# Patient Record
Sex: Male | Born: 1955 | Race: White | Hispanic: No | Marital: Married | Smoking: Current every day smoker
Health system: Southern US, Community
[De-identification: ages and names within clinical notes are randomized; demographics above are authoritative.]

## PROBLEM LIST (undated history)

## (undated) DIAGNOSIS — N341 Nonspecific urethritis: Secondary | ICD-10-CM

## (undated) DIAGNOSIS — Z860100 Personal history of colon polyps, unspecified: Secondary | ICD-10-CM

## (undated) DIAGNOSIS — E039 Hypothyroidism, unspecified: Secondary | ICD-10-CM

## (undated) DIAGNOSIS — Z720 Tobacco use: Secondary | ICD-10-CM

## (undated) DIAGNOSIS — K219 Gastro-esophageal reflux disease without esophagitis: Secondary | ICD-10-CM

## (undated) DIAGNOSIS — E119 Type 2 diabetes mellitus without complications: Secondary | ICD-10-CM

## (undated) DIAGNOSIS — E785 Hyperlipidemia, unspecified: Secondary | ICD-10-CM

## (undated) DIAGNOSIS — J449 Chronic obstructive pulmonary disease, unspecified: Secondary | ICD-10-CM

## (undated) HISTORY — DX: Tobacco use: Z72.0

## (undated) HISTORY — PX: TOOTH EXTRACTION: SUR596

## (undated) HISTORY — PX: HERNIA REPAIR: SHX51

## (undated) HISTORY — DX: Hyperlipidemia, unspecified: E78.5

## (undated) HISTORY — DX: Hypothyroidism, unspecified: E03.9

## (undated) HISTORY — DX: Gastro-esophageal reflux disease without esophagitis: K21.9

## (undated) HISTORY — DX: Personal history of colonic polyps: Z86.010

## (undated) HISTORY — DX: Personal history of colon polyps, unspecified: Z86.0100

## (undated) HISTORY — DX: Nonspecific urethritis: N34.1

---

## 2005-05-05 HISTORY — PX: OTHER SURGICAL HISTORY: SHX169

## 2006-05-05 HISTORY — PX: COLONOSCOPY: SHX174

## 2009-07-20 ENCOUNTER — Ambulatory Visit: Payer: Self-pay | Admitting: Internal Medicine

## 2009-07-20 DIAGNOSIS — E785 Hyperlipidemia, unspecified: Secondary | ICD-10-CM

## 2009-07-20 DIAGNOSIS — Z860100 Personal history of colon polyps, unspecified: Secondary | ICD-10-CM | POA: Insufficient documentation

## 2009-07-20 DIAGNOSIS — E039 Hypothyroidism, unspecified: Secondary | ICD-10-CM

## 2009-10-12 ENCOUNTER — Ambulatory Visit: Payer: Self-pay | Admitting: Internal Medicine

## 2009-10-12 DIAGNOSIS — F458 Other somatoform disorders: Secondary | ICD-10-CM

## 2009-10-12 DIAGNOSIS — N341 Nonspecific urethritis: Secondary | ICD-10-CM

## 2009-10-12 HISTORY — DX: Nonspecific urethritis: N34.1

## 2009-10-12 LAB — CONVERTED CEMR LAB
Glucose, Urine, Semiquant: NEGATIVE
Ketones, urine, test strip: NEGATIVE
Nitrite: NEGATIVE
Specific Gravity, Urine: >=1.03
pH: 5

## 2010-01-14 ENCOUNTER — Ambulatory Visit: Payer: Self-pay | Admitting: Internal Medicine

## 2010-01-14 LAB — CONVERTED CEMR LAB
Albumin: 4.2 g/dL (ref 3.5–5.2)
Alkaline Phosphatase: 42 units/L (ref 39–117)
Basophils Relative: 0.8 % (ref 0.0–3.0)
CO2: 28 meq/L (ref 19–32)
Chloride: 101 meq/L (ref 96–112)
Direct LDL: 106.2 mg/dL
Eosinophils Absolute: 0.3 10*3/uL (ref 0.0–0.7)
Glucose, Bld: 98 mg/dL (ref 70–99)
HCT: 46.3 % (ref 39.0–52.0)
Hemoglobin: 15.9 g/dL (ref 13.0–17.0)
Lymphocytes Relative: 19.7 % (ref 12.0–46.0)
Lymphs Abs: 1.4 10*3/uL (ref 0.7–4.0)
MCHC: 34.4 g/dL (ref 30.0–36.0)
MCV: 92.5 fL (ref 78.0–100.0)
Monocytes Absolute: 0.5 10*3/uL (ref 0.1–1.0)
Neutro Abs: 4.8 10*3/uL (ref 1.4–7.7)
Nitrite: NEGATIVE
Potassium: 4.4 meq/L (ref 3.5–5.1)
RBC: 5 M/uL (ref 4.22–5.81)
Sodium: 139 meq/L (ref 135–145)
Specific Gravity, Urine: 1.025
TSH: 3.1 microintl units/mL (ref 0.35–5.50)
Total CHOL/HDL Ratio: 5
Total Protein: 6.7 g/dL (ref 6.0–8.3)
Urobilinogen, UA: 0.2

## 2010-01-21 ENCOUNTER — Ambulatory Visit: Payer: Self-pay | Admitting: Internal Medicine

## 2010-01-21 DIAGNOSIS — K219 Gastro-esophageal reflux disease without esophagitis: Secondary | ICD-10-CM

## 2010-06-04 NOTE — Assessment & Plan Note (Signed)
Summary: CPX // RS   Vital Signs:  Patient profile:   55 year old male Height:      72 inches Weight:      222 pounds BMI:     30.22 Temp:     98.1 degrees F oral BP sitting:   108 / 70  (left arm) Cuff size:   regular  Vitals Entered By: Duard Brady LPN (January 21, 2010 7:57 AM) CC: cpx - doing well Is Patient Diabetic? No   CC:  cpx - doing well.  History of Present Illness: 55 year old patient who is seen today for a wellness exam.  Medical problems include dyslipidemia, hypothyroidism, and a history of colonic polyps.  He is a well today except for some intermittent reflux symptoms.  He has been treated with protonic since the past.  Allergies (verified): No Known Drug Allergies  Past History:  Past Medical History: Hyperlipidemia Hypothyroidism tobacco abuse Colonic polyps, hx of GERD  Past Surgical History: Reviewed history from 07/20/2009 and no changes required. colonoscopy 2008 ETT 2007  Family History: Reviewed history from 07/20/2009 and no changes required. father age 6, coronary artery disease, status post stent mother, age 64, history of cardiac surgery, possibly for a congenital heart defect 17 year old brother in good health one half-sister in good health  Social History: Reviewed history from 07/20/2009 and no changes required. Married Current Smoker three  children-   Review of Systems  The patient denies anorexia, fever, weight loss, weight gain, vision loss, decreased hearing, hoarseness, chest pain, syncope, dyspnea on exertion, peripheral edema, prolonged cough, headaches, hemoptysis, abdominal pain, melena, hematochezia, severe indigestion/heartburn, hematuria, incontinence, genital sores, muscle weakness, suspicious skin lesions, transient blindness, difficulty walking, depression, unusual weight change, abnormal bleeding, enlarged lymph nodes, angioedema, breast masses, and testicular masses.    Physical Exam  General:   Well-developed,well-nourished,in no acute distress; alert,appropriate and cooperative throughout examination Head:  Normocephalic and atraumatic without obvious abnormalities. No apparent alopecia or balding. Eyes:  No corneal or conjunctival inflammation noted. EOMI. Perrla. Funduscopic exam benign, without hemorrhages, exudates or papilledema. Vision grossly normal. Ears:  External ear exam shows no significant lesions or deformities.  Otoscopic examination reveals clear canals, tympanic membranes are intact bilaterally without bulging, retraction, inflammation or discharge. Hearing is grossly normal bilaterally. Nose:  External nasal examination shows no deformity or inflammation. Nasal mucosa are pink and moist without lesions or exudates. Mouth:  Oral mucosa and oropharynx without lesions or exudates.  Teeth in good repair. Neck:  No deformities, masses, or tenderness noted. Chest Wall:  No deformities, masses, tenderness or gynecomastia noted. Breasts:  No masses or gynecomastia noted Lungs:  Normal respiratory effort, chest expands symmetrically. Lungs are clear to auscultation, no crackles or wheezes. Heart:  Normal rate and regular rhythm. S1 and S2 normal without gallop, murmur, click, rub or other extra sounds. Abdomen:  Bowel sounds positive,abdomen soft and non-tender without masses, organomegaly or hernias noted. Rectal:  No external abnormalities noted. Normal sphincter tone. No rectal masses or tenderness. Genitalia:  Testes bilaterally descended without nodularity, tenderness or masses. No scrotal masses or lesions. No penis lesions or urethral discharge. Prostate:  Prostate gland firm and smooth, no enlargement, nodularity, tenderness, mass, asymmetry or induration. Msk:  No deformity or scoliosis noted of thoracic or lumbar spine.   Pulses:  R and L carotid,radial,femoral,dorsalis pedis and posterior tibial pulses are full and equal bilaterally Extremities:  No clubbing, cyanosis,  edema, or deformity noted with normal full range of motion of all  joints.   Neurologic:  No cranial nerve deficits noted. Station and gait are normal. Plantar reflexes are down-going bilaterally. DTRs are symmetrical throughout. Sensory, motor and coordinative functions appear intact. Skin:  Intact without suspicious lesions or rashes Cervical Nodes:  No lymphadenopathy noted Axillary Nodes:  No palpable lymphadenopathy Inguinal Nodes:  No significant adenopathy Psych:  Cognition and judgment appear intact. Alert and cooperative with normal attention span and concentration. No apparent delusions, illusions, hallucinations   Impression & Recommendations:  Problem # 1:  PREVENTIVE HEALTH CARE (ICD-V70.0)  Complete Medication List: 1)  Simvastatin 40 Mg Tabs (Simvastatin) .... Qd 2)  Levothyroxine Sodium 137 Mcg Tabs (Levothyroxine sodium) .... Qd 3)  Pantoprazole Sodium 40 Mg Tbec (Pantoprazole sodium) .... One daily, as needed for reflux symptoms  Patient Instructions: 1)  Please schedule a follow-up appointment in 1 year. 2)  Limit your Sodium (Salt) to less than 2 grams a day(slightly less than 1/2 a teaspoon) to prevent fluid retention, swelling, or worsening of symptoms. 3)  It is important that you exercise regularly at least 20 minutes 5 times a week. If you develop chest pain, have severe difficulty breathing, or feel very tired , stop exercising immediately and seek medical attention. Prescriptions: PANTOPRAZOLE SODIUM 40 MG TBEC (PANTOPRAZOLE SODIUM) one daily, as needed for reflux symptoms  #90 x 6   Entered and Authorized by:   Gordy Savers  MD   Signed by:   Gordy Savers  MD on 01/21/2010   Method used:   Print then Give to Patient   RxID:   [id number] LEVOTHYROXINE SODIUM 137 MCG TABS (LEVOTHYROXINE SODIUM) qd  #90 x 6   Entered and Authorized by:   Gordy Savers  MD   Signed by:   Gordy Savers  MD on 01/21/2010   Method used:   Print then  Give to Patient   RxID:   [id number] SIMVASTATIN 40 MG TABS (SIMVASTATIN) qd  #90 x 6   Entered and Authorized by:   Gordy Savers  MD   Signed by:   Gordy Savers  MD on 01/21/2010   Method used:   Print then Give to Patient   RxID:   [id number] PANTOPRAZOLE SODIUM 40 MG TBEC (PANTOPRAZOLE SODIUM) one daily, as needed for reflux symptoms  #90 x 6   Entered and Authorized by:   Gordy Savers  MD   Signed by:   Gordy Savers  MD on 01/21/2010   Method used:   [facility]ron[facility] to        [facility] [phone][facility])       [address]       [place], [place]  [address]       Ph: [phone] or [phone]       Fax: [phone]   RxID:   [id number] LEVOTHYROXINE SODIUM 137 MCG TABS (LEVOTHYROXINE SODIUM) qd  #90 x 6   Entered and Authorized by:   Gordy Savers  MD   Signed by:   Gordy Savers  MD on 01/21/2010   Method used:   [facility] to        [facility] [phone]* (retail)       [address]       [place], [place]  [address]       Ph: [phone] or [phone]  Fax: [phone]   RxID:   [id number] SIMVASTATIN 40 MG TABS (SIMVASTATIN) qd  #90 x 6   Entered and Authorized by:   Gordy Savers  MD   Signed by:   Gordy Savers  MD on 01/21/2010   Method used:   [facility]        [facility] [phone]* (retail)       [address]       [place], [place]  [address]       Ph: [phone] or [phone]       Fax: [phone]   RxID:   [id number]

## 2010-06-04 NOTE — Assessment & Plan Note (Signed)
Summary: TO BE EST/NJR   Vital Signs:  Patient profile:   55 year old male Height:      72 inches Weight:      215 pounds BMI:     29.26 Temp:     97.8 degrees F oral BP sitting:   120 / 74  (right arm) Cuff size:   regular  Vitals Entered By: Duard Brady LPN (July 20, 2009 1:02 PM) CC: new pt to establish - moved from [place] Is Patient Diabetic? No   CC:  new pt to establish - moved from [place].  History of Present Illness: 55 year old patient who is seen today for an initial examinationthe medical pons include hypothyroidism and dyslipidemia.  He has ongoing tobacco use.  No concerns or complaints today  Preventive Screening-Counseling & Management  Alcohol-Tobacco     Smoking Status: current     Smoking Cessation Counseling: yes  Allergies (verified): No Known Drug Allergies  Past History:  Past Medical History: Hyperlipidemia Hypothyroidism tobacco abuse Colonic polyps, hx of  Past Surgical History: colonoscopy 2008 ETT 2007  Family History: Reviewed history and no changes required. father age 64, coronary artery disease, status post stent mother, age 73, history of cardiac surgery, possibly for a congenital and mild to 57 year old brother in good health one half-sister in good health  Social History: Reviewed history and no changes required. Married Current Smoker two childrenSmoking Status:  current  Review of Systems  The patient denies anorexia, fever, weight loss, weight gain, vision loss, decreased hearing, hoarseness, chest pain, syncope, dyspnea on exertion, peripheral edema, prolonged cough, headaches, hemoptysis, abdominal pain, melena, hematochezia, severe indigestion/heartburn, hematuria, incontinence, genital sores, muscle weakness, suspicious skin lesions, transient blindness, difficulty walking, depression, unusual weight change, abnormal bleeding, enlarged lymph nodes, angioedema, breast masses, and testicular masses.     Physical Exam  General:  overweight-appearing.  normal blood pressureoverweight-appearing.   Head:  Normocephalic and atraumatic without obvious abnormalities. No apparent alopecia or balding. Eyes:  No corneal or conjunctival inflammation noted. EOMI. Perrla. Funduscopic exam benign, without hemorrhages, exudates or papilledema. Vision grossly normal. Ears:  External ear exam shows no significant lesions or deformities.  Otoscopic examination reveals clear canals, tympanic membranes are intact bilaterally without bulging, retraction, inflammation or discharge. Hearing is grossly normal bilaterally. Nose:  External nasal examination shows no deformity or inflammation. Nasal mucosa are pink and moist without lesions or exudates. Mouth:  Oral mucosa and oropharynx without lesions or exudates.  Teeth in good repair. Neck:  No deformities, masses, or tenderness noted. Chest Wall:  No deformities, masses, tenderness or gynecomastia noted. Breasts:  No masses or gynecomastia noted Lungs:  Normal respiratory effort, chest expands symmetrically. Lungs are clear to auscultation, no crackles or wheezes. Heart:  Normal rate and regular rhythm. S1 and S2 normal without gallop, murmur, click, rub or other extra sounds. Abdomen:  Bowel sounds positive,abdomen soft and non-tender without masses, organomegaly or hernias noted. Genitalia:  Testes bilaterally descended without nodularity, tenderness or masses. No scrotal masses or lesions. No penis lesions or urethral discharge. Msk:  tattoos both upper arms Pulses:  R and L carotid,radial,femoral,dorsalis pedis and posterior tibial pulses are full and equal bilaterally Extremities:  No clubbing, cyanosis, edema, or deformity noted with normal full range of motion of all joints.   Skin:   tattoos both upper arms Cervical Nodes:  No lymphadenopathy noted Axillary Nodes:  No palpable lymphadenopathy Inguinal Nodes:  No significant adenopathy Psych:  Cognition  and judgment appear  intact. Alert and cooperative with normal attention span and concentration. No apparent delusions, illusions, hallucinations   Impression & Recommendations:  Problem # 1:  Preventive Health Care (ICD-V70.0)  Complete Medication List: 1)  Simvastatin 40 Mg Tabs (Simvastatin) .... Qd 2)  Levothyroxine Sodium 137 Mcg Tabs (Levothyroxine sodium) .... Qd  Patient Instructions: 1)  Please schedule a follow-up appointment in 6 months. 2)  Advised not to eat any food or drink any liquids after 10 PM the night before your procedure. 3)  Tobacco is very bad for your health and your loved ones! You Should stop smoking!. 4)  It is important that you exercise regularly at least 20 minutes 5 times a week. If you develop chest pain, have severe difficulty breathing, or feel very tired , stop exercising immediately and seek medical attention. 5)  You need to lose weight. Consider a lower calorie diet and regular exercise.  Prescriptions: LEVOTHYROXINE SODIUM 137 MCG TABS (LEVOTHYROXINE SODIUM) qd  #90 x 6   Entered and Authorized by:   Gordy Savers  MD   Signed by:   Gordy Savers  MD on 07/20/2009   Method used:   Print then Give to Patient   RxID:   [id number] SIMVASTATIN 40 MG TABS (SIMVASTATIN) qd  #90 x 6   Entered and Authorized by:   Gordy Savers  MD   Signed by:   Gordy Savers  MD on 07/20/2009   Method used:   Print then Give to Patient   RxID:   [id number]

## 2010-06-04 NOTE — Assessment & Plan Note (Signed)
Summary: uti/dm   Vital Signs:  Turner profile:   55 year old male Weight:      223 pounds Temp:     98.4 degrees F oral BP sitting:   120 / 84  (right arm) Cuff size:   regular  Vitals Entered By: Duard Brady LPN (October 12, 2009 4:09 PM) CC: c/o ??uti , irriatation to tip of penis Is Turner Diabetic? No   CC:  c/o ??uti  and irriatation to tip of penis.  History of Present Illness: Tracy Turner, who presents with a several-day history of irritation of the distal urethra associated with a scanty urethral discharge.  He has been in a monogamous relationship for over 20 years.  No prior history of STD, although he states he was treated for a UTI many years ago.  Denies any drug allergies.  He has hypothyroidism and dyslipidemia.  Preventive Screening-Counseling & Management  Alcohol-Tobacco     Smoking Status: current  Allergies (verified): No Known Drug Allergies  Past History:  Past Medical History: Reviewed history from 07/20/2009 and no changes required. Hyperlipidemia Hypothyroidism tobacco abuse Colonic polyps, hx of  Physical Exam  General:  Well-developed,well-nourished,in no acute distress; alert,appropriate and cooperative throughout examination; 120/84 Genitalia:  distal urethra, slightly erythematous with a scanty discharge   Impression & Recommendations:  Problem # 1:  NONGONOCOCCAL URETHRITIS (ICD-099.40)  Orders: Prescription Created Electronically [phone])  Problem # 2:  HYPERLIPIDEMIA (ICD-272.4)  His updated medication list for this problem includes:    Simvastatin 40 Mg Tabs (Simvastatin) ..... Qd  Complete Medication List: 1)  Simvastatin 40 Mg Tabs (Simvastatin) .... Qd 2)  Levothyroxine Sodium 137 Mcg Tabs (Levothyroxine sodium) .... Qd 3)  Doxycycline Hyclate 100 Mg Tabs (Doxycycline hyclate) .... One twice daily  Other Orders: UA Dipstick w/o Micro (manual) ([id number])  Turner Instructions: 1)  Please schedule a  follow-up appointment in 6 months. 2)  Take your antibiotic as prescribed until ALL of it is gone, but stop if you develop a rash or swelling and contact our office as soon as possible. Prescriptions: DOXYCYCLINE HYCLATE 100 MG TABS (DOXYCYCLINE HYCLATE) one twice daily  #20 x 0   Entered and Authorized by:   Gordy Savers  MD   Signed by:   Gordy Savers  MD on 10/12/2009   Method used:   Print then Give to Turner   RxID:   [id number] DOXYCYCLINE HYCLATE 100 MG TABS (DOXYCYCLINE HYCLATE) one twice daily  #20 x 0   Entered and Authorized by:   Gordy Savers  MD   Signed by:   Gordy Savers  MD on 10/12/2009   Method used:   [facility]        [phone][facility] (retail[facility]       [address]       [place], [place]  [address]       Ph: [phone] or [phone]       Fax: [phone]   RxID:   [id number]   Laboratory Results   Urine Tests    Routine Urinalysis   Color: yellow Appearance: Hazy Glucose: negative   (Normal Range: Negative) Bilirubin: negative   (Normal Range: Negative) Ketone: negative   (Normal Range: Negative) Spec. Gravity: >=1.030   (Normal Range: 1.003-1.035) Blood: trace-lysed   (Normal Range: Negative) pH: 5.0   (Normal Range: 5.0-8.0) Protein: trace   (Normal Range: Negative) Urobilinogen: 0.2   (Normal Range: 0-1) Nitrite: negative   (Normal Range:  Negative) Leukocyte Esterace: trace   (Normal Range: Negative)

## 2011-02-03 ENCOUNTER — Other Ambulatory Visit: Payer: Self-pay | Admitting: Internal Medicine

## 2011-02-10 ENCOUNTER — Other Ambulatory Visit: Payer: Self-pay

## 2011-02-10 MED ORDER — PANTOPRAZOLE SODIUM 40 MG PO TBEC: 40.0000 mg | DELAYED_RELEASE_TABLET | Freq: Every day | ORAL | Status: DC

## 2011-02-10 MED ORDER — SIMVASTATIN 40 MG PO TABS: 40.0000 mg | ORAL_TABLET | Freq: Every evening | ORAL | Status: DC

## 2011-03-19 ENCOUNTER — Encounter: Payer: Self-pay | Admitting: Internal Medicine

## 2011-03-20 ENCOUNTER — Ambulatory Visit (INDEPENDENT_AMBULATORY_CARE_PROVIDER_SITE_OTHER): Payer: 59 | Admitting: Internal Medicine

## 2011-03-20 ENCOUNTER — Encounter: Payer: Self-pay | Admitting: Internal Medicine

## 2011-03-20 DIAGNOSIS — E785 Hyperlipidemia, unspecified: Secondary | ICD-10-CM

## 2011-03-20 DIAGNOSIS — K219 Gastro-esophageal reflux disease without esophagitis: Secondary | ICD-10-CM

## 2011-03-20 DIAGNOSIS — E039 Hypothyroidism, unspecified: Secondary | ICD-10-CM

## 2011-03-20 MED ORDER — PANTOPRAZOLE SODIUM 40 MG PO TBEC: 40.0000 mg | DELAYED_RELEASE_TABLET | Freq: Every day | ORAL | Status: DC

## 2011-03-20 MED ORDER — LEVOTHYROXINE SODIUM 137 MCG PO TABS: 137.0000 ug | ORAL_TABLET | Freq: Every day | ORAL | Status: DC

## 2011-03-20 MED ORDER — SIMVASTATIN 40 MG PO TABS: 40.0000 mg | ORAL_TABLET | Freq: Every evening | ORAL | Status: DC

## 2011-03-20 NOTE — Patient Instructions (Signed)
Limit your sodium (Salt) intake  Avoids foods high in acid such as tomatoes citrus juices, and spicy foods.  Avoid eating within two hours of lying down or before exercising.  Do not overheat.  Try smaller more frequent meals.  If symptoms persist, elevate the head of her bed 12 inches while sleeping.  Annual exam in January as scheduled

## 2011-03-20 NOTE — Progress Notes (Signed)
  Subjective:    Patient ID: Tracy Turner, male    DOB: [birth year], 55 y.o.   MRN: [id number]  HPI  55 year old patient who is seen today for followup. He has a history of dyslipidemia hypothyroidism and gastroesophageal reflux disease. He has been scheduled for a physical early next year. He also has a history of colonic polyps and will require followup colonoscopy at that time he is doing quite well today and is seen today basically for a medication refill. He has had a flu vaccine at work.    Review of Systems  Constitutional: Negative for fever, chills, appetite change and fatigue.  HENT: Negative for hearing loss, ear pain, congestion, sore throat, trouble swallowing, neck stiffness, dental problem, voice change and tinnitus.   Eyes: Negative for pain, discharge and visual disturbance.  Respiratory: Negative for cough, chest tightness, wheezing and stridor.   Cardiovascular: Negative for chest pain, palpitations and leg swelling.  Gastrointestinal: Negative for nausea, vomiting, abdominal pain, diarrhea, constipation, blood in stool and abdominal distention.  Genitourinary: Negative for urgency, hematuria, flank pain, discharge, difficulty urinating and genital sores.  Musculoskeletal: Negative for myalgias, back pain, joint swelling, arthralgias and gait problem.  Skin: Negative for rash.  Neurological: Negative for dizziness, syncope, speech difficulty, weakness, numbness and headaches.  Hematological: Negative for adenopathy. Does not bruise/bleed easily.  Psychiatric/Behavioral: Negative for behavioral problems and dysphoric mood. The patient is not nervous/anxious.        Objective:   Physical Exam  Constitutional: He is oriented to person, place, and time. He appears well-developed.  HENT:  Head: Normocephalic.  Right Ear: External ear normal.  Left Ear: External ear normal.  Eyes: Conjunctivae and EOM are normal.  Neck: Normal range of motion.  Cardiovascular: Normal rate  and normal heart sounds.   Pulmonary/Chest: Breath sounds normal.  Abdominal: Bowel sounds are normal.  Musculoskeletal: Normal range of motion. He exhibits no edema and no tenderness.  Neurological: He is alert and oriented to person, place, and time.  Psychiatric: He has a normal mood and affect. His behavior is normal.          Assessment & Plan:   Hypothyroidism. We'll check a TSH at that time his physical Dyslipidemia stable followup lipid panel at the time of his physical Gastroesophageal reflux disease stable. Medications refilled

## 2011-04-24 ENCOUNTER — Other Ambulatory Visit: Payer: Self-pay | Admitting: Internal Medicine

## 2011-05-08 ENCOUNTER — Encounter: Payer: 59 | Admitting: Internal Medicine

## 2011-05-12 ENCOUNTER — Other Ambulatory Visit (INDEPENDENT_AMBULATORY_CARE_PROVIDER_SITE_OTHER): Payer: 59

## 2011-05-12 DIAGNOSIS — Z0000 Encounter for general adult medical examination without abnormal findings: Secondary | ICD-10-CM

## 2011-05-12 LAB — LDL CHOLESTEROL, DIRECT: Direct LDL: 95.7 mg/dL

## 2011-05-12 LAB — POCT URINALYSIS DIPSTICK
Glucose, UA: NEGATIVE
Nitrite, UA: NEGATIVE
Spec Grav, UA: 1.025
Urobilinogen, UA: 0.2

## 2011-05-12 LAB — CBC WITH DIFFERENTIAL/PLATELET
Basophils Relative: 0.4 % (ref 0.0–3.0)
Eosinophils Relative: 3.1 % (ref 0.0–5.0)
HCT: 47.7 % (ref 39.0–52.0)
Hemoglobin: 16.2 g/dL (ref 13.0–17.0)
Lymphs Abs: 1.4 10*3/uL (ref 0.7–4.0)
MCV: 92.8 fl (ref 78.0–100.0)
Monocytes Absolute: 0.4 10*3/uL (ref 0.1–1.0)
Neutro Abs: 5.7 10*3/uL (ref 1.4–7.7)
RBC: 5.14 Mil/uL (ref 4.22–5.81)
WBC: 7.8 10*3/uL (ref 4.5–10.5)

## 2011-05-12 LAB — BASIC METABOLIC PANEL
CO2: 29 mEq/L (ref 19–32)
Calcium: 10.1 mg/dL (ref 8.4–10.5)
Creatinine, Ser: 1.2 mg/dL (ref 0.4–1.5)

## 2011-05-12 LAB — LIPID PANEL
Cholesterol: 171 mg/dL (ref 0–200)
Total CHOL/HDL Ratio: 4

## 2011-05-12 LAB — TSH: TSH: 3.71 u[IU]/mL (ref 0.35–5.50)

## 2011-05-13 LAB — HEPATIC FUNCTION PANEL
ALT: 30 U/L (ref 0–53)
Albumin: 4.5 g/dL (ref 3.5–5.2)
Bilirubin, Direct: 0.1 mg/dL (ref 0.0–0.3)
Total Protein: 7.1 g/dL (ref 6.0–8.3)

## 2011-05-19 ENCOUNTER — Encounter: Payer: Self-pay | Admitting: Internal Medicine

## 2011-05-19 ENCOUNTER — Ambulatory Visit (INDEPENDENT_AMBULATORY_CARE_PROVIDER_SITE_OTHER): Payer: 59 | Admitting: Internal Medicine

## 2011-05-19 DIAGNOSIS — Z860100 Personal history of colon polyps, unspecified: Secondary | ICD-10-CM

## 2011-05-19 DIAGNOSIS — R7309 Other abnormal glucose: Secondary | ICD-10-CM

## 2011-05-19 DIAGNOSIS — E785 Hyperlipidemia, unspecified: Secondary | ICD-10-CM

## 2011-05-19 DIAGNOSIS — R7302 Impaired glucose tolerance (oral): Secondary | ICD-10-CM

## 2011-05-19 DIAGNOSIS — E039 Hypothyroidism, unspecified: Secondary | ICD-10-CM

## 2011-05-19 MED ORDER — SIMVASTATIN 40 MG PO TABS: 40.0000 mg | ORAL_TABLET | Freq: Every day | ORAL | Status: DC

## 2011-05-19 MED ORDER — VARENICLINE TARTRATE 0.5 MG PO TABS: 0.5000 mg | ORAL_TABLET | Freq: Two times a day (BID) | ORAL | Status: AC

## 2011-05-19 MED ORDER — LEVOTHYROXINE SODIUM 137 MCG PO TABS: 137.0000 ug | ORAL_TABLET | Freq: Every day | ORAL | Status: DC

## 2011-05-19 MED ORDER — PANTOPRAZOLE SODIUM 40 MG PO TBEC: 40.0000 mg | DELAYED_RELEASE_TABLET | Freq: Every day | ORAL | Status: DC

## 2011-05-19 NOTE — Progress Notes (Signed)
  Subjective:    Patient ID: Tracy Turner, male    DOB: 24-Jul-1955, 56 y.o.   MRN: [id number]  HPI  56 year old patient who is seen today for a health maintenance examination. He has a history of dyslipidemia ongoing tobacco use and gastroesophageal reflux disease. He also had multiple colonic polyps in 2007. He has treated hypothyroidism. He has a history of ongoing tobacco use and has been successful with discontinuation with Chantix therapy in the past    Review of Systems  Constitutional: Negative for fever, chills, activity change, appetite change and fatigue.  HENT: Negative for hearing loss, ear pain, congestion, rhinorrhea, sneezing, mouth sores, trouble swallowing, neck pain, neck stiffness, dental problem, voice change, sinus pressure and tinnitus.   Eyes: Negative for photophobia, pain, redness and visual disturbance.  Respiratory: Negative for apnea, cough, choking, chest tightness, shortness of breath and wheezing.   Cardiovascular: Negative for chest pain, palpitations and leg swelling.  Gastrointestinal: Negative for nausea, vomiting, abdominal pain, diarrhea, constipation, blood in stool, abdominal distention, anal bleeding and rectal pain.  Genitourinary: Negative for dysuria, urgency, frequency, hematuria, flank pain, decreased urine volume, discharge, penile swelling, scrotal swelling, difficulty urinating, genital sores and testicular pain.  Musculoskeletal: Negative for myalgias, back pain, joint swelling, arthralgias and gait problem.  Skin: Negative for color change, rash and wound.  Neurological: Negative for dizziness, tremors, seizures, syncope, facial asymmetry, speech difficulty, weakness, light-headedness, numbness and headaches.  Hematological: Negative for adenopathy. Does not bruise/bleed easily.  Psychiatric/Behavioral: Negative for suicidal ideas, hallucinations, behavioral problems, confusion, sleep disturbance, self-injury, dysphoric mood, decreased  concentration and agitation. The patient is not nervous/anxious.        Objective:   Physical Exam  Constitutional: He appears well-developed and well-nourished.  HENT:  Head: Normocephalic and atraumatic.  Right Ear: External ear normal.  Left Ear: External ear normal.  Nose: Nose normal.  Mouth/Throat: Oropharynx is clear and moist.  Eyes: Conjunctivae and EOM are normal. Pupils are equal, round, and reactive to light. No scleral icterus.  Neck: Normal range of motion. Neck supple. No JVD present. No thyromegaly present.  Cardiovascular: Regular rhythm, normal heart sounds and intact distal pulses.  Exam reveals no gallop and no friction rub.   No murmur heard. Pulmonary/Chest: Effort normal and breath sounds normal. He exhibits no tenderness.  Abdominal: Soft. Bowel sounds are normal. He exhibits no distension and no mass. There is no tenderness.  Genitourinary: Prostate normal and penis normal.  Musculoskeletal: Normal range of motion. He exhibits no edema and no tenderness.  Lymphadenopathy:    He has no cervical adenopathy.  Neurological: He is alert. He has normal reflexes. No cranial nerve deficit. Coordination normal.  Skin: Skin is warm and dry. No rash noted.  Psychiatric: He has a normal mood and affect. His behavior is normal.          Assessment & Plan:   Preventive health examination Ongoing tobacco use. Will retreat with Chantix Dyslipidemia will continue on Zocor Impaired glucose tolerance weight loss exercise encouraged Clinical polyps. We'll schedule followup colonoscopy

## 2011-05-19 NOTE — Patient Instructions (Signed)
Smoking tobacco is very bad for your health. You should stop smoking immediately.  Limit your sodium (Salt) intake    It is important that you exercise regularly, at least 20 minutes 3 to 4 times per week.  If you develop chest pain or shortness of breath seek  medical attention.  Schedule your colonoscopy to help detect colon cancer.

## 2012-02-24 ENCOUNTER — Other Ambulatory Visit: Payer: Self-pay | Admitting: Internal Medicine

## 2012-03-06 ENCOUNTER — Other Ambulatory Visit: Payer: Self-pay | Admitting: Internal Medicine

## 2012-04-09 ENCOUNTER — Other Ambulatory Visit: Payer: Self-pay | Admitting: Internal Medicine

## 2012-05-30 ENCOUNTER — Other Ambulatory Visit: Payer: Self-pay | Admitting: Internal Medicine

## 2012-07-20 ENCOUNTER — Other Ambulatory Visit (INDEPENDENT_AMBULATORY_CARE_PROVIDER_SITE_OTHER): Payer: 59

## 2012-07-20 DIAGNOSIS — Z0000 Encounter for general adult medical examination without abnormal findings: Secondary | ICD-10-CM

## 2012-07-20 LAB — LDL CHOLESTEROL, DIRECT: Direct LDL: 80.8 mg/dL

## 2012-07-20 LAB — CBC WITH DIFFERENTIAL/PLATELET
Eosinophils Relative: 2.5 % (ref 0.0–5.0)
HCT: 45.6 % (ref 39.0–52.0)
Hemoglobin: 15.4 g/dL (ref 13.0–17.0)
Lymphocytes Relative: 18.8 % (ref 12.0–46.0)
Lymphs Abs: 1.4 10*3/uL (ref 0.7–4.0)
Monocytes Relative: 6.1 % (ref 3.0–12.0)
Neutro Abs: 5.3 10*3/uL (ref 1.4–7.7)
WBC: 7.4 10*3/uL (ref 4.5–10.5)

## 2012-07-20 LAB — POCT URINALYSIS DIPSTICK
Ketones, UA: NEGATIVE
Leukocytes, UA: NEGATIVE
Nitrite, UA: NEGATIVE
Protein, UA: NEGATIVE
pH, UA: 5.5

## 2012-07-20 LAB — LIPID PANEL
HDL: 31.9 mg/dL — ABNORMAL LOW (ref >39.00)
Total CHOL/HDL Ratio: 5
Triglycerides: 288 mg/dL — ABNORMAL HIGH (ref 0.0–149.0)

## 2012-07-20 LAB — BASIC METABOLIC PANEL
CO2: 25 mEq/L (ref 19–32)
Calcium: 9.5 mg/dL (ref 8.4–10.5)
Chloride: 104 mEq/L (ref 96–112)
Creatinine, Ser: 1.1 mg/dL (ref 0.4–1.5)
Glucose, Bld: 132 mg/dL — ABNORMAL HIGH (ref 70–99)
Sodium: 136 mEq/L (ref 135–145)

## 2012-07-20 LAB — PSA: PSA: 0.44 ng/mL (ref 0.10–4.00)

## 2012-07-20 LAB — HEPATIC FUNCTION PANEL
AST: 23 U/L (ref 0–37)
Total Bilirubin: 0.7 mg/dL (ref 0.3–1.2)

## 2012-07-27 ENCOUNTER — Encounter: Payer: Self-pay | Admitting: Internal Medicine

## 2012-07-27 ENCOUNTER — Ambulatory Visit (INDEPENDENT_AMBULATORY_CARE_PROVIDER_SITE_OTHER): Payer: 59 | Admitting: Internal Medicine

## 2012-07-27 VITALS — BP 126/90 | HR 91 | Temp 98.4°F | Resp 18 | Ht 71.5 in | Wt 225.0 lb

## 2012-07-27 DIAGNOSIS — R7309 Other abnormal glucose: Secondary | ICD-10-CM

## 2012-07-27 DIAGNOSIS — E785 Hyperlipidemia, unspecified: Secondary | ICD-10-CM

## 2012-07-27 DIAGNOSIS — E039 Hypothyroidism, unspecified: Secondary | ICD-10-CM

## 2012-07-27 DIAGNOSIS — K219 Gastro-esophageal reflux disease without esophagitis: Secondary | ICD-10-CM

## 2012-07-27 DIAGNOSIS — Z0000 Encounter for general adult medical examination without abnormal findings: Secondary | ICD-10-CM

## 2012-07-27 DIAGNOSIS — Z86010 Personal history of colonic polyps: Secondary | ICD-10-CM

## 2012-07-27 DIAGNOSIS — R7302 Impaired glucose tolerance (oral): Secondary | ICD-10-CM

## 2012-07-27 MED ORDER — LEVOTHYROXINE SODIUM 137 MCG PO TABS: ORAL_TABLET | ORAL | Status: DC

## 2012-07-27 MED ORDER — PANTOPRAZOLE SODIUM 40 MG PO TBEC: DELAYED_RELEASE_TABLET | ORAL | Status: DC

## 2012-07-27 MED ORDER — VARENICLINE TARTRATE 1 MG PO TABS: 1.0000 mg | ORAL_TABLET | Freq: Two times a day (BID) | ORAL | Status: DC

## 2012-07-27 MED ORDER — SIMVASTATIN 40 MG PO TABS: ORAL_TABLET | ORAL | Status: DC

## 2012-07-27 MED ORDER — VARENICLINE TARTRATE 0.5 MG X 11 & 1 MG X 42 PO MISC: ORAL | Status: DC

## 2012-07-27 NOTE — Progress Notes (Signed)
Patient ID: Tracy Turner, male   DOB: [birth year], 56 y.o.   MRN: [id number]  Subjective:    Patient ID: Tracy Turner, male    DOB: [birth year], 57 y.o.   MRN: [id number]  Hypertension Pertinent negatives include no chest pain, headaches, neck pain, palpitations or shortness of breath.  Hyperlipidemia Pertinent negatives include no chest pain, myalgias or shortness of breath.    57 -year-old patient who is seen today for a health maintenance examination. He has a history of dyslipidemia,  ongoing tobacco use and gastroesophageal reflux disease. He also had multiple colonic polyps in 2007. He has treated hypothyroidism. He has a history of ongoing tobacco use and has been successful with discontinuation with Chantix therapy in the past  Past Medical History  Diagnosis Date  . Hyperlipidemia   . Hypothyroidism   . Tobacco abuse   . History of colonic polyps   . GERD (gastroesophageal reflux disease)     History   Social History  . Marital Status: Married    Spouse Name: N/A    Number of Children: N/A  . Years of Education: N/A   Occupational History  . Not on file.   Social History Main Topics  . Smoking status: Current Every Day Smoker -- 0.50 packs/day    Types: Cigarettes  . Smokeless tobacco: Never Used  . Alcohol Use: Yes  . Drug Use: No  . Sexually Active: Not on file   Other Topics Concern  . Not on file   Social History Narrative  . No narrative on file    Past Surgical History  Procedure Laterality Date  . Colonoscopy  2008  . Ett  2007    Family History  Problem Relation Age of Onset  . Heart disease Father   . Healthy Sister   . Healthy Brother     No Known Allergies  Current Outpatient Prescriptions on File Prior to Visit  Medication Sig Dispense Refill  . levothyroxine (SYNTHROID, LEVOTHROID) 137 MCG tablet TAKE ONE TABLET BY MOUTH EVERY DAY (NEEDS  TO  BE  SEEN)  90 tablet  3  . pantoprazole (PROTONIX) 40 MG tablet TAKE ONE TABLET BY MOUTH  EVERY DAY (NEEDS  TO  BE  SEEN)  60 tablet  3  . simvastatin (ZOCOR) 40 MG tablet TAKE ONE TABLET BY MOUTH IN THE EVENING  90 tablet  3   No current facility-administered medications on file prior to visit.    BP 126/90  Pulse 91  Temp(Src) 98.4 F (36.9 C) (Oral)  Resp 18  Ht 5' 11.5" (1.816 m)  Wt 225 lb (102.059 kg)  BMI 30.95 kg/m2  SpO2 96%   Past Surgical History:  Reviewed history from 07/20/2009 and no changes required.  colonoscopy 2008  ETT 2007   Family History:  Reviewed history from 07/20/2009 and no changes required.  father age 56, coronary artery disease, status post stent  mother, age 56, history of cardiac surgery, possibly for a congenital heart defect  4 year old brother in good health  one half-sister in good health   Social History:  Reviewed history from 07/20/2009 and no changes required.  Married  Current Smoker  three children-      Review of Systems  Constitutional: Negative for fever, chills, activity change, appetite change and fatigue.  HENT: Negative for hearing loss, ear pain, congestion, rhinorrhea, sneezing, mouth sores, trouble swallowing, neck pain, neck stiffness, dental problem, voice change, sinus pressure and tinnitus.   Eyes: Negative for photophobia, pain,  redness and visual disturbance.  Respiratory: Negative for apnea, cough, choking, chest tightness, shortness of breath and wheezing.   Cardiovascular: Negative for chest pain, palpitations and leg swelling.  Gastrointestinal: Negative for nausea, vomiting, abdominal pain, diarrhea, constipation, blood in stool, abdominal distention, anal bleeding and rectal pain.  Genitourinary: Negative for dysuria, urgency, frequency, hematuria, flank pain, decreased urine volume, discharge, penile swelling, scrotal swelling, difficulty urinating, genital sores and testicular pain.  Musculoskeletal: Negative for myalgias, back pain, joint swelling, arthralgias and gait problem.  Skin:  Negative for color change, rash and wound.  Neurological: Negative for dizziness, tremors, seizures, syncope, facial asymmetry, speech difficulty, weakness, light-headedness, numbness and headaches.  Hematological: Negative for adenopathy. Does not bruise/bleed easily.  Psychiatric/Behavioral: Negative for suicidal ideas, hallucinations, behavioral problems, confusion, sleep disturbance, self-injury, dysphoric mood, decreased concentration and agitation. The patient is not nervous/anxious.        Objective:   Physical Exam  Constitutional: He appears well-developed and well-nourished.  HENT:  Head: Normocephalic and atraumatic.  Right Ear: External ear normal.  Left Ear: External ear normal.  Nose: Nose normal.  Mouth/Throat: Oropharynx is clear and moist.  Eyes: Conjunctivae and EOM are normal. Pupils are equal, round, and reactive to light. No scleral icterus.  Neck: Normal range of motion. Neck supple. No JVD present. No thyromegaly present.  Cardiovascular: Regular rhythm, normal heart sounds and intact distal pulses.  Exam reveals no gallop and no friction rub.   No murmur heard. Decreased right dorsalis pedis pulse  Pulmonary/Chest: Effort normal and breath sounds normal. He exhibits no tenderness.  Abdominal: Soft. Bowel sounds are normal. He exhibits no distension and no mass. There is no tenderness.  Genitourinary: Prostate normal and penis normal. Guaiac negative stool.  Musculoskeletal: Normal range of motion. He exhibits no edema and no tenderness.  Lymphadenopathy:    He has no cervical adenopathy.  Neurological: He is alert. He has normal reflexes. No cranial nerve deficit. Coordination normal.  Skin: Skin is warm and dry. No rash noted.  Psychiatric: He has a normal mood and affect. His behavior is normal.          Assessment & Plan:   Preventive health examination Ongoing tobacco use. Will retreat with Chantix Dyslipidemia will continue on Zocor Impaired  glucose tolerance weight loss exercise encouraged Clinical polyps. We'll schedule followup colonoscopy

## 2012-07-27 NOTE — Progress Notes (Signed)
  Subjective:    Patient ID: Tracy Turner, male    DOB: 20-Aug-1955, 57 y.o.   MRN: [id number]  HPI see prior note  Wt Readings from Last 3 Encounters:  07/27/12 225 lb (102.059 kg)  05/19/11 230 lb (104.327 kg)  03/20/11 230 lb (104.327 kg)    Review of Systems  Constitutional: Negative.        Objective:   Physical Exam  Constitutional: He is oriented to person, place, and time. He appears well-developed.  HENT:  Head: Normocephalic.  Right Ear: External ear normal.  Left Ear: External ear normal.  Eyes: Conjunctivae and EOM are normal.  Neck: Normal range of motion.  Cardiovascular: Normal rate and normal heart sounds.   Pulmonary/Chest: Breath sounds normal.  Abdominal: Bowel sounds are normal.  Musculoskeletal: Normal range of motion. He exhibits no edema and no tenderness.  Neurological: He is alert and oriented to person, place, and time.  Psychiatric: He has a normal mood and affect. His behavior is normal.          Assessment & Plan:  Preventive health

## 2012-07-27 NOTE — Patient Instructions (Signed)
Limit your sodium (Salt) intake    It is important that you exercise regularly, at least 20 minutes 3 to 4 times per week.  If you develop chest pain or shortness of breath seek  medical attention.  Return in one year for follow-up  Schedule your colonoscopy to help detect colon cancer.  You need to lose weight.  Consider a lower calorie diet and regular exercise.

## 2012-08-02 ENCOUNTER — Encounter: Payer: Self-pay | Admitting: Internal Medicine

## 2012-08-30 ENCOUNTER — Ambulatory Visit (AMBULATORY_SURGERY_CENTER): Payer: 59 | Admitting: *Deleted

## 2012-08-30 ENCOUNTER — Telehealth: Payer: Self-pay | Admitting: *Deleted

## 2012-08-30 VITALS — Ht 72.0 in | Wt 223.4 lb

## 2012-08-30 DIAGNOSIS — Z86010 Personal history of colonic polyps: Secondary | ICD-10-CM

## 2012-08-30 DIAGNOSIS — Z1211 Encounter for screening for malignant neoplasm of colon: Secondary | ICD-10-CM

## 2012-08-30 MED ORDER — MOVIPREP 100 G PO SOLR
ORAL | Status: DC
Start: 2012-08-30 — End: 2013-07-29

## 2012-08-30 NOTE — Progress Notes (Signed)
Patient states his last colonoscopy was done in 2008 in [place], he does not remember the dr.'s name. Patient states he had several polyps removed. Patient is going home to try to find out the dr.'s name. My phone # given to patient to call me back also release of information formed signed and explained to patient.

## 2012-08-30 NOTE — Progress Notes (Signed)
emmi information given to patient. 

## 2012-08-30 NOTE — Progress Notes (Signed)
Patient called back with name of MD. Yvonne Kendall. Release of information given to Stacey,CMA.

## 2012-08-30 NOTE — Telephone Encounter (Signed)
Patient for direct colonoscopy with Dr.Pyrtle on 09-13-12. Patient states his last colonoscopy was in [place] in 2008 with Dr.James Nackley [phone]. Patient states he did have several polyps removed and was told to repeat colonoscopy in 5 years. Release of information filled out and given to Stacey,CMA.

## 2012-08-31 ENCOUNTER — Encounter: Payer: Self-pay | Admitting: Internal Medicine

## 2012-09-03 ENCOUNTER — Telehealth: Payer: Self-pay

## 2012-09-03 ENCOUNTER — Encounter: Payer: Self-pay | Admitting: Internal Medicine

## 2012-09-03 NOTE — Telephone Encounter (Signed)
Per Dr Rhea Belton after reviewing faxed medical records from previous GI the pt does not need a colon until 01/03/2017.  Pt needs to be asked if any changes in family history or personal history if no changes recall to be put in the system for 01/03/2017.  Left message on machine to call back

## 2012-09-03 NOTE — Progress Notes (Signed)
Patient ID: Tracy Turner, male   DOB: [birth year], 57 y.o.   MRN: [id number]  Records received on patient Yoshiharu Brassell -- regarding previous GI procedures -- received from Dr. Terrilee Files, MD - [facility] [place] Colonoscopy performed on 01/18/2007 -- examination to the cecum -- single 4 mm polyp in the transverse colon, 4 sessile polyps 3-4 mm in the rectum and sigmoid - all removed with cold forceps.  Multiple diverticula in the sigmoid.  Grade 1 internal hemorrhoids Pathology results = transverse colon polyp-lymphoid aggregate, small. Sigmoid and rectal polyp-hyperplastic polyps. Lymphoid aggregates, small. The pathology result is signed indicating repeat colonoscopy due in 10 years (I agree with this recommendation of 10 years for followup screening colonoscopy as no adenomatous polyps were removed - this is assuming the patient does not have a family history of colorectal cancer in a first-degree relative occurring before age 60, which would change screening interval of 5 years)  Patient scheduled for colonoscopy next week. We will contact him with this information and recommend repeat screening colonoscopy in September 2018

## 2012-09-06 ENCOUNTER — Telehealth: Payer: Self-pay | Admitting: Gastroenterology

## 2012-09-06 NOTE — Telephone Encounter (Signed)
Left another message for patient to return my call.

## 2012-09-06 NOTE — Telephone Encounter (Signed)
Pt called back. Told me there are no changes regarding his family medical history. I told him then his colonoscopy will be scheduled for 01/03/2017 pt verbalized understanding.

## 2012-09-07 NOTE — Telephone Encounter (Signed)
Pt notified of new recall date and entered in Epic.

## 2012-09-13 ENCOUNTER — Encounter: Payer: 59 | Admitting: Internal Medicine

## 2013-04-04 ENCOUNTER — Encounter: Payer: Self-pay | Admitting: Family Medicine

## 2013-04-04 ENCOUNTER — Ambulatory Visit (INDEPENDENT_AMBULATORY_CARE_PROVIDER_SITE_OTHER): Payer: 59 | Admitting: Family Medicine

## 2013-04-04 VITALS — BP 118/80 | Temp 97.8°F | Wt 223.0 lb

## 2013-04-04 DIAGNOSIS — Z23 Encounter for immunization: Secondary | ICD-10-CM

## 2013-04-04 DIAGNOSIS — M545 Low back pain: Secondary | ICD-10-CM

## 2013-04-04 MED ORDER — NAPROXEN 500 MG PO TABS: 500.0000 mg | ORAL_TABLET | Freq: Two times a day (BID) | ORAL | Status: DC

## 2013-04-04 MED ORDER — CYCLOBENZAPRINE HCL 5 MG PO TABS: 5.0000 mg | ORAL_TABLET | Freq: Three times a day (TID) | ORAL | Status: DC | PRN

## 2013-04-04 NOTE — Progress Notes (Signed)
Chief Complaint  Patient presents with  . Back Pain    HPI:  Tracy Turner, a 57 yo M patient of Dr. Amador Cunas, is here for an acute visit for:  Back pain and to get a flu shot: -started: 36 years ago, has occ flare -described as: yesterday had flare start after bending over to vacuum, pain is in low back, sharp pain, sometimes pain radiates to L leg -better with/worse with: certain movements and ibuprofen make it better and usually heals after several weeks on its own /certain positions make it worse -denies: fevers, chills, weakness, numbness, malaise, bowel or bladder dysfunction -hx of: chronic back pain intermittently, last flare 2 years ago   ROS: See pertinent positives and negatives per HPI.  Past Medical History  Diagnosis Date  . Hyperlipidemia   . Hypothyroidism   . Tobacco abuse   . History of colonic polyps   . GERD (gastroesophageal reflux disease)     Past Surgical History  Procedure Laterality Date  . Colonoscopy  2008  . Ett  2007    Family History  Problem Relation Age of Onset  . Heart disease Father   . Healthy Sister   . Healthy Brother   . Colon cancer Neg Hx     History   Social History  . Marital Status: Married    Spouse Name: N/A    Number of Children: N/A  . Years of Education: N/A   Social History Main Topics  . Smoking status: Former Smoker -- 0.50 packs/day    Types: Cigarettes    Quit date: 08/30/2012  . Smokeless tobacco: Never Used     Comment: using Chantix...Marland Kitchen40 years on and off  . Alcohol Use: Yes     Comment: occ. maybe every month   . Drug Use: No  . Sexual Activity: None   Other Topics Concern  . None   Social History Narrative  . None    Current outpatient prescriptions:aspirin 81 MG tablet, Take 81 mg by mouth daily., Disp: , Rfl: ;  Glucosamine-Chondroit-Vit C-Mn (GLUCOSAMINE CHONDR 1500 COMPLX) CAPS, Take 2 capsules by mouth daily., Disp: , Rfl: ;  levothyroxine (SYNTHROID, LEVOTHROID) 137 MCG tablet,  TAKE ONE TABLET BY MOUTH EVERY DAY (NEEDS  TO  BE  SEEN), Disp: 90 tablet, Rfl: 6;  MOVIPREP 100 G SOLR, Take as directed., Disp: 1 kit, Rfl: 0 Multiple Vitamins-Minerals (MENS 50+ MULTI VITAMIN/MIN PO), Take 1 tablet by mouth daily., Disp: , Rfl: ;  pantoprazole (PROTONIX) 40 MG tablet, TAKE ONE TABLET BY MOUTH EVERY DAY (NEEDS  TO  BE  SEEN), Disp: 60 tablet, Rfl: 6;  simvastatin (ZOCOR) 40 MG tablet, TAKE ONE TABLET BY MOUTH IN THE EVENING, Disp: 90 tablet, Rfl: 6 varenicline (CHANTIX CONTINUING MONTH PAK) 1 MG tablet, Take 1 tablet (1 mg total) by mouth 2 (two) times daily., Disp: 60 tablet, Rfl: 2;  cyclobenzaprine (FLEXERIL) 5 MG tablet, Take 1 tablet (5 mg total) by mouth 3 (three) times daily as needed for muscle spasms., Disp: 30 tablet, Rfl: 1;  naproxen (NAPROSYN) 500 MG tablet, Take 1 tablet (500 mg total) by mouth 2 (two) times daily with a meal., Disp: 30 tablet, Rfl: 0  EXAM:  Filed Vitals:   04/04/13 1059  BP: 118/80  Temp: 97.8 F (36.6 C)    Body mass index is 30.24 kg/(m^2).  GENERAL: vitals reviewed and listed above, alert, oriented, appears well hydrated and in no acute distress  HEENT: atraumatic, conjunttiva clear, no obvious abnormalities  on inspection of external nose and ears  NECK: no obvious masses on inspection  MS: moves all extremities without noticeable abnormality  Normal Gait Normal inspection of back, no obvious scoliosis or leg length descrepancy No bony TTP Soft tissue TTP at: paraspinal muscles low back, SI joint L -/+ tests: neg trendelenburg,-facet loading, -SLRT, -CLRT, +FABER L, -FADIR Normal muscle strength, sensation to light touch and DTRs in LEs bilaterally  PSYCH: pleasant and cooperative, no obvious depression or anxiety  ASSESSMENT AND PLAN:  Discussed the following assessment and plan:  Need for prophylactic vaccination and inoculation against influenza - Plan: Flu Vaccine QUAD 36+ mos PF IM (Fluarix)  Low back pain - Plan:  naproxen (NAPROSYN) 500 MG tablet, cyclobenzaprine (FLEXERIL) 5 MG tablet  -likely muscle strain, discuss other possible etiologies - possible DDD - but normal neuro exam, or SI jt pathology on L -HEP, meds per orders - risks discussed, return precautions -advised to follow up in 3-4 weeks or sooner if needed -Patient advised to return or notify a doctor immediately if symptoms worsen or persist or new concerns arise.  Patient Instructions  -As we discussed, we have prescribed a new medication for you at this appointment. We discussed the common and serious potential adverse effects of this medication and you can review these and more with the pharmacist when you pick up your medication.  Please follow the instructions for use carefully and notify us immediately if you have any problems taking this medication.  -do exercises 4 times per week  -follow up with your doctor in 1 month or sooner if needed     Dazia Lippold, [facility] R.

## 2013-04-04 NOTE — Progress Notes (Signed)
Pre visit review using our clinic review tool, if applicable. No additional management support is needed unless otherwise documented below in the visit note. 

## 2013-04-04 NOTE — Patient Instructions (Signed)
-  As we discussed, we have prescribed a new medication for you at this appointment. We discussed the common and serious potential adverse effects of this medication and you can review these and more with the pharmacist when you pick up your medication.  Please follow the instructions for use carefully and notify us immediately if you have any problems taking this medication.  -do exercises 4 times per week  -follow up with your doctor in 1 month or sooner if needed

## 2013-05-09 ENCOUNTER — Ambulatory Visit: Payer: 59 | Admitting: Internal Medicine

## 2013-07-21 ENCOUNTER — Other Ambulatory Visit (INDEPENDENT_AMBULATORY_CARE_PROVIDER_SITE_OTHER): Payer: 59

## 2013-07-21 DIAGNOSIS — Z0000 Encounter for general adult medical examination without abnormal findings: Secondary | ICD-10-CM

## 2013-07-21 LAB — POCT URINALYSIS DIPSTICK
Blood, UA: NEGATIVE
Glucose, UA: NEGATIVE
Leukocytes, UA: NEGATIVE
Nitrite, UA: NEGATIVE
PH UA: 5.5
Urobilinogen, UA: 0.2

## 2013-07-21 LAB — BASIC METABOLIC PANEL
BUN: 18 mg/dL (ref 6–23)
CHLORIDE: 106 meq/L (ref 96–112)
CO2: 26 meq/L (ref 19–32)
CREATININE: 1 mg/dL (ref 0.4–1.5)
Calcium: 9.2 mg/dL (ref 8.4–10.5)
GFR: 78.19 mL/min (ref >60.00)
GLUCOSE: 155 mg/dL — AB (ref 70–99)
Potassium: 4.4 mEq/L (ref 3.5–5.1)
SODIUM: 139 meq/L (ref 135–145)

## 2013-07-21 LAB — CBC WITH DIFFERENTIAL/PLATELET
BASOS ABS: 0 10*3/uL (ref 0.0–0.1)
BASOS PCT: 0.3 % (ref 0.0–3.0)
EOS ABS: 0.2 10*3/uL (ref 0.0–0.7)
Eosinophils Relative: 3.5 % (ref 0.0–5.0)
HCT: 44.9 % (ref 39.0–52.0)
HEMOGLOBIN: 14.8 g/dL (ref 13.0–17.0)
Lymphocytes Relative: 20.5 % (ref 12.0–46.0)
Lymphs Abs: 1.2 10*3/uL (ref 0.7–4.0)
MCHC: 33 g/dL (ref 30.0–36.0)
MCV: 93.6 fl (ref 78.0–100.0)
MONO ABS: 0.5 10*3/uL (ref 0.1–1.0)
Monocytes Relative: 7.7 % (ref 3.0–12.0)
NEUTROS ABS: 4 10*3/uL (ref 1.4–7.7)
Neutrophils Relative %: 68 % (ref 43.0–77.0)
Platelets: 235 10*3/uL (ref 150.0–400.0)
RBC: 4.8 Mil/uL (ref 4.22–5.81)
RDW: 12.8 % (ref 11.5–14.6)
WBC: 5.8 10*3/uL (ref 4.5–10.5)

## 2013-07-21 LAB — LIPID PANEL
CHOL/HDL RATIO: 5
Cholesterol: 170 mg/dL (ref 0–200)
HDL: 37.4 mg/dL — ABNORMAL LOW (ref >39.00)
LDL Cholesterol: 91 mg/dL (ref 0–99)
Triglycerides: 209 mg/dL — ABNORMAL HIGH (ref 0.0–149.0)
VLDL: 41.8 mg/dL — ABNORMAL HIGH (ref 0.0–40.0)

## 2013-07-21 LAB — HEPATIC FUNCTION PANEL
ALT: 45 U/L (ref 0–53)
AST: 36 U/L (ref 0–37)
Albumin: 4.5 g/dL (ref 3.5–5.2)
Alkaline Phosphatase: 40 U/L (ref 39–117)
BILIRUBIN DIRECT: 0.1 mg/dL (ref 0.0–0.3)
TOTAL PROTEIN: 6.9 g/dL (ref 6.0–8.3)
Total Bilirubin: 0.7 mg/dL (ref 0.3–1.2)

## 2013-07-21 LAB — TSH: TSH: 1.93 u[IU]/mL (ref 0.35–5.50)

## 2013-07-21 LAB — PSA: PSA: 0.57 ng/mL (ref 0.10–4.00)

## 2013-07-28 ENCOUNTER — Other Ambulatory Visit: Payer: Self-pay | Admitting: Internal Medicine

## 2013-07-28 ENCOUNTER — Encounter: Payer: 59 | Admitting: Internal Medicine

## 2013-07-29 ENCOUNTER — Ambulatory Visit (INDEPENDENT_AMBULATORY_CARE_PROVIDER_SITE_OTHER): Payer: 59 | Admitting: Internal Medicine

## 2013-07-29 ENCOUNTER — Encounter: Payer: Self-pay | Admitting: Internal Medicine

## 2013-07-29 VITALS — BP 132/90 | HR 78 | Temp 98.4°F | Resp 18 | Ht 71.5 in | Wt 223.0 lb

## 2013-07-29 DIAGNOSIS — R7302 Impaired glucose tolerance (oral): Secondary | ICD-10-CM

## 2013-07-29 DIAGNOSIS — Z23 Encounter for immunization: Secondary | ICD-10-CM

## 2013-07-29 DIAGNOSIS — Z0000 Encounter for general adult medical examination without abnormal findings: Secondary | ICD-10-CM

## 2013-07-29 DIAGNOSIS — N529 Male erectile dysfunction, unspecified: Secondary | ICD-10-CM

## 2013-07-29 DIAGNOSIS — E785 Hyperlipidemia, unspecified: Secondary | ICD-10-CM

## 2013-07-29 DIAGNOSIS — E039 Hypothyroidism, unspecified: Secondary | ICD-10-CM

## 2013-07-29 DIAGNOSIS — R7309 Other abnormal glucose: Secondary | ICD-10-CM

## 2013-07-29 LAB — HEMOGLOBIN A1C: Hgb A1c MFr Bld: 8.3 % — ABNORMAL HIGH (ref 4.6–6.5)

## 2013-07-29 LAB — TESTOSTERONE: Testosterone: 226.06 ng/dL — ABNORMAL LOW (ref 350.00–890.00)

## 2013-07-29 MED ORDER — PANTOPRAZOLE SODIUM 40 MG PO TBEC: DELAYED_RELEASE_TABLET | ORAL | Status: DC

## 2013-07-29 MED ORDER — SIMVASTATIN 40 MG PO TABS: ORAL_TABLET | ORAL | Status: DC

## 2013-07-29 MED ORDER — LEVOTHYROXINE SODIUM 137 MCG PO TABS: ORAL_TABLET | ORAL | Status: DC

## 2013-07-29 NOTE — Progress Notes (Signed)
Subjective:    Patient ID: Tracy Turner, male    DOB: Aug 28, 1955, 58 y.o.   MRN: [id number]  HPI  58 year old patient who is seen today for a wellness exam.  He has a history of dyslipidemia and hypothyroidism.  Additionally, he has a history of impaired glucose tolerance. He has a prior history of colonic polyps.  However, results from his prior colonoscopy and upper endoscopy have been obtained and reviewed.  The patient had biopsies performed from his epigastric region, but no history of colonic polyps.  He is on now a 10 year interval for surveillance His only other complaint is decreased libido and ED issues that have worsened over the past few years.  Wt Readings from Last 3 Encounters:  07/29/13 223 lb (101.152 kg)  04/04/13 223 lb (101.152 kg)  08/30/12 223 lb 6.4 oz (101.334 kg)   Past Medical History  Diagnosis Date  . Hyperlipidemia   . Hypothyroidism   . Tobacco abuse   . History of colonic polyps   . GERD (gastroesophageal reflux disease)     History   Social History  . Marital Status: Married    Spouse Name: N/A    Number of Children: N/A  . Years of Education: N/A   Occupational History  . Not on file.   Social History Main Topics  . Smoking status: Former Smoker -- 0.50 packs/day    Types: Cigarettes    Quit date: 08/30/2012  . Smokeless tobacco: Never Used     Comment: using Chantix...Marland Kitchen40 years on and off  . Alcohol Use: Yes     Comment: occ. maybe every month   . Drug Use: No  . Sexual Activity: Not on file   Other Topics Concern  . Not on file   Social History Narrative  . No narrative on file    Past Surgical History  Procedure Laterality Date  . Colonoscopy  2008  . Ett  2007    Family History  Problem Relation Age of Onset  . Heart disease Father   . Healthy Sister   . Healthy Brother   . Colon cancer Neg Hx     No Known Allergies  Current Outpatient Prescriptions on File Prior to Visit  Medication Sig Dispense Refill  .  aspirin 81 MG tablet Take 81 mg by mouth daily.      . Glucosamine-Chondroit-Vit C-Mn (GLUCOSAMINE CHONDR 1500 COMPLX) CAPS Take 2 capsules by mouth daily.      . Multiple Vitamins-Minerals (MENS 50+ MULTI VITAMIN/MIN PO) Take 1 tablet by mouth daily.       No current facility-administered medications on file prior to visit.    BP 132/90  Pulse 78  Temp(Src) 98.4 F (36.9 C) (Oral)  Resp 18  Ht 5' 11.5" (1.816 m)  Wt 223 lb (101.152 kg)  BMI 30.67 kg/m2  SpO2 95%    Review of Systems  Constitutional: Positive for fatigue. Negative for fever, chills and appetite change.  HENT: Negative for congestion, dental problem, ear pain, hearing loss, sore throat, tinnitus, trouble swallowing and voice change.   Eyes: Negative for pain, discharge and visual disturbance.  Respiratory: Negative for cough, chest tightness, wheezing and stridor.   Cardiovascular: Negative for chest pain, palpitations and leg swelling.  Gastrointestinal: Negative for nausea, vomiting, abdominal pain, diarrhea, constipation, blood in stool and abdominal distention.  Genitourinary: Negative for urgency, hematuria, flank pain, discharge, difficulty urinating and genital sores.  Musculoskeletal: Negative for arthralgias, back pain, gait problem, joint  swelling, myalgias and neck stiffness.  Skin: Negative for rash.  Neurological: Negative for dizziness, syncope, speech difficulty, weakness, numbness and headaches.  Hematological: Negative for adenopathy. Does not bruise/bleed easily.  Psychiatric/Behavioral: Negative for behavioral problems and dysphoric mood. The patient is not nervous/anxious.        Objective:   Physical Exam  Constitutional: He appears well-developed and well-nourished.  HENT:  Head: Normocephalic and atraumatic.  Right Ear: External ear normal.  Left Ear: External ear normal.  Nose: Nose normal.  Mouth/Throat: Oropharynx is clear and moist.  Eyes: Conjunctivae and EOM are normal. Pupils  are equal, round, and reactive to light. No scleral icterus.  Neck: Normal range of motion. Neck supple. No JVD present. No thyromegaly present.  Cardiovascular: Regular rhythm, normal heart sounds and intact distal pulses.  Exam reveals no gallop and no friction rub.   No murmur heard. Pulmonary/Chest: Effort normal and breath sounds normal. He exhibits no tenderness.  Abdominal: Soft. Bowel sounds are normal. He exhibits no distension and no mass. There is no tenderness.  Genitourinary: Prostate normal and penis normal. Guaiac negative stool.  Musculoskeletal: Normal range of motion. He exhibits no edema and no tenderness.  Lymphadenopathy:    He has no cervical adenopathy.  Neurological: He is alert. He has normal reflexes. No cranial nerve deficit. Coordination normal.  Skin: Skin is warm and dry. No rash noted.  Psychiatric: He has a normal mood and affect. His behavior is normal.          Assessment & Plan:   Preventive health exam Impaired glucose tolerance  We'll check a hemoglobin A1c ED/impaired libido.  Will check a testosterone level  Exercise weight loss encouraged Recheck one year

## 2013-07-29 NOTE — Progress Notes (Signed)
Pre-visit discussion using our clinic review tool. No additional management support is needed unless otherwise documented below in the visit note.  

## 2013-07-29 NOTE — Patient Instructions (Signed)
It is important that you exercise regularly, at least 20 minutes 3 to 4 times per week.  If you develop chest pain or shortness of breath seek  medical attention.  You need to lose weight.  Consider a lower calorie diet and regular exercise.  Return in one year for follow-up   

## 2013-08-01 ENCOUNTER — Other Ambulatory Visit: Payer: Self-pay | Admitting: *Deleted

## 2013-08-01 ENCOUNTER — Telehealth: Payer: Self-pay | Admitting: Internal Medicine

## 2013-08-01 MED ORDER — METFORMIN HCL 500 MG PO TABS
500.0000 mg | ORAL_TABLET | Freq: Two times a day (BID) | ORAL | Status: DC
Start: 2013-08-01 — End: 2014-02-15

## 2013-08-01 NOTE — Telephone Encounter (Signed)
Spoke to pt see result note 

## 2013-08-01 NOTE — Telephone Encounter (Signed)
Pt stated his a1c was elevated. Pt would like to know whats the next step?

## 2013-08-10 ENCOUNTER — Telehealth: Payer: Self-pay | Admitting: Internal Medicine

## 2013-08-10 NOTE — Telephone Encounter (Signed)
FYI

## 2013-08-10 NOTE — Telephone Encounter (Signed)
Patient Information:  Caller Name: Finley  Phone: [phone]  Patient: Tracy Turner, Tracy Turner  Gender: Male  DOB: 1955-11-23  Age: 58 Years  PCP: Bluford Kaufmann (Family Practice > 21yrs old)  Office Follow Up:  Does the office need to follow up with this patient?: No  Instructions For The Office: N/A   Symptoms  Reason For Call & Symptoms: Was diagnosed with diabetes and began taking Metformin. Wants to know if is safe to use Splenda. Advised can try and continue monitoring blood sugar.  Reviewed Health History In EMR: Yes  Reviewed Medications In EMR: Yes  Reviewed Allergies In EMR: N/A  Reviewed Surgeries / Procedures: N/A  Date of Onset of Symptoms: 08/10/2013  Guideline(s) Used:  No Protocol Available - Information Only  Disposition Per Guideline:   Home Care  Reason For Disposition Reached:   Information only question and nurse able to answer  Advice Given:  N/A  Patient Will Follow Care Advice:  YES

## 2013-08-22 ENCOUNTER — Ambulatory Visit (INDEPENDENT_AMBULATORY_CARE_PROVIDER_SITE_OTHER): Payer: 59 | Admitting: Internal Medicine

## 2013-08-22 ENCOUNTER — Encounter: Payer: Self-pay | Admitting: Internal Medicine

## 2013-08-22 VITALS — BP 120/82 | HR 86 | Temp 98.8°F | Resp 20 | Ht 71.5 in | Wt 218.0 lb

## 2013-08-22 DIAGNOSIS — E291 Testicular hypofunction: Secondary | ICD-10-CM

## 2013-08-22 DIAGNOSIS — E119 Type 2 diabetes mellitus without complications: Secondary | ICD-10-CM | POA: Insufficient documentation

## 2013-08-22 DIAGNOSIS — E349 Endocrine disorder, unspecified: Secondary | ICD-10-CM

## 2013-08-22 MED ORDER — AVANAFIL 100 MG PO TABS: 1.0000 | ORAL_TABLET | ORAL | Status: DC | PRN

## 2013-08-22 NOTE — Progress Notes (Signed)
Pre-visit discussion using our clinic review tool. No additional management support is needed unless otherwise documented below in the visit note.  

## 2013-08-22 NOTE — Patient Instructions (Signed)
Please check your hemoglobin A1c every 3 months    It is important that you exercise regularly, at least 20 minutes 3 to 4 times per week.  If you develop chest pain or shortness of breath seek  medical attention.   

## 2013-08-22 NOTE — Progress Notes (Signed)
Subjective:    Patient ID: Tracy Turner, male    DOB: [birth year], 58 y.o.   MRN: [id number]  HPI   58 year old patient who has a history of impaired glucose tolerance.  He was seen last month for an annual exam.  Hemoglobin A1c was greater than 8 and he now is on metformin therapy.  He is eating much better and has enjoyed some  Modest weight loss.  He feels well today. He was also noted to have a low testosterone level.  He has responded well to medication and wishes to continue present therapy.  Past Medical History  Diagnosis Date  . Hyperlipidemia   . Hypothyroidism   . Tobacco abuse   . History of colonic polyps   . GERD (gastroesophageal reflux disease)     History   Social History  . Marital Status: Married    Spouse Name: N/A    Number of Children: N/A  . Years of Education: N/A   Occupational History  . Not on file.   Social History Main Topics  . Smoking status: Former Smoker -- 0.50 packs/day    Types: Cigarettes    Quit date: 08/30/2012  . Smokeless tobacco: Never Used     Comment: using Chantix...Marland Kitchen40 years on and off  . Alcohol Use: Yes     Comment: occ. maybe every month   . Drug Use: No  . Sexual Activity: Not on file   Other Topics Concern  . Not on file   Social History Narrative  . No narrative on file    Past Surgical History  Procedure Laterality Date  . Colonoscopy  2008  . Ett  2007    Family History  Problem Relation Age of Onset  . Heart disease Father   . Healthy Sister   . Healthy Brother   . Colon cancer Neg Hx     No Known Allergies  Current Outpatient Prescriptions on File Prior to Visit  Medication Sig Dispense Refill  . aspirin 81 MG tablet Take 81 mg by mouth daily.      . Glucosamine-Chondroit-Vit C-Mn (GLUCOSAMINE CHONDR 1500 COMPLX) CAPS Take 2 capsules by mouth daily.      Marland Kitchen levothyroxine (SYNTHROID, LEVOTHROID) 137 MCG tablet TAKE ONE TABLET BY MOUTH EVERY DAY  90 tablet  3  . metFORMIN (GLUCOPHAGE) 500 MG  tablet Take 1 tablet (500 mg total) by mouth 2 (two) times daily with a meal.  180 tablet  1  . Multiple Vitamins-Minerals (MENS 50+ MULTI VITAMIN/MIN PO) Take 1 tablet by mouth daily.      . pantoprazole (PROTONIX) 40 MG tablet TAKE ONE TABLET BY MOUTH ONCE DAILY  90 tablet  3  . simvastatin (ZOCOR) 40 MG tablet TAKE ONE TABLET BY MOUTH IN THE EVENING  90 tablet  3   No current facility-administered medications on file prior to visit.    BP 120/82  Pulse 86  Temp(Src) 98.8 F (37.1 C) (Oral)  Resp 20  Ht 5' 11.5" (1.816 m)  Wt 218 lb (98.884 kg)  BMI 29.98 kg/m2  SpO2 96%       Review of Systems  Constitutional: Negative for fever, chills, appetite change and fatigue.  HENT: Negative for congestion, dental problem, ear pain, hearing loss, sore throat, tinnitus, trouble swallowing and voice change.   Eyes: Negative for pain, discharge and visual disturbance.  Respiratory: Negative for cough, chest tightness, wheezing and stridor.   Cardiovascular: Negative for chest pain, palpitations and leg swelling.  Gastrointestinal: Negative for nausea, vomiting, abdominal pain, diarrhea, constipation, blood in stool and abdominal distention.  Genitourinary: Negative for urgency, hematuria, flank pain, discharge, difficulty urinating and genital sores.  Musculoskeletal: Negative for arthralgias, back pain, gait problem, joint swelling, myalgias and neck stiffness.  Skin: Negative for rash.  Neurological: Negative for dizziness, syncope, speech difficulty, weakness, numbness and headaches.  Hematological: Negative for adenopathy. Does not bruise/bleed easily.  Psychiatric/Behavioral: Negative for behavioral problems and dysphoric mood. The patient is not nervous/anxious.        Objective:   Physical Exam  Constitutional: He appears well-developed and well-nourished. No distress.  Blood pressure 120/82  Wt Readings from Last 3 Encounters: 08/22/13 : 218 lb (98.884 kg) 07/29/13 : 223  lb (101.152 kg) 04/04/13 : 223 lb (101.152 kg)          Assessment & Plan:  Diabetes mellitus.  Much improved on lifestyle and metformin therapy.  Will continue.  Will recheck in 3 months and check a hemoglobin A1c at that time ED/testosterone deficiency.  We'll continue present regimen

## 2013-09-02 ENCOUNTER — Telehealth: Payer: Self-pay

## 2013-09-02 NOTE — Telephone Encounter (Signed)
Relevant patient education assigned to patient using Emmi. ° °

## 2013-09-26 ENCOUNTER — Other Ambulatory Visit: Payer: Self-pay | Admitting: Internal Medicine

## 2013-11-21 ENCOUNTER — Ambulatory Visit (INDEPENDENT_AMBULATORY_CARE_PROVIDER_SITE_OTHER): Payer: 59 | Admitting: Internal Medicine

## 2013-11-21 ENCOUNTER — Encounter: Payer: Self-pay | Admitting: Internal Medicine

## 2013-11-21 VITALS — BP 110/76 | HR 68 | Temp 98.1°F | Resp 20 | Ht 71.5 in | Wt 196.0 lb

## 2013-11-21 DIAGNOSIS — E349 Endocrine disorder, unspecified: Secondary | ICD-10-CM

## 2013-11-21 DIAGNOSIS — E785 Hyperlipidemia, unspecified: Secondary | ICD-10-CM

## 2013-11-21 DIAGNOSIS — E291 Testicular hypofunction: Secondary | ICD-10-CM

## 2013-11-21 DIAGNOSIS — E039 Hypothyroidism, unspecified: Secondary | ICD-10-CM

## 2013-11-21 DIAGNOSIS — E119 Type 2 diabetes mellitus without complications: Secondary | ICD-10-CM

## 2013-11-21 LAB — HEMOGLOBIN A1C: HEMOGLOBIN A1C: 6.5 % (ref 4.6–6.5)

## 2013-11-21 NOTE — Patient Instructions (Signed)
Please check your hemoglobin A1c every 3 months  Limit your sodium (Salt) intake  Plantar Fasciitis Plantar fasciitis is a common condition that causes foot pain. It is soreness (inflammation) of the band of tough fibrous tissue on the bottom of the foot that runs from the heel bone (calcaneus) to the ball of the foot. The cause of this soreness may be from excessive standing, poor fitting shoes, running on hard surfaces, being overweight, having an abnormal walk, or overuse (this is common in runners) of the painful foot or feet. It is also common in aerobic exercise dancers and ballet dancers. SYMPTOMS  Most people with plantar fasciitis complain of:  Severe pain in the morning on the bottom of their foot especially when taking the first steps out of bed. This pain recedes after a few minutes of walking.  Severe pain is experienced also during walking following a long period of inactivity.  Pain is worse when walking barefoot or up stairs DIAGNOSIS   Your caregiver will diagnose this condition by examining and feeling your foot.  Special tests such as X-rays of your foot, are usually not needed. PREVENTION   Consult a sports medicine professional before beginning a new exercise program.  Walking programs offer a good workout. With walking there is a lower chance of overuse injuries common to runners. There is less impact and less jarring of the joints.  Begin all new exercise programs slowly. If problems or pain develop, decrease the amount of time or distance until you are at a comfortable level.  Wear good shoes and replace them regularly.  Stretch your foot and the heel cords at the back of the ankle (Achilles tendon) both before and after exercise.  Run or exercise on even surfaces that are not hard. For example, asphalt is better than pavement.  Do not run barefoot on hard surfaces.  If using a treadmill, vary the incline.  Do not continue to workout if you have foot or  joint problems. Seek professional help if they do not improve. HOME CARE INSTRUCTIONS   Avoid activities that cause you pain until you recover.  Use ice or cold packs on the problem or painful areas after working out.  Only take over-the-counter or prescription medicines for pain, discomfort, or fever as directed by your caregiver.  Soft shoe inserts or athletic shoes with air or gel sole cushions may be helpful.  If problems continue or become more severe, consult a sports medicine caregiver or your own health care provider. Cortisone is a potent anti-inflammatory medication that may be injected into the painful area. You can discuss this treatment with your caregiver. MAKE SURE YOU:   Understand these instructions.  Will watch your condition.  Will get help right away if you are not doing well or get worse. Document Released: 01/14/2001 Document Revised: 07/14/2011 Document Reviewed: 03/15/2008 [facility] Patient Information 2015 [place], [place]. This information is not intended to replace advice given to you by your health care provider. Make sure you discuss any questions you have with your health care provider.

## 2013-11-21 NOTE — Progress Notes (Signed)
Subjective:    Patient ID: Tracy Turner, male    DOB: 21-Apr-1956, 58 y.o.   MRN: [id number]  HPI  58 year old patient who is in today for followup of type 2 diabetes.  This was diagnosed earlier in the year.  Since his initial diagnosis.  He has lost 25 pounds in weight.  He remains on metformin therapy alone. His only complaint is a 6 or 7 week history of left heel pain. He has history of mild dyslipidemia and hypothyroidism. Past Medical History  Diagnosis Date  . Hyperlipidemia   . Hypothyroidism   . Tobacco abuse   . History of colonic polyps   . GERD (gastroesophageal reflux disease)     History   Social History  . Marital Status: Married    Spouse Name: N/A    Number of Children: N/A  . Years of Education: N/A   Occupational History  . Not on file.   Social History Main Topics  . Smoking status: Former Smoker -- 0.50 packs/day    Types: Cigarettes    Quit date: 08/30/2012  . Smokeless tobacco: Never Used     Comment: using Chantix...Marland Kitchen40 years on and off  . Alcohol Use: Yes     Comment: occ. maybe every month   . Drug Use: No  . Sexual Activity: Not on file   Other Topics Concern  . Not on file   Social History Narrative  . No narrative on file    Past Surgical History  Procedure Laterality Date  . Colonoscopy  2008  . Ett  2007    Family History  Problem Relation Age of Onset  . Heart disease Father   . Healthy Sister   . Healthy Brother   . Colon cancer Neg Hx     No Known Allergies  Current Outpatient Prescriptions on File Prior to Visit  Medication Sig Dispense Refill  . aspirin 81 MG tablet Take 81 mg by mouth daily.      . Avanafil 100 MG TABS Take 1 tablet by mouth as needed.  12 tablet  6  . Glucosamine-Chondroit-Vit C-Mn (GLUCOSAMINE CHONDR 1500 COMPLX) CAPS Take 2 capsules by mouth daily.      Marland Kitchen levothyroxine (SYNTHROID, LEVOTHROID) 137 MCG tablet TAKE ONE TABLET BY MOUTH EVERY DAY  90 tablet  3  . metFORMIN (GLUCOPHAGE) 500 MG  tablet Take 1 tablet (500 mg total) by mouth 2 (two) times daily with a meal.  180 tablet  1  . Multiple Vitamins-Minerals (MENS 50+ MULTI VITAMIN/MIN PO) Take 1 tablet by mouth daily.      . pantoprazole (PROTONIX) 40 MG tablet TAKE ONE TABLET BY MOUTH ONCE DAILY  90 tablet  3  . simvastatin (ZOCOR) 40 MG tablet TAKE ONE TABLET BY MOUTH IN THE EVENING  90 tablet  3   No current facility-administered medications on file prior to visit.    BP 110/76  Pulse 68  Temp(Src) 98.1 F (36.7 C) (Oral)  Resp 20  Ht 5' 11.5" (1.816 m)  Wt 196 lb (88.905 kg)  BMI 26.96 kg/m2  SpO2 99%      Review of Systems  Constitutional: Negative for fever, chills, appetite change and fatigue.  HENT: Negative for congestion, dental problem, ear pain, hearing loss, sore throat, tinnitus, trouble swallowing and voice change.   Eyes: Negative for pain, discharge and visual disturbance.  Respiratory: Negative for cough, chest tightness, wheezing and stridor.   Cardiovascular: Negative for chest pain, palpitations and leg swelling.  Gastrointestinal: Negative for nausea, vomiting, abdominal pain, diarrhea, constipation, blood in stool and abdominal distention.  Genitourinary: Negative for urgency, hematuria, flank pain, discharge, difficulty urinating and genital sores.  Musculoskeletal: Positive for arthralgias (Left heel pain). Negative for back pain, gait problem, joint swelling, myalgias and neck stiffness.  Skin: Negative for rash.  Neurological: Negative for dizziness, syncope, speech difficulty, weakness, numbness and headaches.  Hematological: Negative for adenopathy. Does not bruise/bleed easily.  Psychiatric/Behavioral: Negative for behavioral problems and dysphoric mood. The patient is not nervous/anxious.        Objective:   Physical Exam  Constitutional: He is oriented to person, place, and time. He appears well-developed.  HENT:  Head: Normocephalic.  Right Ear: External ear normal.  Left  Ear: External ear normal.  Eyes: Conjunctivae and EOM are normal.  Neck: Normal range of motion.  Cardiovascular: Normal rate and normal heart sounds.   Pulmonary/Chest: Breath sounds normal.  Abdominal: Bowel sounds are normal.  Musculoskeletal: Normal range of motion. He exhibits no edema and no tenderness.  Tenderness over the medial heel area  Neurological: He is alert and oriented to person, place, and time.  Psychiatric: He has a normal mood and affect. His behavior is normal.          Assessment & Plan:   Diabetes mellitus.  We'll check a hemoglobin A1c Hypothyroidism Dyslipidemia Left heel pain.  Probable plantar fasciitis.  Foot care discussed.  Information dispensed

## 2013-11-21 NOTE — Progress Notes (Signed)
   Subjective:    Patient ID: Tracy Turner, male    DOB: 07-Oct-1955, 58 y.o.   MRN: [id number]  HPI   Wt Readings from Last 3 Encounters:  11/21/13 196 lb (88.905 kg)  08/22/13 218 lb (98.884 kg)  07/29/13 223 lb (101.152 kg)   Review of Systems     Objective:   Physical Exam        Assessment & Plan:

## 2013-11-21 NOTE — Progress Notes (Signed)
Pre visit review using our clinic review tool, if applicable. No additional management support is needed unless otherwise documented below in the visit note. 

## 2013-11-23 ENCOUNTER — Other Ambulatory Visit: Payer: Self-pay | Admitting: Internal Medicine

## 2013-12-30 ENCOUNTER — Telehealth: Payer: Self-pay | Admitting: Internal Medicine

## 2013-12-30 NOTE — Telephone Encounter (Signed)
ok 

## 2013-12-30 NOTE — Telephone Encounter (Signed)
Pt would like to know if dr. Raliegh Ip will accept his father-in-law as a new pt, pt states he just moved here from another [place] and needs a primary doctor. Pt would like for him to see dr. Raliegh Ip as well.

## 2014-01-19 ENCOUNTER — Ambulatory Visit (INDEPENDENT_AMBULATORY_CARE_PROVIDER_SITE_OTHER): Payer: 59 | Admitting: Internal Medicine

## 2014-01-19 ENCOUNTER — Encounter: Payer: Self-pay | Admitting: Internal Medicine

## 2014-01-19 VITALS — BP 120/72 | HR 63 | Temp 98.5°F | Resp 20 | Ht 71.5 in | Wt 199.0 lb

## 2014-01-19 DIAGNOSIS — K4090 Unilateral inguinal hernia, without obstruction or gangrene, not specified as recurrent: Secondary | ICD-10-CM

## 2014-01-19 NOTE — Progress Notes (Signed)
Pre visit review using our clinic review tool, if applicable. No additional management support is needed unless otherwise documented below in the visit note. 

## 2014-01-19 NOTE — Patient Instructions (Signed)

## 2014-01-19 NOTE — Progress Notes (Signed)
Subjective:    Patient ID: Tracy Turner, male    DOB: 1955-07-31, 58 y.o.   MRN: [id number]  HPI 58 year old patient who is in today concerned about a mass in the right groin area.  He has noted a small bulge in this area for some time, but this has worsened over the past couple weeks with the vigorous activities.  He notes the bulge increases with standing and coughing and at times is mildly uncomfortable  Past Medical History  Diagnosis Date  . Hyperlipidemia   . Hypothyroidism   . Tobacco abuse   . History of colonic polyps   . GERD (gastroesophageal reflux disease)     History   Social History  . Marital Status: Married    Spouse Name: N/A    Number of Children: N/A  . Years of Education: N/A   Occupational History  . Not on file.   Social History Main Topics  . Smoking status: Former Smoker -- 0.50 packs/day    Types: Cigarettes    Quit date: 08/30/2012  . Smokeless tobacco: Never Used     Comment: using Chantix...Marland Kitchen40 years on and off  . Alcohol Use: Yes     Comment: occ. maybe every month   . Drug Use: No  . Sexual Activity: Not on file   Other Topics Concern  . Not on file   Social History Narrative  . No narrative on file    Past Surgical History  Procedure Laterality Date  . Colonoscopy  2008  . Ett  2007    Family History  Problem Relation Age of Onset  . Heart disease Father   . Healthy Sister   . Healthy Brother   . Colon cancer Neg Hx     No Known Allergies  Current Outpatient Prescriptions on File Prior to Visit  Medication Sig Dispense Refill  . aspirin 81 MG tablet Take 81 mg by mouth daily.      . Avanafil 100 MG TABS Take 1 tablet by mouth as needed.  12 tablet  6  . Glucosamine-Chondroit-Vit C-Mn (GLUCOSAMINE CHONDR 1500 COMPLX) CAPS Take 2 capsules by mouth daily.      Marland Kitchen levothyroxine (SYNTHROID, LEVOTHROID) 137 MCG tablet TAKE ONE TABLET BY MOUTH EVERY DAY  90 tablet  3  . metFORMIN (GLUCOPHAGE) 500 MG tablet Take 1 tablet  (500 mg total) by mouth 2 (two) times daily with a meal.  180 tablet  1  . Multiple Vitamins-Minerals (MENS 50+ MULTI VITAMIN/MIN PO) Take 1 tablet by mouth daily.      . pantoprazole (PROTONIX) 40 MG tablet Take 1 tablet (40 mg total) by mouth daily.  60 tablet  5  . simvastatin (ZOCOR) 40 MG tablet TAKE ONE TABLET BY MOUTH IN THE EVENING  90 tablet  3   No current facility-administered medications on file prior to visit.    BP 120/72  Pulse 63  Temp(Src) 98.5 F (36.9 C) (Oral)  Resp 20  Ht 5' 11.5" (1.816 m)  Wt 199 lb (90.266 kg)  BMI 27.37 kg/m2  SpO2 98%   Review of Systems  Constitutional: Negative for fever, chills, appetite change and fatigue.  HENT: Negative for congestion, dental problem, ear pain, hearing loss, sore throat, tinnitus, trouble swallowing and voice change.   Eyes: Negative for pain, discharge and visual disturbance.  Respiratory: Negative for cough, chest tightness, wheezing and stridor.   Cardiovascular: Negative for chest pain, palpitations and leg swelling.  Gastrointestinal: Negative for nausea, vomiting,  abdominal pain, diarrhea, constipation, blood in stool and abdominal distention.  Genitourinary: Negative for urgency, hematuria, flank pain, discharge, difficulty urinating and genital sores.  Musculoskeletal: Negative for arthralgias, back pain, gait problem, joint swelling, myalgias and neck stiffness.  Skin: Negative for rash.  Neurological: Negative for dizziness, syncope, speech difficulty, weakness, numbness and headaches.  Hematological: Negative for adenopathy. Does not bruise/bleed easily.  Psychiatric/Behavioral: Negative for behavioral problems and dysphoric mood. The patient is not nervous/anxious.        Objective:   Physical Exam  Constitutional: He appears well-developed and well-nourished. No distress.  Abdominal:  Small nontender reducible right inguinal hernia          Assessment & Plan:   Small right inguinal hernia.   Discussed at length and information provided.  He will consider surgical referral.  If mass enlarges or becomes more symptomatic

## 2014-02-15 ENCOUNTER — Other Ambulatory Visit: Payer: Self-pay | Admitting: Internal Medicine

## 2014-04-12 ENCOUNTER — Telehealth: Payer: Self-pay | Admitting: Internal Medicine

## 2014-04-12 DIAGNOSIS — K4090 Unilateral inguinal hernia, without obstruction or gangrene, not specified as recurrent: Secondary | ICD-10-CM

## 2014-04-12 NOTE — Telephone Encounter (Signed)
Pt would like a referral to [facility] for right inguinal hernia. Pt was seen on 01-19-14. Pt has [facility] ins. Pt is aware md out of office until 04/24/14

## 2014-04-13 NOTE — Telephone Encounter (Signed)
Spoke to pt, told him did order for General Surgeon for Right Inguinal Hernia and someone will be getting in touch with you regarding an appointment. Pt verbalized understanding.

## 2014-04-17 ENCOUNTER — Encounter: Payer: Self-pay | Admitting: Internal Medicine

## 2014-04-18 NOTE — Telephone Encounter (Signed)
Spoke to pt, told him appointment is scheduled for Dec 23rd at 10:15 with Dr. Johney [place] at [facility] according to the chart they were suppose to contact you. Gave pt address and phone number told pt to call and verify appointment and if any problems please call back. Pt verbalized understanding.

## 2014-04-26 ENCOUNTER — Other Ambulatory Visit (INDEPENDENT_AMBULATORY_CARE_PROVIDER_SITE_OTHER): Payer: Self-pay | Admitting: Surgery

## 2014-04-26 NOTE — H&P (Signed)
Tracy Turner 04/26/2014 10:08 AM Location: [facility] Patient #: [id number] DOB: May 12, 1955 Married / Language: Tracy Turner / Race: White Male History of Present Illness [name] MD; 04/26/2014 11:08 AM) Patient words: eval hernia.  The patient is a 58 year old male who presents with an inguinal hernia. Patient sent to me for surgical consultation by his primary care physician, Dr. Bluford Kaufmann, for concern of inguinal hernia. Pleasant and active male. 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 He noted some groin pulling and burning about a year ago. No definite lump. However obvious hernia noted a few months ago. It is becoming more sore and larger of these past few months. Discussed with his primary care physician. Surgical consultation recommended. He has no history of prior surgeries. Usually has bowel movement every day. No longer smokes. Very active. Can walk several miles without difficulty. No history of cardiopulmonary events. Urinates once a nice but no major prostatism issues Other Problems ([name], LPN; 68/04/7516 00:17 AM) Bladder Problems Gastroesophageal Reflux Disease Hypercholesterolemia Inguinal Hernia  Past Surgical History ([name], LPN; 49/44/9675 91:63 AM) No pertinent past surgical history  Diagnostic Studies History ([name], LPN; 84/66/5993 57:01 AM) Colonoscopy 5-10 years ago  Allergies ([name], LPN; 77/93/9030 09:23 AM) No Known Drug Allergies 04/26/2014  Medication History ([name], LPN; 30/11/6224 33:35 AM) Levothyroxine Sodium (137MCG Tablet, Oral) Active. Pantoprazole Sodium (40MG  Tablet DR, Oral) Active. MetFORMIN HCl (500MG  Tablet, Oral) Active. Simvastatin (40MG  Tablet, Oral) Active. Aspirin EC (81MG  Tablet DR, Oral) Active. Avanafil (100MG  Tablet, Oral) Active. Glucosamine Chondr 1500 Complx (Oral) Active. Multivitamin Adults 50+ (Oral)  Active.  Social History ([name], LPN; 45/62/5638 93:73 AM) Alcohol use Occasional alcohol use. Caffeine use Tea. No drug use Tobacco use Current every day smoker.  Family History Aleatha Borer, LPN; 42/87/6811 57:26 AM) Arthritis Mother. Cancer Mother. Respiratory Condition Mother.     Review of Systems ([name] LPN; 20/35/5974 16:38 AM) General Not Present- Appetite Loss, Chills, Fatigue, Fever, Night Sweats, Weight Gain and Weight Loss. Skin Not Present- Change in Wart/Mole, Dryness, Hives, Jaundice, New Lesions, Non-Healing Wounds, Rash and Ulcer. HEENT Not Present- Earache, Hearing Loss, Hoarseness, Nose Bleed, Oral Ulcers, Ringing in the Ears, Seasonal Allergies, Sinus Pain, Sore Throat, Visual Disturbances, Wears glasses/contact lenses and Yellow Eyes. Respiratory Not Present- Bloody sputum, Chronic Cough, Difficulty Breathing, Snoring and Wheezing. Breast Not Present- Breast Mass, Breast Pain, Nipple Discharge and Skin Changes. Cardiovascular Not Present- Chest Pain, Difficulty Breathing Lying Down, Leg Cramps, Palpitations, Rapid Heart Rate, Shortness of Breath and Swelling of Extremities. Gastrointestinal Not Present- Abdominal Pain, Bloating, Bloody Stool, Change in Bowel Habits, Chronic diarrhea, Constipation, Difficulty Swallowing, Excessive gas, Gets full quickly at meals, Hemorrhoids, Indigestion, Nausea, Rectal Pain and Vomiting. Male Genitourinary Present- Frequency, Nocturia and Urgency. Not Present- Blood in Urine, Change in Urinary Stream, Impotence, Painful Urination and Urine Leakage. Musculoskeletal Not Present- Back Pain, Joint Pain, Joint Stiffness, Muscle Pain, Muscle Weakness and Swelling of Extremities. Neurological Not Present- Decreased Memory, Fainting, Headaches, Numbness, Seizures, Tingling, Tremor, Trouble walking and Weakness. Psychiatric Not Present- Anxiety, Bipolar, Change in Sleep Pattern, Depression, Fearful and Frequent  crying. Endocrine Not Present- Cold Intolerance, Excessive Hunger, Hair Changes, Heat Intolerance, Hot flashes and New Diabetes. Hematology Not Present- Easy Bruising, Excessive bleeding, Gland problems, HIV and Persistent Infections.  Vitals ([name] LPN; 45/36/4680 32:12 AM) 04/26/2014 10:10 AM Weight: 202 lb Height: 71.5in Body Surface Area: 2.15 m Body Mass Index: 27.78 kg/m Temp.: 98.37F(Oral)  Pulse: 83 (Regular)  Resp.: 16 (Unlabored)  BP: 108/70 (Sitting, Left Arm, Standard)     Physical Exam [name] MD; 04/26/2014 11:05 AM)  General Mental Status-Alert. General Appearance-Not in acute distress, Not Sickly. Orientation-Oriented X3. Hydration-Well hydrated. Voice-Normal.  Integumentary Global Assessment Upon inspection and palpation of skin surfaces of the - Axillae: non-tender, no inflammation or ulceration, no drainage. and Distribution of scalp and body hair is normal. General Characteristics Temperature - normal warmth is noted.  Head and Neck Head-normocephalic, atraumatic with no lesions or palpable masses. Face Global Assessment - atraumatic, no absence of expression. Neck Global Assessment - no abnormal movements, no bruit auscultated on the right, no bruit auscultated on the left, no decreased range of motion, non-tender. Trachea-midline. Thyroid Gland Characteristics - non-tender.  Eye Eyeball - Left-Extraocular movements intact, No Nystagmus. Eyeball - Right-Extraocular movements intact, No Nystagmus. Cornea - Left-No Hazy. Cornea - Right-No Hazy. Sclera/Conjunctiva - Left-No scleral icterus, No Discharge. Sclera/Conjunctiva - Right-No scleral icterus, No Discharge. Pupil - Left-Direct reaction to light normal. Pupil - Right-Direct reaction to light normal.  ENMT Ears Pinna - Left - no drainage observed, no generalized tenderness observed. Right - no drainage observed, no generalized  tenderness observed. Nose and Sinuses External Inspection of the Nose - no destructive lesion observed. Inspection of the nares - Left - quiet respiration. Right - quiet respiration. Mouth and Throat Lips - Upper Lip - no fissures observed, no pallor noted. Lower Lip - no fissures observed, no pallor noted. Nasopharynx - no discharge present. Oral Cavity/Oropharynx - Tongue - no dryness observed. Oral Mucosa - no cyanosis observed. Hypopharynx - no evidence of airway distress observed.  Chest and Lung Exam Inspection Movements - Normal and Symmetrical. Accessory muscles - No use of accessory muscles in breathing. Palpation Palpation of the chest reveals - Non-tender. Auscultation Breath sounds - Normal and Clear.  Cardiovascular Auscultation Rhythm - Regular. Murmurs & Other Heart Sounds - Auscultation of the heart reveals - No Murmurs and No Systolic Clicks.  Abdomen Inspection Inspection of the abdomen reveals - No Visible peristalsis and No Abnormal pulsations. Umbilicus - No Bleeding, No Urine drainage. Palpation/Percussion Palpation and Percussion of the abdomen reveal - Soft, Non Tender, No Rebound tenderness, No Rigidity (guarding) and No Cutaneous hyperesthesia. Note: Abdomen soft and flat. No umbilical hernias. No diastases.   Male Genitourinary Sexual Maturity Tanner 5 - Adult hair pattern and Adult penile size and shape. Note: Normal external male genitalia. Circumcised. Testes epididymides and cords normal.  Obvious right groin bulge reducible consistent with right inguinal hernia. No definite left inguinal hernia.   Peripheral Vascular Upper Extremity Inspection - Left - No Cyanotic nailbeds, Not Ischemic. Right - No Cyanotic nailbeds, Not Ischemic.  Neurologic Neurologic evaluation reveals -normal attention span and ability to concentrate, able to name objects and repeat phrases. Appropriate fund of knowledge , normal sensation and normal coordination. Mental  Status Affect - not angry, not paranoid. Cranial Nerves-Normal Bilaterally. Gait-Normal.  Neuropsychiatric Mental status exam performed with findings of-able to articulate well with normal speech/language, rate, volume and coherence, thought content normal with ability to perform basic computations and apply abstract reasoning and no evidence of hallucinations, delusions, obsessions or homicidal/suicidal ideation.  Musculoskeletal Global Assessment Spine, Ribs and Pelvis - no instability, subluxation or laxity. Right Upper Extremity - no instability, subluxation or laxity.  Lymphatic Head & Neck  General Head & Neck Lymphatics: Bilateral - Description - No Localized lymphadenopathy. Axillary  General Axillary Region: Bilateral -  Description - No Localized lymphadenopathy. Femoral & Inguinal  Generalized Femoral & Inguinal Lymphatics: Left - Description - No Localized lymphadenopathy. Right - Description - No Localized lymphadenopathy.    Assessment & Plan [name] MD; 04/26/2014 11:07 AM)  REDUCIBLE RIGHT INGUINAL HERNIA (550.90  K40.90) Impression: I think he would benefit from surgery to repair this. An is increased in size and become more symptomatic. 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 I discussed with him. He is interested in proceeding. Plan laparoscopic approach to make sure there is no contralateral hernia. Probably not likely but we will see:  The anatomy & physiology of the abdominal wall and pelvic floor was discussed. The pathophysiology of hernias in the inguinal and pelvic region was discussed. Natural history risks such as progressive enlargement, pain, incarceration & strangulation was discussed. Contributors to complications such as smoking, obesity, diabetes, prior surgery, etc were discussed.  I feel the risks of no intervention will lead to serious problems that outweigh the operative risks;  therefore, I recommended surgery to reduce and repair the hernia. I explained laparoscopic techniques with possible need for an open approach. I noted usual use of mesh to patch and/or buttress hernia repair  Risks such as bleeding, infection, abscess, need for further treatment, heart attack, death, and other risks were discussed. I noted a good likelihood this will help address the problem. Goals of post-operative recovery were discussed as well. Possibility that this will not correct all symptoms was explained. I stressed the importance of low-impact activity, aggressive pain control, avoiding constipation, & not pushing through pain to minimize risk of post-operative chronic pain or injury. Possibility of reherniation was discussed. We will work to minimize complications.  An educational handout further explaining the pathology & treatment options was given as well. Questions were answered. The patient expresses understanding & wishes to proceed with surgery.  Current Plans Schedule for Surgery Pt Education - [facility] Hernia Post-Op HCI (Lacinda Curvin): discussed with patient and provided information. Pt Education - [facility] (Theadora Noyes) Pt Education - [facility] (Willowdean Luhmann)

## 2014-05-22 ENCOUNTER — Encounter (HOSPITAL_COMMUNITY): Payer: Self-pay

## 2014-05-22 ENCOUNTER — Encounter (HOSPITAL_COMMUNITY)
Admission: RE | Admit: 2014-05-22 | Discharge: 2014-05-22 | Disposition: A | Discharge status: Home / Self Care | Payer: 59 | Source: Ambulatory Visit | Attending: Surgery | Admitting: Surgery

## 2014-05-22 DIAGNOSIS — Z7982 Long term (current) use of aspirin: Secondary | ICD-10-CM | POA: Diagnosis not present

## 2014-05-22 DIAGNOSIS — K219 Gastro-esophageal reflux disease without esophagitis: Secondary | ICD-10-CM | POA: Diagnosis not present

## 2014-05-22 DIAGNOSIS — K4020 Bilateral inguinal hernia, without obstruction or gangrene, not specified as recurrent: Secondary | ICD-10-CM | POA: Diagnosis not present

## 2014-05-22 DIAGNOSIS — Z79899 Other long term (current) drug therapy: Secondary | ICD-10-CM | POA: Diagnosis not present

## 2014-05-22 DIAGNOSIS — F17200 Nicotine dependence, unspecified, uncomplicated: Secondary | ICD-10-CM | POA: Diagnosis not present

## 2014-05-22 DIAGNOSIS — K4090 Unilateral inguinal hernia, without obstruction or gangrene, not specified as recurrent: Secondary | ICD-10-CM | POA: Diagnosis present

## 2014-05-22 DIAGNOSIS — E039 Hypothyroidism, unspecified: Secondary | ICD-10-CM | POA: Diagnosis not present

## 2014-05-22 DIAGNOSIS — E119 Type 2 diabetes mellitus without complications: Secondary | ICD-10-CM | POA: Diagnosis not present

## 2014-05-22 DIAGNOSIS — E780 Pure hypercholesterolemia: Secondary | ICD-10-CM | POA: Diagnosis not present

## 2014-05-22 HISTORY — DX: Type 2 diabetes mellitus without complications: E11.9

## 2014-05-22 HISTORY — DX: Chronic obstructive pulmonary disease, unspecified: J44.9

## 2014-05-22 LAB — CBC
HCT: 45.7 % (ref 39.0–52.0)
Hemoglobin: 15.3 g/dL (ref 13.0–17.0)
MCH: 31.1 pg (ref 26.0–34.0)
MCHC: 33.5 g/dL (ref 30.0–36.0)
MCV: 92.9 fL (ref 78.0–100.0)
Platelets: 245 10*3/uL (ref 150–400)
RBC: 4.92 MIL/uL (ref 4.22–5.81)
RDW: 12.7 % (ref 11.5–15.5)
WBC: 6.8 10*3/uL (ref 4.0–10.5)

## 2014-05-22 LAB — BASIC METABOLIC PANEL
ANION GAP: 8 (ref 5–15)
BUN: 15 mg/dL (ref 6–23)
CO2: 27 mmol/L (ref 19–32)
CREATININE: 0.82 mg/dL (ref 0.50–1.35)
Calcium: 9.8 mg/dL (ref 8.4–10.5)
Chloride: 105 mEq/L (ref 96–112)
GLUCOSE: 100 mg/dL — AB (ref 70–99)
Potassium: 4 mmol/L (ref 3.5–5.1)
Sodium: 140 mmol/L (ref 135–145)

## 2014-05-22 NOTE — Patient Instructions (Signed)
[place]  05/22/2014   Your procedure is scheduled on:   05-25-2014 Thursday  Enter through [facility]  Entrance and follow signs to [facility]. Arrive at    0800    AM ..  Call this number if you have problems the morning of surgery: [phone]  Or Presurgical Testing [phone].   For Living Will and/or Health Care Power Attorney Forms: please provide copy for your medical record,may bring AM of surgery(Forms should be already notarized -we do not provide this service).(05-22-14  No information preferred today).      Do not eat food/ or drink: After Midnight.     Take these medicines the morning of surgery with A SIP OF WATER: Levothyroxine.   Do not wear jewelry, make-up or nail polish.  Do not wear deodorant, lotions, powders, or perfumes.   Do not shave legs and under arms- 48 hours(2 days) prior to first CHG shower.(Shaving face and neck okay.)  Do not bring valuables to the hospital.(Hospital is not responsible for lost valuables).  Contacts, dentures or removable bridgework, body piercing, hair pins may not be worn into surgery.  Leave suitcase in the car. After surgery it may be brought to your room.  For patients admitted to the hospital, checkout time is 11:00 AM the day of discharge.(Restricted visitors-Any Persons displaying flu-like symptoms or illness).    Patients discharged the day of surgery will not be allowed to drive home. Must have responsible person with you x 24 hours once discharged.  Name and phone number of your driver: Nita-spouse [phone] cell     Please read over the following fact sheets that you were given:  CHG(Chlorhexidine Gluconate 4% Surgical Soap) use.        [facility] - Preparing for Surgery Before surgery, you can play an important role.  Because skin is not sterile, your skin needs to be as free of germs as possible.  You can reduce the number of germs on your skin by washing with CHG (chlorahexidine gluconate)  soap before surgery.  CHG is an antiseptic cleaner which kills germs and bonds with the skin to continue killing germs even after washing. Please DO NOT use if you have an allergy to CHG or antibacterial soaps.  If your skin becomes reddened/irritated stop using the CHG and inform your nurse when you arrive at Short Stay. Do not shave (including legs and underarms) for at least 48 hours prior to the first CHG shower.  You may shave your face/neck. Please follow these instructions carefully:  1.  Shower with CHG Soap the night before surgery and the  morning of Surgery.  2.  If you choose to wash your hair, wash your hair first as usual with your  normal  shampoo.  3.  After you shampoo, rinse your hair and body thoroughly to remove the  shampoo.                           4.  Use CHG as you would any other liquid soap.  You can apply chg directly  to the skin and wash                       Gently with a scrungie or clean washcloth.  5.  Apply the CHG Soap to your body ONLY FROM THE NECK DOWN.   Do not use on face/ open  Wound or open sores. Avoid contact with eyes, ears mouth and genitals (private parts).                       Wash face,  Genitals (private parts) with your normal soap.             6.  Wash thoroughly, paying special attention to the area where your surgery  will be performed.  7.  Thoroughly rinse your body with warm water from the neck down.  8.  DO NOT shower/wash with your normal soap after using and rinsing off  the CHG Soap.                9.  Pat yourself dry with a clean towel.            10.  Wear clean pajamas.            11.  Place clean sheets on your bed the night of your first shower and do not  sleep with pets. Day of Surgery : Do not apply any lotions/deodorants the morning of surgery.  Please wear clean clothes to the hospital/surgery center.  FAILURE TO FOLLOW THESE INSTRUCTIONS MAY RESULT IN THE CANCELLATION OF YOUR SURGERY PATIENT  SIGNATURE_________________________________  NURSE SIGNATURE__________________________________  ________________________________________________________________________

## 2014-05-24 NOTE — Anesthesia Preprocedure Evaluation (Addendum)
Anesthesia Evaluation  Patient identified by MRN, date of birth, ID band Patient awake    Reviewed: Allergy & Precautions, NPO status , Patient's Chart, lab work & pertinent test results  History of Anesthesia Complications Negative for: history of anesthetic complications  Airway Mallampati: II  TM Distance: >3 FB Neck ROM: Full    Dental no notable dental hx. (+) Dental Advisory Given, Missing   Pulmonary Current Smoker,  Told by a physician that he would develop COPD if he did not quit smoking but does not have that formal diagnosis breath sounds clear to auscultation  Pulmonary exam normal       Cardiovascular negative cardio ROS  Rhythm:Regular Rate:Normal     Neuro/Psych negative neurological ROS  negative psych ROS   GI/Hepatic Neg liver ROS, GERD-  Medicated and Controlled,  Endo/Other  diabetes, Type 2, Oral Hypoglycemic AgentsHypothyroidism   Renal/GU negative Renal ROS  negative genitourinary   Musculoskeletal negative musculoskeletal ROS (+)   Abdominal   Peds negative pediatric ROS (+)  Hematology negative hematology ROS (+)   Anesthesia Other Findings   Reproductive/Obstetrics negative OB ROS                            Anesthesia Physical Anesthesia Plan  ASA: II  Anesthesia Plan: General   Post-op Pain Management:    Induction: Intravenous  Airway Management Planned: Oral ETT  Additional Equipment:   Intra-op Plan:   Post-operative Plan: Extubation in OR  Informed Consent: I have reviewed the patients History and Physical, chart, labs and discussed the procedure including the risks, benefits and alternatives for the proposed anesthesia with the patient or authorized representative who has indicated his/her understanding and acceptance.   Dental advisory given  Plan Discussed with: CRNA  Anesthesia Plan Comments:         Anesthesia Quick  Evaluation

## 2014-05-25 ENCOUNTER — Encounter (HOSPITAL_COMMUNITY)
Admission: RE | Disposition: A | Discharge status: Home / Self Care | Payer: Self-pay | Source: Ambulatory Visit | Attending: Surgery

## 2014-05-25 ENCOUNTER — Encounter (HOSPITAL_COMMUNITY): Payer: Self-pay | Admitting: *Deleted

## 2014-05-25 ENCOUNTER — Ambulatory Visit (HOSPITAL_COMMUNITY): Payer: 59 | Admitting: Anesthesiology

## 2014-05-25 ENCOUNTER — Ambulatory Visit (HOSPITAL_COMMUNITY)
Admission: RE | Admit: 2014-05-25 | Discharge: 2014-05-25 | Disposition: A | Discharge status: Home / Self Care | Payer: 59 | Source: Ambulatory Visit | Attending: Surgery | Admitting: Surgery

## 2014-05-25 DIAGNOSIS — E780 Pure hypercholesterolemia: Secondary | ICD-10-CM | POA: Insufficient documentation

## 2014-05-25 DIAGNOSIS — E119 Type 2 diabetes mellitus without complications: Secondary | ICD-10-CM | POA: Insufficient documentation

## 2014-05-25 DIAGNOSIS — Z79899 Other long term (current) drug therapy: Secondary | ICD-10-CM | POA: Insufficient documentation

## 2014-05-25 DIAGNOSIS — K4020 Bilateral inguinal hernia, without obstruction or gangrene, not specified as recurrent: Secondary | ICD-10-CM | POA: Insufficient documentation

## 2014-05-25 DIAGNOSIS — Z7982 Long term (current) use of aspirin: Secondary | ICD-10-CM | POA: Insufficient documentation

## 2014-05-25 DIAGNOSIS — E039 Hypothyroidism, unspecified: Secondary | ICD-10-CM | POA: Insufficient documentation

## 2014-05-25 DIAGNOSIS — F17200 Nicotine dependence, unspecified, uncomplicated: Secondary | ICD-10-CM | POA: Insufficient documentation

## 2014-05-25 DIAGNOSIS — K219 Gastro-esophageal reflux disease without esophagitis: Secondary | ICD-10-CM | POA: Insufficient documentation

## 2014-05-25 HISTORY — PX: INGUINAL HERNIA REPAIR: SHX194

## 2014-05-25 HISTORY — PX: INSERTION OF MESH: SHX5868

## 2014-05-25 LAB — GLUCOSE, CAPILLARY
GLUCOSE-CAPILLARY: 101 mg/dL — AB (ref 70–99)
GLUCOSE-CAPILLARY: 131 mg/dL — AB (ref 70–99)

## 2014-05-25 SURGERY — REPAIR, HERNIA, INGUINAL, LAPAROSCOPIC
Anesthesia: General | Site: Groin | Laterality: Bilateral

## 2014-05-25 MED ORDER — BUPIVACAINE-EPINEPHRINE (PF) 0.25% -1:200000 IJ SOLN
INTRAMUSCULAR | Status: AC
  Filled 2014-05-25: qty 60

## 2014-05-25 MED ORDER — ONDANSETRON HCL 4 MG/2ML IJ SOLN: 4.0000 mg | Freq: Once | INTRAMUSCULAR | Status: DC | PRN

## 2014-05-25 MED ORDER — ONDANSETRON HCL 4 MG/2ML IJ SOLN
INTRAMUSCULAR | Status: DC | PRN
  Administered 2014-05-25: 4 mg via INTRAVENOUS

## 2014-05-25 MED ORDER — CEFAZOLIN SODIUM-DEXTROSE 2-3 GM-% IV SOLR
2.0000 g | INTRAVENOUS | Status: AC
  Administered 2014-05-25: 2 g via INTRAVENOUS

## 2014-05-25 MED ORDER — CEFAZOLIN SODIUM-DEXTROSE 2-3 GM-% IV SOLR
INTRAVENOUS | Status: AC
  Filled 2014-05-25: qty 50

## 2014-05-25 MED ORDER — MIDAZOLAM HCL 2 MG/2ML IJ SOLN
INTRAMUSCULAR | Status: AC
  Filled 2014-05-25: qty 2

## 2014-05-25 MED ORDER — ACETAMINOPHEN 160 MG/5ML PO SOLN
325.0000 mg | ORAL | Status: DC | PRN
  Filled 2014-05-25: qty 20.3

## 2014-05-25 MED ORDER — NEOSTIGMINE METHYLSULFATE 10 MG/10ML IV SOLN
INTRAVENOUS | Status: AC
  Filled 2014-05-25: qty 1

## 2014-05-25 MED ORDER — DEXAMETHASONE SODIUM PHOSPHATE 10 MG/ML IJ SOLN
INTRAMUSCULAR | Status: AC
  Filled 2014-05-25: qty 1

## 2014-05-25 MED ORDER — BUPIVACAINE-EPINEPHRINE (PF) 0.25% -1:200000 IJ SOLN
INTRAMUSCULAR | Status: AC
  Filled 2014-05-25: qty 30

## 2014-05-25 MED ORDER — NEOSTIGMINE METHYLSULFATE 10 MG/10ML IV SOLN
INTRAVENOUS | Status: DC | PRN
Start: 2014-05-25 — End: 2014-05-25
  Administered 2014-05-25: 5 mg via INTRAVENOUS

## 2014-05-25 MED ORDER — GLYCOPYRROLATE 0.2 MG/ML IJ SOLN
INTRAMUSCULAR | Status: DC | PRN
  Administered 2014-05-25: 0.6 mg via INTRAVENOUS

## 2014-05-25 MED ORDER — PROPOFOL 10 MG/ML IV BOLUS
INTRAVENOUS | Status: DC | PRN
  Administered 2014-05-25: 200 mg via INTRAVENOUS

## 2014-05-25 MED ORDER — LIDOCAINE HCL (CARDIAC) 20 MG/ML IV SOLN
INTRAVENOUS | Status: AC
  Filled 2014-05-25: qty 5

## 2014-05-25 MED ORDER — OXYCODONE HCL 5 MG PO TABS: 5.0000 mg | ORAL_TABLET | ORAL | Status: DC | PRN

## 2014-05-25 MED ORDER — STERILE WATER FOR IRRIGATION IR SOLN
Status: DC | PRN
  Administered 2014-05-25: 1500 mL

## 2014-05-25 MED ORDER — CHLORHEXIDINE GLUCONATE 4 % EX LIQD
1.0000 | Freq: Once | CUTANEOUS | Status: DC
Start: 2014-05-26 — End: 2014-05-25

## 2014-05-25 MED ORDER — 0.9 % SODIUM CHLORIDE (POUR BTL) OPTIME
TOPICAL | Status: DC | PRN
  Administered 2014-05-25: 1000 mL

## 2014-05-25 MED ORDER — FENTANYL CITRATE 0.05 MG/ML IJ SOLN
INTRAMUSCULAR | Status: AC
  Filled 2014-05-25: qty 5

## 2014-05-25 MED ORDER — MIDAZOLAM HCL 5 MG/5ML IJ SOLN
INTRAMUSCULAR | Status: DC | PRN
  Administered 2014-05-25: 2 mg via INTRAVENOUS

## 2014-05-25 MED ORDER — CHLORHEXIDINE GLUCONATE 4 % EX LIQD: 1.0000 "application " | Freq: Once | CUTANEOUS | Status: DC

## 2014-05-25 MED ORDER — FENTANYL CITRATE 0.05 MG/ML IJ SOLN
INTRAMUSCULAR | Status: DC | PRN
  Administered 2014-05-25 (×5): 50 ug via INTRAVENOUS

## 2014-05-25 MED ORDER — ONDANSETRON HCL 4 MG/2ML IJ SOLN
INTRAMUSCULAR | Status: AC
  Filled 2014-05-25: qty 2

## 2014-05-25 MED ORDER — PROPOFOL 10 MG/ML IV BOLUS
INTRAVENOUS | Status: AC
  Filled 2014-05-25: qty 20

## 2014-05-25 MED ORDER — LACTATED RINGERS IV SOLN
INTRAVENOUS | Status: DC
  Administered 2014-05-25: 1000 mL via INTRAVENOUS
  Administered 2014-05-25: 11:00:00 via INTRAVENOUS

## 2014-05-25 MED ORDER — SUCCINYLCHOLINE CHLORIDE 20 MG/ML IJ SOLN
INTRAMUSCULAR | Status: DC | PRN
  Administered 2014-05-25: 100 mg via INTRAVENOUS

## 2014-05-25 MED ORDER — LIDOCAINE HCL (CARDIAC) 20 MG/ML IV SOLN
INTRAVENOUS | Status: DC | PRN
  Administered 2014-05-25: 100 mg via INTRAVENOUS

## 2014-05-25 MED ORDER — FENTANYL CITRATE 0.05 MG/ML IJ SOLN: 25.0000 ug | INTRAMUSCULAR | Status: DC | PRN

## 2014-05-25 MED ORDER — KETOROLAC TROMETHAMINE 30 MG/ML IJ SOLN
INTRAMUSCULAR | Status: AC
  Filled 2014-05-25: qty 1

## 2014-05-25 MED ORDER — BUPIVACAINE-EPINEPHRINE 0.25% -1:200000 IJ SOLN
INTRAMUSCULAR | Status: DC | PRN
Start: 2014-05-25 — End: 2014-05-25
  Administered 2014-05-25: 90 mL

## 2014-05-25 MED ORDER — GLYCOPYRROLATE 0.2 MG/ML IJ SOLN
INTRAMUSCULAR | Status: AC
  Filled 2014-05-25: qty 3

## 2014-05-25 MED ORDER — ACETAMINOPHEN 325 MG PO TABS: 325.0000 mg | ORAL_TABLET | ORAL | Status: DC | PRN

## 2014-05-25 MED ORDER — DEXAMETHASONE SODIUM PHOSPHATE 10 MG/ML IJ SOLN
INTRAMUSCULAR | Status: DC | PRN
  Administered 2014-05-25: 10 mg via INTRAVENOUS

## 2014-05-25 MED ORDER — ROCURONIUM BROMIDE 100 MG/10ML IV SOLN
INTRAVENOUS | Status: DC | PRN
  Administered 2014-05-25: 40 mg via INTRAVENOUS
  Administered 2014-05-25: 20 mg via INTRAVENOUS

## 2014-05-25 SURGICAL SUPPLY — 26 items
CABLE HIGH FREQUENCY MONO STRZ (ELECTRODE) ×2 IMPLANT
CHLORAPREP W/TINT 26ML (MISCELLANEOUS) ×2 IMPLANT
DECANTER SPIKE VIAL GLASS SM (MISCELLANEOUS) IMPLANT
DEVICE SECURE STRAP 25 ABSORB (INSTRUMENTS) IMPLANT
DRAPE LAPAROSCOPIC ABDOMINAL (DRAPES) ×2 IMPLANT
DRAPE WARM FLUID 44X44 (DRAPE) ×2 IMPLANT
DRSG TEGADERM 2-3/8X2-3/4 SM (GAUZE/BANDAGES/DRESSINGS) ×4 IMPLANT
DRSG TEGADERM 4X4.75 (GAUZE/BANDAGES/DRESSINGS) ×2 IMPLANT
ELECT REM PT RETURN 9FT ADLT (ELECTROSURGICAL) ×2
ELECTRODE REM PT RTRN 9FT ADLT (ELECTROSURGICAL) ×1 IMPLANT
GLOVE ECLIPSE 8.0 STRL XLNG CF (GLOVE) ×4 IMPLANT
GLOVE INDICATOR 8.0 STRL GRN (GLOVE) ×4 IMPLANT
GOWN STRL REUS W/TWL XL LVL3 (GOWN DISPOSABLE) ×10 IMPLANT
KIT BASIN OR (CUSTOM PROCEDURE TRAY) ×2 IMPLANT
MESH ULTRAPRO 6X6 15CM15CM (Mesh General) ×4 IMPLANT
SCISSORS LAP 5X35 DISP (ENDOMECHANICALS) ×2 IMPLANT
SET IRRIG TUBING LAPAROSCOPIC (IRRIGATION / IRRIGATOR) IMPLANT
SLEEVE XCEL OPT CAN 5 100 (ENDOMECHANICALS) ×2 IMPLANT
SUT MNCRL AB 4-0 PS2 18 (SUTURE) ×2 IMPLANT
SUT VICRYL 0 UR6 27IN ABS (SUTURE) ×2 IMPLANT
TACKER 5MM HERNIA 3.5CML NAB (ENDOMECHANICALS) IMPLANT
TOWEL OR 17X26 10 PK STRL BLUE (TOWEL DISPOSABLE) ×2 IMPLANT
TOWEL OR NON WOVEN STRL DISP B (DISPOSABLE) ×2 IMPLANT
TRAY LAPAROSCOPIC (CUSTOM PROCEDURE TRAY) ×2 IMPLANT
TROCAR BLADELESS OPT 5 100 (ENDOMECHANICALS) ×2 IMPLANT
TROCAR XCEL BLUNT TIP 100MML (ENDOMECHANICALS) ×2 IMPLANT

## 2014-05-25 NOTE — Interval H&P Note (Signed)
History and Physical Interval Note:  05/25/2014 9:01 AM  Tracy Turner  has presented today for surgery, with the diagnosis of Right Inguinal Hernia  The various methods of treatment have been discussed with the patient and family. After consideration of risks, benefits and other options for treatment, the patient has consented to  Procedure(s): LAPAROSCOPIC RIGHT INGUINAL HERNIA WITH MESH (Right) INSERTION OF MESH (Right) as a surgical intervention .  The patient's history has been reviewed, patient examined, no change in status, stable for surgery.  I have reviewed the patient's chart and labs.  Questions were answered to the patient's satisfaction.     Ionia Schey C.

## 2014-05-25 NOTE — Anesthesia Postprocedure Evaluation (Signed)
  Anesthesia Post-op Note  Patient: Tracy Turner  Procedure(s) Performed: Procedure(s) (LRB): BILATERAL LAPAROSCOPIC INGUINAL HERNIA WITH MESH (Bilateral) INSERTION OF MESH (Bilateral)  Patient Location: PACU  Anesthesia Type: General  Level of Consciousness: awake and alert   Airway and Oxygen Therapy: Patient Spontanous Breathing  Post-op Pain: mild  Post-op Assessment: Post-op Vital signs reviewed, Patient's Cardiovascular Status Stable, Respiratory Function Stable, Patent Airway and No signs of Nausea or vomiting  Last Vitals:  Filed Vitals:   05/25/14 1125  BP: 153/80  Pulse: 75  Temp: 36.4 C  Resp: 15    Post-op Vital Signs: stable   Complications: No apparent anesthesia complications

## 2014-05-25 NOTE — Op Note (Signed)
05/25/2014  11:23 AM  PATIENT:  Tracy Turner  59 y.o. male  Patient Care Team: Marletta Lor, MD as PCP - General  PRE-OPERATIVE DIAGNOSIS:  Right Inguinal Hernia  POST-OPERATIVE DIAGNOSIS:    BILATERAL Inguinal Hernia  PROCEDURE:    BILATERAL LAPAROSCOPIC INGUINAL HERNIA WITH MESH INSERTION OF MESH  SURGEON:  Surgeon(s): Michael Boston, MD  ASSISTANT: RN   ANESTHESIA:   local and general  EBL:  Total I/O In: 1000 [I.V.:1000] Out: -   Delay start of Pharmacological VTE agent (>24hrs) due to surgical blood loss or risk of bleeding:  no  DRAINS: none   SPECIMEN:  No Specimen  DISPOSITION OF SPECIMEN:  N/A  COUNTS:  YES  PLAN OF CARE: Discharge to home after PACU  PATIENT DISPOSITION:  PACU - hemodynamically stable.  INDICATION: Pleasant active male with obvious right groin bulging.  Definite hernia.  Subtle bulge on Valsalva on the left side.  I recommended laparoscopic exploration and repair of hernias found:  The anatomy & physiology of the abdominal wall and pelvic floor was discussed.  The pathophysiology of hernias in the inguinal and pelvic region was discussed.  Natural history risks such as progressive enlargement, pain, incarceration & strangulation was discussed.   Contributors to complications such as smoking, obesity, diabetes, prior surgery, etc were discussed.    I feel the risks of no intervention will lead to serious problems that outweigh the operative risks; therefore, I recommended surgery to reduce and repair the hernia.  I explained laparoscopic techniques with possible need for an open approach.  I noted usual use of mesh to patch and/or buttress hernia repair  Risks such as bleeding, infection, abscess, need for further treatment, heart attack, death, and other risks were discussed.  I noted a good likelihood this will help address the problem.   Goals of post-operative recovery were discussed as well.  Possibility that this will not correct  all symptoms was explained.  I stressed the importance of low-impact activity, aggressive pain control, avoiding constipation, & not pushing through pain to minimize risk of post-operative chronic pain or injury. Possibility of reherniation was discussed.  We will work to minimize complications.     An educational handout further explaining the pathology & treatment options was given as well.  Questions were answered.  The patient expresses understanding & wishes to proceed with surgery.  OR FINDINGS: Bilateral inguinal hernias R>>L  DESCRIPTION:   The patient was identified & brought into the operating room. The patient was positioned supine with arms tucked. SCDs were active during the entire case. The patient underwent general anesthesia without any difficulty.  The abdomen was prepped and draped in a sterile fashion. The patient's bladder was emptied.  A Surgical Timeout confirmed our plan.  I made a transverse incision through the inferior umbilical fold.  I made a small transverse nick through the anterior rectus fascia contralateral to the inguinal hernia side and placed a 0-vicryl stitch through the fascia.  I placed a Hasson trocar into the preperitoneal plane.  Entry was clean.  We induced carbon dioxide insufflation. Camera inspection revealed no injury.  I used a 60mm angled scope to bluntly free the peritoneum off the infraumbilical anterior abdominal wall.  I created enough of a preperitoneal pocket to place 32mm ports into the right & left mid-abdomen into this preperitoneal cavity.  I focused attention on the right side since that was the dominant hernia side.   I used blunt & focused sharp dissection  to free the peritoneum off the flank and down to the pubic rim.  I freed the anteriolateral bladder wall off the anteriolateral pelvic wall, sparing midline attachments.   I located a swath of peritoneum going into a hernia fascial defect at the internal ring consistent with an indirect  inguinal hernia.  I gradually freed the peritoneal hernia sac off safely and reduced it into the preperitoneal space.  I freed the peritoneum off the spermatic vessels & vas deferens.  I freed peritoneum off the retroperitoneum along the psoas muscle.    I checked & assured hemostasis.    I turned attention on the opposite side.  I did dissection in a similar, mirror-image fashion. The patient had a small indirect inguinal hernia.   There were no breaches in peritoneum that required closure.  I chose 15x15 cm sheets of ultra-lightweight polypropylene mesh (Ultrapro), one for each side.  I cut a single sigmoid-shaped slit ~6cm from a corner of each mesh.  I placed the meshes into the preperitoneal space & laid them as overlapping diamonds such that at the inferior points, a 6x6 cm corner flap rested in the true anterolateral pelvis, covering the obturator & femoral foramina.   I allowed the bladder to fall back and help tuck the corners of the mesh in.  The medial corners overlapped each other across midline cephalad to the pubic rim.   This provided >2 inch coverage around the hernia.  Because the defects well covered and not particularly large, I did not need tacks to hold the mesh in place  I held the hernia sacs cephalad & evacuated carbon dioxide.  I closed the fascia  With absorbable suture.  I closed the skin using 4-0 monocryl stitch.  Sterile dressings were applied. The patient was extubated & arrived in the PACU in stable condition..  I had discussed postoperative care with the patient in the holding area.   I did discuss operative findings and postoperative goals / instructions to family as well.  Instructions are written in the chart.  [name], M.D., F.A.C.S. Gastrointestinal and Minimally Invasive Surgery [facility] [address] [place], [place] [id number] [phone] Main / Paging

## 2014-05-25 NOTE — Interval H&P Note (Signed)
History and Physical Interval Note:  05/25/2014 9:02 AM  Tracy Turner  has presented today for surgery, with the diagnosis of Right Inguinal Hernia  The various methods of treatment have been discussed with the patient and family. After consideration of risks, benefits and other options for treatment, the patient has consented to  Procedure(s): LAPAROSCOPIC RIGHT INGUINAL HERNIA WITH MESH (Right) INSERTION OF MESH (Right) as a surgical intervention .  The patient's history has been reviewed, patient examined, no change in status, stable for surgery.  I have reviewed the patient's chart and labs.  Questions were answered to the patient's satisfaction.     Jordis Repetto C.

## 2014-05-25 NOTE — Anesthesia Procedure Notes (Signed)
Procedure Name: Intubation Date/Time: 05/25/2014 9:42 AM Performed by: Lind Covert Pre-anesthesia Checklist: Timeout performed, Patient identified, Emergency Drugs available, Suction available and Patient being monitored Patient Re-evaluated:Patient Re-evaluated prior to inductionOxygen Delivery Method: Circle system utilized Preoxygenation: Pre-oxygenation with 100% oxygen Intubation Type: IV induction Ventilation: Mask ventilation without difficulty Laryngoscope Size: Mac and 4 Grade View: Grade I Tube type: Oral Tube size: 7.5 mm Number of attempts: 1 Airway Equipment and Method: Stylet Placement Confirmation: ETT inserted through vocal cords under direct vision,  breath sounds checked- equal and bilateral and positive ETCO2 Secured at: 22 cm Tube secured with: Tape Dental Injury: Teeth and Oropharynx as per pre-operative assessment

## 2014-05-25 NOTE — Transfer of Care (Signed)
Immediate Anesthesia Transfer of Care Note  Patient: Tracy Turner  Procedure(s) Performed: Procedure(s): BILATERAL LAPAROSCOPIC INGUINAL HERNIA WITH MESH (Bilateral) INSERTION OF MESH (Bilateral)  Patient Location: PACU  Anesthesia Type:General  Level of Consciousness: sedated  Airway & Oxygen Therapy: Patient Spontanous Breathing and Patient connected to face mask oxygen  Post-op Assessment: Report given to PACU RN and Post -op Vital signs reviewed and stable  Post vital signs: Reviewed and stable  Complications: No apparent anesthesia complications

## 2014-05-25 NOTE — Discharge Instructions (Signed)
HERNIA REPAIR: POST OP INSTRUCTIONS ° °1. DIET: Follow a light bland diet the first 24 hours after arrival home, such as soup, liquids, crackers, etc.  Be sure to include lots of fluids daily.  Avoid fast food or heavy meals as your are more likely to get nauseated.  Eat a low fat the next few days after surgery. °2. Take your usually prescribed home medications unless otherwise directed. °3. PAIN CONTROL: °a. Pain is best controlled by a usual combination of three different methods TOGETHER: °i. Ice/Heat °ii. Over the counter pain medication °iii. Prescription pain medication °b. Most patients will experience some swelling and bruising around the hernia(s) such as the bellybutton, groins, or old incisions.  Ice packs or heating pads (30-60 minutes up to 6 times a day) will help. Use ice for the first few days to help decrease swelling and bruising, then switch to heat to help relax tight/sore spots and speed recovery.  Some people prefer to use ice alone, heat alone, alternating between ice & heat.  Experiment to what works for you.  Swelling and bruising can take several weeks to resolve.   °c. It is helpful to take an over-the-counter pain medication regularly for the first few weeks.  Choose one of the following that works best for you: °i. Naproxen (Aleve, etc)  Two 220mg tabs twice a day °ii. Ibuprofen (Advil, etc) Three 200mg tabs four times a day (every meal & bedtime) °iii. Acetaminophen (Tylenol, etc) 325-650mg four times a day (every meal & bedtime) °d. A  prescription for pain medication should be given to you upon discharge.  Take your pain medication as prescribed.  °i. If you are having problems/concerns with the prescription medicine (does not control pain, nausea, vomiting, rash, itching, etc), please call us [phone] to see if we need to switch you to a different pain medicine that will work better for you and/or control your side effect better. °ii. If you need a refill on your pain  medication, please contact your pharmacy.  They will contact our office to request authorization. Prescriptions will not be filled after 5 pm or on week-ends. °4. Avoid getting constipated.  Between the surgery and the pain medications, it is common to experience some constipation.  Increasing fluid intake and taking a fiber supplement (such as Metamucil, Citrucel, FiberCon, MiraLax, etc) 1-2 times a day regularly will usually help prevent this problem from occurring.  A mild laxative (prune juice, Milk of Magnesia, MiraLax, etc) should be taken according to package directions if there are no bowel movements after 48 hours.   °5. Wash / shower every day.  You may shower over the dressings as they are waterproof.   °6. Remove your waterproof bandages 5 days after surgery.  You may leave the incision open to air.  You may replace a dressing/Band-Aid to cover the incision for comfort if you wish.  Continue to shower over incision(s) after the dressing is off. ° ° ° °7. ACTIVITIES as tolerated:   °a. You may resume regular (light) daily activities beginning the next day--such as daily self-care, walking, climbing stairs--gradually increasing activities as tolerated.  If you can walk 30 minutes without difficulty, it is safe to try more intense activity such as jogging, treadmill, bicycling, low-impact aerobics, swimming, etc. °b. Save the most intensive and strenuous activity for last such as sit-ups, heavy lifting, contact sports, etc  Refrain from any heavy lifting or straining until you are off narcotics for pain control.   °  c. DO NOT PUSH THROUGH PAIN.  Let pain be your guide: If it hurts to do something, don't do it.  Pain is your body warning you to avoid that activity for another week until the pain goes down. d. You may drive when you are no longer taking prescription pain medication, you can comfortably wear a seatbelt, and you can safely maneuver your car and apply brakes. e. Dennis Bast may have sexual intercourse  when it is comfortable.  8. FOLLOW UP in our office a. Please call [facility] at [phone] to set up an appointment to see your surgeon in the office for a follow-up appointment approximately 2-3 weeks after your surgery. b. Make sure that you call for this appointment the day you arrive home to insure a convenient appointment time. 9.  IF YOU HAVE DISABILITY OR FAMILY LEAVE FORMS, BRING THEM TO THE OFFICE FOR PROCESSING.  DO NOT GIVE THEM TO YOUR DOCTOR.  WHEN TO CALL us [phone]: 1. Poor pain control 2. Reactions / problems with new medications (rash/itching, nausea, etc)  3. Fever over 101.5 F (38.5 C) 4. Inability to urinate 5. Nausea and/or vomiting 6. Worsening swelling or bruising 7. Continued bleeding from incision. 8. Increased pain, redness, or drainage from the incision   The clinic staff is available to answer your questions during regular business hours (8:30am-5pm).  Please dont hesitate to call and ask to speak to one of our nurses for clinical concerns.   If you have a medical emergency, go to the nearest emergency room or call 911.  A surgeon from [facility] is always on call at the hospitals in [facility], [place], [place], [place], [place]  [address] ?  [address] [address], [place], [place]   [address] MAIN: [phone] ? TOLL FREE: [phone] ? FAX: [phone] [email or web address]  Managing Pain  Pain after surgery or related to activity is often due to strain/injury to muscle, tendon, nerves and/or incisions.  This pain is usually short-term and will improve in a few months.   Many people find it helpful to do the following things TOGETHER to help speed the process of healing and to get back to regular activity more quickly:  1. Avoid heavy physical activity a.  no lifting greater than 20 pounds b. Do not push through the pain.  Listen to your body and avoid positions and maneuvers than  reproduce the pain c. Walking is okay as tolerated, but go slowly and stop when getting sore.  d. Remember: If it hurts to do it, then dont do it! 2. Take Anti-inflammatory medication  a. Take with food/snack around the clock for 1-2 weeks i. This helps the muscle and nerve tissues become less irritable and calm down faster b. Choose ONE of the following over-the-counter medications: i. Naproxen 252m tabs (ex. Aleve) 1-2 pills twice a day  ii. Ibuprofen 2072mtabs (ex. Advil, Motrin) 3-4 pills with every meal and just before bedtime iii. Acetaminophen 50062mabs (Tylenol) 1-2 pills with every meal and just before bedtime 3. Use a Heating pad or Ice/Cold Pack a. 4-6 times a day b. May use warm bath/hottub  or showers 4. Try Gentle Massage and/or Stretching  a. at the area of pain many times a day b. stop if you feel pain - do not overdo it  Try these steps together to help you body heal faster and avoid making things get worse.  Doing just one of these  things may not be enough.    If you are not getting better after two weeks or are noticing you are getting worse, contact our office for further advice; we may need to re-evaluate you & see what other things we can do to help.  GETTING TO GOOD BOWEL HEALTH. Irregular bowel habits such as constipation and diarrhea can lead to many problems over time.  Having one soft bowel movement a day is the most important way to prevent further problems.  The anorectal canal is designed to handle stretching and feces to safely manage our ability to get rid of solid waste (feces, poop, stool) out of our body.  BUT, hard constipated stools can act like ripping concrete bricks and diarrhea can be a burning fire to this very sensitive area of our body, causing inflamed hemorrhoids, anal fissures, increasing risk is perirectal abscesses, abdominal pain/bloating, an making irritable bowel worse.     The goal: ONE SOFT BOWEL MOVEMENT A DAY!  To have soft, regular  bowel movements:   Drink at least 8 tall glasses of water a day.    Take plenty of fiber.  Fiber is the undigested part of plant food that passes into the colon, acting s natures broom to encourage bowel motility and movement.  Fiber can absorb and hold large amounts of water. This results in a larger, bulkier stool, which is soft and easier to pass. Work gradually over several weeks up to 6 servings a day of fiber (25g a day even more if needed) in the form of: o Vegetables -- Root (potatoes, carrots, turnips), leafy green (lettuce, salad greens, celery, spinach), or cooked high residue (cabbage, broccoli, etc) o Fruit -- Fresh (unpeeled skin & pulp), Dried (prunes, apricots, cherries, etc ),  or stewed ( applesauce)  o Whole grain breads, pasta, etc (whole wheat)  o Bran cereals   Bulking Agents -- This type of water-retaining fiber generally is easily obtained each day by one of the following:  o Psyllium bran -- The psyllium plant is remarkable because its ground seeds can retain so much water. This product is available as Metamucil, Konsyl, Effersyllium, Per Diem Fiber, or the less expensive generic preparation in drug and health food stores. Although labeled a laxative, it really is not a laxative.  o Methylcellulose -- This is another fiber derived from wood which also retains water. It is available as Citrucel. o Polyethylene Glycol - and artificial fiber commonly called Miralax or Glycolax.  It is helpful for people with gassy or bloated feelings with regular fiber o Flax Seed - a less gassy fiber than psyllium  No reading or other relaxing activity while on the toilet. If bowel movements take longer than 5 minutes, you are too constipated  AVOID CONSTIPATION.  High fiber and water intake usually takes care of this.  Sometimes a laxative is needed to stimulate more frequent bowel movements, but   Laxatives are not a good long-term solution as it can wear the colon out. o Osmotics  (Milk of Magnesia, Fleets phosphosoda, Magnesium citrate, MiraLax, GoLytely) are safer than  o Stimulants (Senokot, Castor Oil, Dulcolax, Ex Lax)    o Do not take laxatives for more than 7days in a row.   IF SEVERELY CONSTIPATED, try a Bowel Retraining Program: o Do not use laxatives.  o Eat a diet high in roughage, such as bran cereals and leafy vegetables.  o Drink six (6) ounces of prune or apricot juice each morning.  o Eat  two (2) large servings of stewed fruit each day.  o Take one (1) heaping tablespoon of a psyllium-based bulking agent twice a day. Use sugar-free sweetener when possible to avoid excessive calories.  o Eat a normal breakfast.  o Set aside 15 minutes after breakfast to sit on the toilet, but do not strain to have a bowel movement.  o If you do not have a bowel movement by the third day, use an enema and repeat the above steps.   Controlling diarrhea o Switch to liquids and simpler foods for a few days to avoid stressing your intestines further. o Avoid dairy products (especially milk & ice cream) for a short time.  The intestines often can lose the ability to digest lactose when stressed. o Avoid foods that cause gassiness or bloating.  Typical foods include beans and other legumes, cabbage, broccoli, and dairy foods.  Every person has some sensitivity to other foods, so listen to our body and avoid those foods that trigger problems for you. o Adding fiber (Citrucel, Metamucil, psyllium, Miralax) gradually can help thicken stools by absorbing excess fluid and retrain the intestines to act more normally.  Slowly increase the dose over a few weeks.  Too much fiber too soon can backfire and cause cramping & bloating. o Probiotics (such as active yogurt, Align, etc) may help repopulate the intestines and colon with normal bacteria and calm down a sensitive digestive tract.  Most studies show it to be of mild help, though, and such products can be costly. o Medicines: - Bismuth  subsalicylate (ex. Kayopectate, Pepto Bismol) every 30 minutes for up to 6 doses can help control diarrhea.  Avoid if pregnant. - Loperamide (Immodium) can slow down diarrhea.  Start with two tablets (4mg  total) first and then try one tablet every 6 hours.  Avoid if you are having fevers or severe pain.  If you are not better or start feeling worse, stop all medicines and call your doctor for advice o Call your doctor if you are getting worse or not better.  Sometimes further testing (cultures, endoscopy, X-ray studies, bloodwork, etc) may be needed to help diagnose and treat the cause of the diarrhea.  Inguinal Hernia, Adult Muscles help keep everything in the body in its proper place. But if a weak spot in the muscles develops, something can poke through. That is called a hernia. When this happens in the lower part of the belly (abdomen), it is called an inguinal hernia. (It takes its name from a part of the body in this region called the inguinal canal.) A weak spot in the wall of muscles lets some fat or part of the small intestine bulge through. An inguinal hernia can develop at any age. Men get them more often than women. CAUSES  In adults, an inguinal hernia develops over time.  It can be triggered by:  Suddenly straining the muscles of the lower abdomen.  Lifting heavy objects.  Straining to have a bowel movement. Difficult bowel movements (constipation) can lead to this.  Constant coughing. This may be caused by smoking or lung disease.  Being overweight.  Being pregnant.  Working at a job that requires long periods of standing or heavy lifting.  Having had an inguinal hernia before. One type can be an emergency situation. It is called a strangulated inguinal hernia. It develops if part of the small intestine slips through the weak spot and cannot get back into the abdomen. The blood supply can be cut off.  If that happens, part of the intestine may die. This situation requires  emergency surgery. SYMPTOMS  Often, a small inguinal hernia has no symptoms. It is found when a healthcare provider does a physical exam. Larger hernias usually have symptoms.   In adults, symptoms may include:  A lump in the groin. This is easier to see when the person is standing. It might disappear when lying down.  In men, a lump in the scrotum.  Pain or burning in the groin. This occurs especially when lifting, straining or coughing.  A dull ache or feeling of pressure in the groin.  Signs of a strangulated hernia can include:  A bulge in the groin that becomes very painful and tender to the touch.  A bulge that turns red or purple.  Fever, nausea and vomiting.  Inability to have a bowel movement or to pass gas. DIAGNOSIS  To decide if you have an inguinal hernia, a healthcare provider will probably do a physical examination.  This will include asking questions about any symptoms you have noticed.  The healthcare provider might feel the groin area and ask you to cough. If an inguinal hernia is felt, the healthcare provider may try to slide it back into the abdomen.  Usually no other tests are needed. TREATMENT  Treatments can vary. The size of the hernia makes a difference. Options include:  Watchful waiting. This is often suggested if the hernia is small and you have had no symptoms.  No medical procedure will be done unless symptoms develop.  You will need to watch closely for symptoms. If any occur, contact your healthcare provider right away.  Surgery. This is used if the hernia is larger or you have symptoms.  Open surgery. This is usually an outpatient procedure (you will not stay overnight in a hospital). An cut (incision) is made through the skin in the groin. The hernia is put back inside the abdomen. The weak area in the muscles is then repaired by herniorrhaphy or hernioplasty. Herniorrhaphy: in this type of surgery, the weak muscles are sewn back together.  Hernioplasty: a patch or mesh is used to close the weak area in the abdominal wall.  Laparoscopy. In this procedure, a surgeon makes small incisions. A thin tube with a tiny video camera (called a laparoscope) is put into the abdomen. The surgeon repairs the hernia with mesh by looking with the video camera and using two long instruments. HOME CARE INSTRUCTIONS   After surgery to repair an inguinal hernia:  You will need to take pain medicine prescribed by your healthcare provider. Follow all directions carefully.  You will need to take care of the wound from the incision.  Your activity will be restricted for awhile. This will probably include no heavy lifting for several weeks. You also should not do anything too active for a few weeks. When you can return to work will depend on the type of job that you have.  During "watchful waiting" periods, you should:  Maintain a healthy weight.  Eat a diet high in fiber (fruits, vegetables and whole grains).  Drink plenty of fluids to avoid constipation. This means drinking enough water and other liquids to keep your urine clear or pale yellow.  Do not lift heavy objects.  Do not stand for long periods of time.  Quit smoking. This should keep you from developing a frequent cough. SEEK MEDICAL CARE IF:   A bulge develops in your groin area.  You feel pain, a  burning sensation or pressure in the groin. This might be worse if you are lifting or straining.  You develop a fever of more than 100.5 F (38.1 C). SEEK IMMEDIATE MEDICAL CARE IF:   Pain in the groin increases suddenly.  A bulge in the groin gets bigger suddenly and does not go down.  For men, there is sudden pain in the scrotum. Or, the size of the scrotum increases.  A bulge in the groin area becomes red or purple and is painful to touch.  You have nausea or vomiting that does not go away.  You feel your heart beating much faster than normal.  You cannot have a bowel  movement or pass gas.  You develop a fever of more than 102.0 F (38.9 C). Document Released: 09/07/2008 Document Revised: 07/14/2011 Document Reviewed: 09/07/2008 [facility] Patient Information 2015 [place], [place]. This information is not intended to replace advice given to you by your health care provider. Make sure you discuss any questions you have with your health care provider.

## 2014-05-25 NOTE — H&P (View-Only) (Signed)
Tracy Turner 04/26/2014 10:08 AM Location: [facility] Patient #: [id number] DOB: 09-11-1955 Married / Language: Cleophus Molt / Race: White Male History of Present Illness [name] MD; 04/26/2014 11:08 AM) Patient words: eval hernia.  The patient is a 59 year old male who presents with an inguinal hernia. Patient sent to me for surgical consultation by his primary care physician, Dr. Bluford Kaufmann, for concern of inguinal hernia. Pleasant and active male. 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 He noted some groin pulling and burning about a year ago. No definite lump. However obvious hernia noted a few months ago. It is becoming more sore and larger of these past few months. Discussed with his primary care physician. Surgical consultation recommended. He has no history of prior surgeries. Usually has bowel movement every day. No longer smokes. Very active. Can walk several miles without difficulty. No history of cardiopulmonary events. Urinates once a nice but no major prostatism issues Other Problems ([name], LPN; 54/62/7035 00:93 AM) Bladder Problems Gastroesophageal Reflux Disease Hypercholesterolemia Inguinal Hernia  Past Surgical History ([name], LPN; 81/82/9937 16:96 AM) No pertinent past surgical history  Diagnostic Studies History ([name], LPN; 78/93/8101 75:10 AM) Colonoscopy 5-10 years ago  Allergies ([name], LPN; 25/85/2778 24:23 AM) No Known Drug Allergies 04/26/2014  Medication History ([name], LPN; 53/61/4431 54:00 AM) Levothyroxine Sodium (137MCG Tablet, Oral) Active. Pantoprazole Sodium (40MG  Tablet DR, Oral) Active. MetFORMIN HCl (500MG  Tablet, Oral) Active. Simvastatin (40MG  Tablet, Oral) Active. Aspirin EC (81MG  Tablet DR, Oral) Active. Avanafil (100MG  Tablet, Oral) Active. Glucosamine Chondr 1500 Complx (Oral) Active. Multivitamin Adults 50+ (Oral)  Active.  Social History ([name], LPN; 86/76/1950 93:26 AM) Alcohol use Occasional alcohol use. Caffeine use Tea. No drug use Tobacco use Current every day smoker.  Family History Aleatha Borer, LPN; 71/24/5809 98:33 AM) Arthritis Mother. Cancer Mother. Respiratory Condition Mother.     Review of Systems ([name] LPN; 82/50/5397 67:34 AM) General Not Present- Appetite Loss, Chills, Fatigue, Fever, Night Sweats, Weight Gain and Weight Loss. Skin Not Present- Change in Wart/Mole, Dryness, Hives, Jaundice, New Lesions, Non-Healing Wounds, Rash and Ulcer. HEENT Not Present- Earache, Hearing Loss, Hoarseness, Nose Bleed, Oral Ulcers, Ringing in the Ears, Seasonal Allergies, Sinus Pain, Sore Throat, Visual Disturbances, Wears glasses/contact lenses and Yellow Eyes. Respiratory Not Present- Bloody sputum, Chronic Cough, Difficulty Breathing, Snoring and Wheezing. Breast Not Present- Breast Mass, Breast Pain, Nipple Discharge and Skin Changes. Cardiovascular Not Present- Chest Pain, Difficulty Breathing Lying Down, Leg Cramps, Palpitations, Rapid Heart Rate, Shortness of Breath and Swelling of Extremities. Gastrointestinal Not Present- Abdominal Pain, Bloating, Bloody Stool, Change in Bowel Habits, Chronic diarrhea, Constipation, Difficulty Swallowing, Excessive gas, Gets full quickly at meals, Hemorrhoids, Indigestion, Nausea, Rectal Pain and Vomiting. Male Genitourinary Present- Frequency, Nocturia and Urgency. Not Present- Blood in Urine, Change in Urinary Stream, Impotence, Painful Urination and Urine Leakage. Musculoskeletal Not Present- Back Pain, Joint Pain, Joint Stiffness, Muscle Pain, Muscle Weakness and Swelling of Extremities. Neurological Not Present- Decreased Memory, Fainting, Headaches, Numbness, Seizures, Tingling, Tremor, Trouble walking and Weakness. Psychiatric Not Present- Anxiety, Bipolar, Change in Sleep Pattern, Depression, Fearful and Frequent  crying. Endocrine Not Present- Cold Intolerance, Excessive Hunger, Hair Changes, Heat Intolerance, Hot flashes and New Diabetes. Hematology Not Present- Easy Bruising, Excessive bleeding, Gland problems, HIV and Persistent Infections.  Vitals ([name] LPN; 19/37/9024 09:73 AM) 04/26/2014 10:10 AM Weight: 202 lb Height: 71.5in Body Surface Area: 2.15 m Body Mass Index: 27.78 kg/m Temp.: 98.64F(Oral)  Pulse: 83 (Regular)  Resp.: 16 (Unlabored)  BP: 108/70 (Sitting, Left Arm, Standard)     Physical Exam [name] MD; 04/26/2014 11:05 AM)  General Mental Status-Alert. General Appearance-Not in acute distress, Not Sickly. Orientation-Oriented X3. Hydration-Well hydrated. Voice-Normal.  Integumentary Global Assessment Upon inspection and palpation of skin surfaces of the - Axillae: non-tender, no inflammation or ulceration, no drainage. and Distribution of scalp and body hair is normal. General Characteristics Temperature - normal warmth is noted.  Head and Neck Head-normocephalic, atraumatic with no lesions or palpable masses. Face Global Assessment - atraumatic, no absence of expression. Neck Global Assessment - no abnormal movements, no bruit auscultated on the right, no bruit auscultated on the left, no decreased range of motion, non-tender. Trachea-midline. Thyroid Gland Characteristics - non-tender.  Eye Eyeball - Left-Extraocular movements intact, No Nystagmus. Eyeball - Right-Extraocular movements intact, No Nystagmus. Cornea - Left-No Hazy. Cornea - Right-No Hazy. Sclera/Conjunctiva - Left-No scleral icterus, No Discharge. Sclera/Conjunctiva - Right-No scleral icterus, No Discharge. Pupil - Left-Direct reaction to light normal. Pupil - Right-Direct reaction to light normal.  ENMT Ears Pinna - Left - no drainage observed, no generalized tenderness observed. Right - no drainage observed, no generalized  tenderness observed. Nose and Sinuses External Inspection of the Nose - no destructive lesion observed. Inspection of the nares - Left - quiet respiration. Right - quiet respiration. Mouth and Throat Lips - Upper Lip - no fissures observed, no pallor noted. Lower Lip - no fissures observed, no pallor noted. Nasopharynx - no discharge present. Oral Cavity/Oropharynx - Tongue - no dryness observed. Oral Mucosa - no cyanosis observed. Hypopharynx - no evidence of airway distress observed.  Chest and Lung Exam Inspection Movements - Normal and Symmetrical. Accessory muscles - No use of accessory muscles in breathing. Palpation Palpation of the chest reveals - Non-tender. Auscultation Breath sounds - Normal and Clear.  Cardiovascular Auscultation Rhythm - Regular. Murmurs & Other Heart Sounds - Auscultation of the heart reveals - No Murmurs and No Systolic Clicks.  Abdomen Inspection Inspection of the abdomen reveals - No Visible peristalsis and No Abnormal pulsations. Umbilicus - No Bleeding, No Urine drainage. Palpation/Percussion Palpation and Percussion of the abdomen reveal - Soft, Non Tender, No Rebound tenderness, No Rigidity (guarding) and No Cutaneous hyperesthesia. Note: Abdomen soft and flat. No umbilical hernias. No diastases.   Male Genitourinary Sexual Maturity Tanner 5 - Adult hair pattern and Adult penile size and shape. Note: Normal external male genitalia. Circumcised. Testes epididymides and cords normal.  Obvious right groin bulge reducible consistent with right inguinal hernia. No definite left inguinal hernia.   Peripheral Vascular Upper Extremity Inspection - Left - No Cyanotic nailbeds, Not Ischemic. Right - No Cyanotic nailbeds, Not Ischemic.  Neurologic Neurologic evaluation reveals -normal attention span and ability to concentrate, able to name objects and repeat phrases. Appropriate fund of knowledge , normal sensation and normal coordination. Mental  Status Affect - not angry, not paranoid. Cranial Nerves-Normal Bilaterally. Gait-Normal.  Neuropsychiatric Mental status exam performed with findings of-able to articulate well with normal speech/language, rate, volume and coherence, thought content normal with ability to perform basic computations and apply abstract reasoning and no evidence of hallucinations, delusions, obsessions or homicidal/suicidal ideation.  Musculoskeletal Global Assessment Spine, Ribs and Pelvis - no instability, subluxation or laxity. Right Upper Extremity - no instability, subluxation or laxity.  Lymphatic Head & Neck  General Head & Neck Lymphatics: Bilateral - Description - No Localized lymphadenopathy. Axillary  General Axillary Region: Bilateral -  Description - No Localized lymphadenopathy. Femoral & Inguinal  Generalized Femoral & Inguinal Lymphatics: Left - Description - No Localized lymphadenopathy. Right - Description - No Localized lymphadenopathy.    Assessment & Plan [name] MD; 04/26/2014 11:07 AM)  REDUCIBLE RIGHT INGUINAL HERNIA (550.90  K40.90) Impression: I think he would benefit from surgery to repair this. An is increased in size and become more symptomatic. 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 I discussed with him. He is interested in proceeding. Plan laparoscopic approach to make sure there is no contralateral hernia. Probably not likely but we will see:  The anatomy & physiology of the abdominal wall and pelvic floor was discussed. The pathophysiology of hernias in the inguinal and pelvic region was discussed. Natural history risks such as progressive enlargement, pain, incarceration & strangulation was discussed. Contributors to complications such as smoking, obesity, diabetes, prior surgery, etc were discussed.  I feel the risks of no intervention will lead to serious problems that outweigh the operative risks;  therefore, I recommended surgery to reduce and repair the hernia. I explained laparoscopic techniques with possible need for an open approach. I noted usual use of mesh to patch and/or buttress hernia repair  Risks such as bleeding, infection, abscess, need for further treatment, heart attack, death, and other risks were discussed. I noted a good likelihood this will help address the problem. Goals of post-operative recovery were discussed as well. Possibility that this will not correct all symptoms was explained. I stressed the importance of low-impact activity, aggressive pain control, avoiding constipation, & not pushing through pain to minimize risk of post-operative chronic pain or injury. Possibility of reherniation was discussed. We will work to minimize complications.  An educational handout further explaining the pathology & treatment options was given as well. Questions were answered. The patient expresses understanding & wishes to proceed with surgery.  Current Plans Schedule for Surgery Pt Education - [facility] Hernia Post-Op HCI (Christorpher Hisaw): discussed with patient and provided information. Pt Education - [facility] Pain Control (Levonte Molina) Pt Education - [place] (Orel Hord)

## 2014-05-29 ENCOUNTER — Encounter (HOSPITAL_COMMUNITY): Payer: Self-pay | Admitting: Surgery

## 2014-06-14 ENCOUNTER — Encounter: Payer: Self-pay | Admitting: Internal Medicine

## 2014-06-14 ENCOUNTER — Ambulatory Visit (INDEPENDENT_AMBULATORY_CARE_PROVIDER_SITE_OTHER): Payer: 59 | Admitting: Internal Medicine

## 2014-06-14 VITALS — BP 120/80 | HR 83 | Temp 98.2°F | Resp 20 | Ht 71.5 in | Wt 206.0 lb

## 2014-06-14 DIAGNOSIS — E119 Type 2 diabetes mellitus without complications: Secondary | ICD-10-CM

## 2014-06-14 DIAGNOSIS — E039 Hypothyroidism, unspecified: Secondary | ICD-10-CM

## 2014-06-14 LAB — HEMOGLOBIN A1C: Hgb A1c MFr Bld: 6.6 % — ABNORMAL HIGH (ref 4.6–6.5)

## 2014-06-14 NOTE — Patient Instructions (Signed)
It is important that you exercise regularly, at least 20 minutes 3 to 4 times per week.  If you develop chest pain or shortness of breath seek  medical attention.  Limit your sodium (Salt) intake  Return in 6 months for follow-up  

## 2014-06-14 NOTE — Progress Notes (Signed)
   Subjective:    Patient ID: Tracy Turner, male    DOB: August 20, 1955, 59 y.o.   MRN: [id number]  HPI   Wt Readings from Last 3 Encounters:  06/14/14 206 lb (93.441 kg)  05/25/14 207 lb (93.895 kg)  01/19/14 199 lb (90.266 kg)   Review of Systems     Objective:   Physical Exam        Assessment & Plan:

## 2014-06-14 NOTE — Progress Notes (Signed)
Subjective:    Patient ID: Tracy Turner, male    DOB: 09-25-1955, 59 y.o.   MRN: [id number]  HPI  59 year old patient who is seen today in follow-up.  He has had a recent hernia repair and has done quite well.  He has type 2 diabetes without complications, which has been very well controlled on metformin therapy.  His last hemoglobin A1c 6.5.  No concerns or complaints.  Past Medical History  Diagnosis Date  . Hyperlipidemia   . Hypothyroidism   . Tobacco abuse   . History of colonic polyps   . GERD (gastroesophageal reflux disease)   . Diabetes mellitus without complication     more controlled A1C after 30 # weight lost  . COPD (chronic obstructive pulmonary disease)     mild    History   Social History  . Marital Status: Married    Spouse Name: N/A  . Number of Children: N/A  . Years of Education: N/A   Occupational History  . Not on file.   Social History Main Topics  . Smoking status: Current Every Day Smoker -- 0.50 packs/day    Types: Cigarettes    Last Attempt to Quit: 08/30/2012  . Smokeless tobacco: Never Used     Comment: using Chantix...Marland Kitchen40 years on and off; 05-22-14 now smoking again < 1/2 ppd  . Alcohol Use: Yes     Comment: occ. maybe every month   . Drug Use: No  . Sexual Activity: Yes   Other Topics Concern  . Not on file   Social History Narrative    Past Surgical History  Procedure Laterality Date  . Colonoscopy  2008  . Ett  2007  . Tooth extraction      several -molars  . Inguinal hernia repair Bilateral 05/25/2014    Procedure: BILATERAL LAPAROSCOPIC INGUINAL HERNIA WITH MESH;  Surgeon: Michael Boston, MD;  Location: [facility];  Service: General;  Laterality: Bilateral;  . Insertion of mesh Bilateral 05/25/2014    Procedure: INSERTION OF MESH;  Surgeon: Michael Boston, MD;  Location: [facility];  Service: General;  Laterality: Bilateral;    Family History  Problem Relation Age of Onset  . Heart disease Father   . Healthy Sister   . Healthy  Brother   . Colon cancer Neg Hx     No Known Allergies  Current Outpatient Prescriptions on File Prior to Visit  Medication Sig Dispense Refill  . aspirin EC 81 MG tablet Take 81 mg by mouth daily.    . Avanafil 100 MG TABS Take 1 tablet by mouth as needed. (Patient taking differently: Take 1 tablet by mouth daily as needed (Erictile Dysfunction). ) 12 tablet 6  . Glucosamine-Chondroit-Vit C-Mn (GLUCOSAMINE CHONDR 1500 COMPLX) CAPS Take 2 capsules by mouth daily.    Marland Kitchen levothyroxine (SYNTHROID, LEVOTHROID) 137 MCG tablet TAKE ONE TABLET BY MOUTH EVERY DAY 90 tablet 3  . metFORMIN (GLUCOPHAGE) 500 MG tablet TAKE ONE TABLET BY MOUTH TWICE DAILY WITH MEALS 180 tablet 1  . Multiple Vitamins-Minerals (MENS 50+ MULTI VITAMIN/MIN PO) Take 1 tablet by mouth daily.    . pantoprazole (PROTONIX) 40 MG tablet Take 1 tablet (40 mg total) by mouth daily. 60 tablet 5  . simvastatin (ZOCOR) 40 MG tablet TAKE ONE TABLET BY MOUTH IN THE EVENING 90 tablet 3   No current facility-administered medications on file prior to visit.    BP 120/80 mmHg  Pulse 83  Temp(Src) 98.2 F (36.8 C) (Oral)  Resp  20  Ht 5' 11.5" (1.816 m)  Wt 206 lb (93.441 kg)  BMI 28.33 kg/m2  SpO2 97%     Review of Systems  Constitutional: Negative for fever, chills, appetite change and fatigue.  HENT: Negative for congestion, dental problem, ear pain, hearing loss, sore throat, tinnitus, trouble swallowing and voice change.   Eyes: Negative for pain, discharge and visual disturbance.  Respiratory: Negative for cough, chest tightness, wheezing and stridor.   Cardiovascular: Negative for chest pain, palpitations and leg swelling.  Gastrointestinal: Negative for nausea, vomiting, abdominal pain, diarrhea, constipation, blood in stool and abdominal distention.  Genitourinary: Negative for urgency, hematuria, flank pain, discharge, difficulty urinating and genital sores.  Musculoskeletal: Negative for myalgias, back pain, joint  swelling, arthralgias, gait problem and neck stiffness.  Skin: Negative for rash.  Neurological: Negative for dizziness, syncope, speech difficulty, weakness, numbness and headaches.  Hematological: Negative for adenopathy. Does not bruise/bleed easily.  Psychiatric/Behavioral: Negative for behavioral problems and dysphoric mood. The patient is not nervous/anxious.        Objective:   Physical Exam  Constitutional: He is oriented to person, place, and time. He appears well-developed.  HENT:  Head: Normocephalic.  Right Ear: External ear normal.  Left Ear: External ear normal.  Eyes: Conjunctivae and EOM are normal.  Neck: Normal range of motion.  Cardiovascular: Normal rate and normal heart sounds.   Pulmonary/Chest: Breath sounds normal.  Abdominal: Bowel sounds are normal.  Musculoskeletal: Normal range of motion. He exhibits no edema or tenderness.  Neurological: He is alert and oriented to person, place, and time.  Psychiatric: He has a normal mood and affect. His behavior is normal.          Assessment & Plan:   Diabetes mellitus.  Well-controlled.  We'll check a hemoglobin A1c Dyslipidemia.  Continue statin therapy Testosterone deficiency Hypothyroidism  No change in medical regimen CPX 6 months at a low

## 2014-06-14 NOTE — Progress Notes (Deleted)
Pre visit review using our clinic review tool, if applicable. No additional management support is needed unless otherwise documented below in the visit note. 

## 2014-06-15 ENCOUNTER — Telehealth: Payer: Self-pay | Admitting: Internal Medicine

## 2014-06-15 NOTE — Telephone Encounter (Signed)
emmi emailed °

## 2014-08-15 ENCOUNTER — Other Ambulatory Visit: Payer: Self-pay | Admitting: Internal Medicine

## 2014-09-04 ENCOUNTER — Telehealth: Payer: Self-pay | Admitting: Internal Medicine

## 2014-09-04 NOTE — Telephone Encounter (Signed)
Pt notified Rx was sent to pharmacy. 

## 2014-09-04 NOTE — Telephone Encounter (Signed)
Patient's wife called stating patient is completely out of the requested medication below.

## 2014-09-29 ENCOUNTER — Other Ambulatory Visit: Payer: Self-pay | Admitting: Internal Medicine

## 2014-10-08 ENCOUNTER — Telehealth: Payer: Self-pay | Admitting: Internal Medicine

## 2014-10-09 NOTE — Telephone Encounter (Signed)
Spoke to pt, told him Rx was sent to pharmacy but you are due for physical. I gave you enough for 3 months to get in for a physical. Pt verbalized understanding.

## 2014-10-09 NOTE — Telephone Encounter (Signed)
Patient came in about a RX refill for the synthroid 137 mcg . Please give patient a call when the RX is ready.

## 2014-11-15 ENCOUNTER — Other Ambulatory Visit: Payer: Self-pay | Admitting: Internal Medicine

## 2014-11-20 ENCOUNTER — Other Ambulatory Visit: Payer: 59

## 2014-11-23 ENCOUNTER — Other Ambulatory Visit (INDEPENDENT_AMBULATORY_CARE_PROVIDER_SITE_OTHER): Payer: 59

## 2014-11-23 DIAGNOSIS — Z0000 Encounter for general adult medical examination without abnormal findings: Secondary | ICD-10-CM

## 2014-11-23 LAB — CBC WITH DIFFERENTIAL/PLATELET
BASOS PCT: 0.4 % (ref 0.0–3.0)
Basophils Absolute: 0 10*3/uL (ref 0.0–0.1)
EOS PCT: 2.3 % (ref 0.0–5.0)
Eosinophils Absolute: 0.2 10*3/uL (ref 0.0–0.7)
HCT: 46.7 % (ref 39.0–52.0)
Hemoglobin: 15.7 g/dL (ref 13.0–17.0)
LYMPHS ABS: 1.3 10*3/uL (ref 0.7–4.0)
Lymphocytes Relative: 13 % (ref 12.0–46.0)
MCHC: 33.6 g/dL (ref 30.0–36.0)
MCV: 92.8 fl (ref 78.0–100.0)
Monocytes Absolute: 0.5 10*3/uL (ref 0.1–1.0)
Monocytes Relative: 5.3 % (ref 3.0–12.0)
NEUTROS PCT: 79 % — AB (ref 43.0–77.0)
Neutro Abs: 7.9 10*3/uL — ABNORMAL HIGH (ref 1.4–7.7)
Platelets: 241 10*3/uL (ref 150.0–400.0)
RBC: 5.03 Mil/uL (ref 4.22–5.81)
RDW: 13.2 % (ref 11.5–15.5)
WBC: 10.1 10*3/uL (ref 4.0–10.5)

## 2014-11-23 LAB — PSA: PSA: 0.79 ng/mL (ref 0.10–4.00)

## 2014-11-23 LAB — BASIC METABOLIC PANEL
BUN: 17 mg/dL (ref 6–23)
CO2: 27 mEq/L (ref 19–32)
CREATININE: 1.02 mg/dL (ref 0.40–1.50)
Calcium: 9.6 mg/dL (ref 8.4–10.5)
Chloride: 105 mEq/L (ref 96–112)
GFR: 79.58 mL/min (ref >60.00)
Glucose, Bld: 102 mg/dL — ABNORMAL HIGH (ref 70–99)
Potassium: 4.4 mEq/L (ref 3.5–5.1)
Sodium: 139 mEq/L (ref 135–145)

## 2014-11-23 LAB — LIPID PANEL
Cholesterol: 161 mg/dL (ref 0–200)
HDL: 36 mg/dL — ABNORMAL LOW (ref >39.00)
LDL Cholesterol: 96 mg/dL (ref 0–99)
NONHDL: 125
Total CHOL/HDL Ratio: 4
Triglycerides: 144 mg/dL (ref 0.0–149.0)
VLDL: 28.8 mg/dL (ref 0.0–40.0)

## 2014-11-23 LAB — HEMOGLOBIN A1C: Hgb A1c MFr Bld: 6.3 % (ref 4.6–6.5)

## 2014-11-23 LAB — HEPATIC FUNCTION PANEL
ALBUMIN: 4.3 g/dL (ref 3.5–5.2)
ALK PHOS: 46 U/L (ref 39–117)
ALT: 18 U/L (ref 0–53)
AST: 13 U/L (ref 0–37)
Bilirubin, Direct: 0.1 mg/dL (ref 0.0–0.3)
Total Bilirubin: 0.6 mg/dL (ref 0.2–1.2)
Total Protein: 6.5 g/dL (ref 6.0–8.3)

## 2014-11-23 LAB — MICROALBUMIN / CREATININE URINE RATIO
CREATININE, U: 194.5 mg/dL
MICROALB/CREAT RATIO: 0.4 mg/g (ref 0.0–30.0)

## 2014-11-23 LAB — POCT URINALYSIS DIPSTICK
Bilirubin, UA: NEGATIVE
Blood, UA: NEGATIVE
Glucose, UA: NEGATIVE
Ketones, UA: NEGATIVE
LEUKOCYTES UA: NEGATIVE
Nitrite, UA: NEGATIVE
PROTEIN UA: NEGATIVE
SPEC GRAV UA: 1.025
UROBILINOGEN UA: 0.2
pH, UA: 5.5

## 2014-11-23 LAB — TSH: TSH: 1.44 u[IU]/mL (ref 0.35–4.50)

## 2014-11-24 ENCOUNTER — Ambulatory Visit (INDEPENDENT_AMBULATORY_CARE_PROVIDER_SITE_OTHER): Payer: 59 | Admitting: Internal Medicine

## 2014-11-24 ENCOUNTER — Encounter: Payer: Self-pay | Admitting: Internal Medicine

## 2014-11-24 VITALS — BP 120/88 | HR 84 | Temp 98.4°F | Ht 72.0 in | Wt 198.0 lb

## 2014-11-24 DIAGNOSIS — Z0000 Encounter for general adult medical examination without abnormal findings: Secondary | ICD-10-CM | POA: Diagnosis not present

## 2014-11-24 MED ORDER — VARENICLINE TARTRATE 1 MG PO TABS: 1.0000 mg | ORAL_TABLET | Freq: Two times a day (BID) | ORAL | Status: DC

## 2014-11-24 MED ORDER — VARENICLINE TARTRATE 0.5 MG X 11 & 1 MG X 42 PO MISC: ORAL | Status: DC

## 2014-11-24 NOTE — Patient Instructions (Signed)
It is important that you exercise regularly, at least 20 minutes 3 to 4 times per week.  If you develop chest pain or shortness of breath seek  medical attention.   Please check your hemoglobin A1c every 6 months   Health Maintenance A healthy lifestyle and preventative care can promote health and wellness.  Maintain regular health, dental, and eye exams.  Eat a healthy diet. Foods like vegetables, fruits, whole grains, low-fat dairy products, and lean protein foods contain the nutrients you need and are low in calories. Decrease your intake of foods high in solid fats, added sugars, and salt. Get information about a proper diet from your health care provider, if necessary.  Regular physical exercise is one of the most important things you can do for your health. Most adults should get at least 150 minutes of moderate-intensity exercise (any activity that increases your heart rate and causes you to sweat) each week. In addition, most adults need muscle-strengthening exercises on 2 or more days a week.   Maintain a healthy weight. The body mass index (BMI) is a screening tool to identify possible weight problems. It provides an estimate of body fat based on height and weight. Your health care provider can find your BMI and can help you achieve or maintain a healthy weight. For males 20 years and older:  A BMI below 18.5 is considered underweight.  A BMI of 18.5 to 24.9 is normal.  A BMI of 25 to 29.9 is considered overweight.  A BMI of 30 and above is considered obese.  Maintain normal blood lipids and cholesterol by exercising and minimizing your intake of saturated fat. Eat a balanced diet with plenty of fruits and vegetables. Blood tests for lipids and cholesterol should begin at age 74 and be repeated every 5 years. If your lipid or cholesterol levels are high, you are over age 74, or you are at high risk for heart disease, you may need your cholesterol levels checked more  frequently.Ongoing high lipid and cholesterol levels should be treated with medicines if diet and exercise are not working.  If you smoke, find out from your health care provider how to quit. If you do not use tobacco, do not start.  Lung cancer screening is recommended for adults aged 75-80 years who are at high risk for developing lung cancer because of a history of smoking. A yearly low-dose CT scan of the lungs is recommended for people who have at least a 30-pack-year history of smoking and are current smokers or have quit within the past 15 years. A pack year of smoking is smoking an average of 1 pack of cigarettes a day for 1 year (for example, a 30-pack-year history of smoking could mean smoking 1 pack a day for 30 years or 2 packs a day for 15 years). Yearly screening should continue until the smoker has stopped smoking for at least 15 years. Yearly screening should be stopped for people who develop a health problem that would prevent them from having lung cancer treatment.  If you choose to drink alcohol, do not have more than 2 drinks per day. One drink is considered to be 12 oz (360 mL) of beer, 5 oz (150 mL) of wine, or 1.5 oz (45 mL) of liquor.  Avoid the use of street drugs. Do not share needles with anyone. Ask for help if you need support or instructions about stopping the use of drugs.  High blood pressure causes heart disease and increases the  risk of stroke. Blood pressure should be checked at least every 1-2 years. Ongoing high blood pressure should be treated with medicines if weight loss and exercise are not effective.  If you are 4-34 years old, ask your health care provider if you should take aspirin to prevent heart disease.  Diabetes screening involves taking a blood sample to check your fasting blood sugar level. This should be done once every 3 years after age 69 if you are at a normal weight and without risk factors for diabetes. Testing should be considered at a younger  age or be carried out more frequently if you are overweight and have at least 1 risk factor for diabetes.  Colorectal cancer can be detected and often prevented. Most routine colorectal cancer screening begins at the age of 76 and continues through age 65. However, your health care provider may recommend screening at an earlier age if you have risk factors for colon cancer. On a yearly basis, your health care provider may provide home test kits to check for hidden blood in the stool. A small camera at the end of a tube may be used to directly examine the colon (sigmoidoscopy or colonoscopy) to detect the earliest forms of colorectal cancer. Talk to your health care provider about this at age 17 when routine screening begins. A direct exam of the colon should be repeated every 5-10 years through age 62, unless early forms of precancerous polyps or small growths are found.  People who are at an increased risk for hepatitis B should be screened for this virus. You are considered at high risk for hepatitis B if:  You were born in a country where hepatitis B occurs often. Talk with your health care provider about which countries are considered high risk.  Your parents were born in a high-risk country and you have not received a shot to protect against hepatitis B (hepatitis B vaccine).  You have HIV or AIDS.  You use needles to inject street drugs.  You live with, or have sex with, someone who has hepatitis B.  You are a man who has sex with other men (MSM).  You get hemodialysis treatment.  You take certain medicines for conditions like cancer, organ transplantation, and autoimmune conditions.  Hepatitis C blood testing is recommended for all people born from [birth year] through 1964 and any individual with known risk factors for hepatitis C.  Healthy men should no longer receive prostate-specific antigen (PSA) blood tests as part of routine cancer screening. Talk to your health care provider about  prostate cancer screening.  Testicular cancer screening is not recommended for adolescents or adult males who have no symptoms. Screening includes self-exam, a health care provider exam, and other screening tests. Consult with your health care provider about any symptoms you have or any concerns you have about testicular cancer.  Practice safe sex. Use condoms and avoid high-risk sexual practices to reduce the spread of sexually transmitted infections (STIs).  You should be screened for STIs, including gonorrhea and chlamydia if:  You are sexually active and are younger than 24 years.  You are older than 24 years, and your health care provider tells you that you are at risk for this type of infection.  Your sexual activity has changed since you were last screened, and you are at an increased risk for chlamydia or gonorrhea. Ask your health care provider if you are at risk.  If you are at risk of being infected with HIV, it is  recommended that you take a prescription medicine daily to prevent HIV infection. This is called pre-exposure prophylaxis (PrEP). You are considered at risk if:  You are a man who has sex with other men (MSM).  You are a heterosexual man who is sexually active with multiple partners.  You take drugs by injection.  You are sexually active with a partner who has HIV.  Talk with your health care provider about whether you are at high risk of being infected with HIV. If you choose to begin PrEP, you should first be tested for HIV. You should then be tested every 3 months for as long as you are taking PrEP.  Use sunscreen. Apply sunscreen liberally and repeatedly throughout the day. You should seek shade when your shadow is shorter than you. Protect yourself by wearing long sleeves, pants, a wide-brimmed hat, and sunglasses year round whenever you are outdoors.  Tell your health care provider of new moles or changes in moles, especially if there is a change in shape or  color. Also, tell your health care provider if a mole is larger than the size of a pencil eraser.  A one-time screening for abdominal aortic aneurysm (AAA) and surgical repair of large AAAs by ultrasound is recommended for men aged 67-75 years who are current or former smokers.  Stay current with your vaccines (immunizations). Document Released: 10/18/2007 Document Revised: 04/26/2013 Document Reviewed: 09/16/2010 [facility] Patient Information 2015 [place], [place]. This information is not intended to replace advice given to you by your health care provider. Make sure you discuss any questions you have with your health care provider.

## 2014-11-24 NOTE — Progress Notes (Signed)
Subjective:    Patient ID: Tracy Turner, male    DOB: [birth year], 59 y.o.   MRN: [id number]  HPI  Wt Readings from Last 3 Encounters:  11/24/14 198 lb (89.812 kg)  06/14/14 206 lb (93.441 kg)  05/25/14 207 lb (93.95 kg)   59 year old patient seen today for a preventive health examination  Family history father living, status post recent stroke.  Mother has recently completed chemotherapy for lung cancer  Past Medical History  Diagnosis Date  . Hyperlipidemia   . Hypothyroidism   . Tobacco abuse   . History of colonic polyps   . GERD (gastroesophageal reflux disease)   . Diabetes mellitus without complication     more controlled A1C after 30 # weight lost  . COPD (chronic obstructive pulmonary disease)     mild    History   Social History  . Marital Status: Married    Spouse Name: N/A  . Number of Children: N/A  . Years of Education: N/A   Occupational History  . Not on file.   Social History Main Topics  . Smoking status: Current Every Day Smoker -- 0.50 packs/day    Types: Cigarettes    Last Attempt to Quit: 08/30/2012  . Smokeless tobacco: Never Used     Comment: using Chantix...Marland Kitchen40 years on and off; 05-22-14 now smoking again < 1/2 ppd  . Alcohol Use: Yes     Comment: occ. maybe every month   . Drug Use: No  . Sexual Activity: Yes   Other Topics Concern  . Not on file   Social History Narrative    Past Surgical History  Procedure Laterality Date  . Colonoscopy  2008  . Ett  2007  . Tooth extraction      several -molars  . Inguinal hernia repair Bilateral 05/25/2014    Procedure: BILATERAL LAPAROSCOPIC INGUINAL HERNIA WITH MESH;  Surgeon: Michael Boston, MD;  Location: [facility];  Service: General;  Laterality: Bilateral;  . Insertion of mesh Bilateral 05/25/2014    Procedure: INSERTION OF MESH;  Surgeon: Michael Boston, MD;  Location: [facility];  Service: General;  Laterality: Bilateral;    Family History  Problem Relation Age of Onset  . Heart disease  Father   . Healthy Sister   . Healthy Brother   . Colon cancer Neg Hx     No Known Allergies  Current Outpatient Prescriptions on File Prior to Visit  Medication Sig Dispense Refill  . aspirin EC 81 MG tablet Take 81 mg by mouth daily.    . Glucosamine-Chondroit-Vit C-Mn (GLUCOSAMINE CHONDR 1500 COMPLX) CAPS Take 2 capsules by mouth daily.    Marland Kitchen levothyroxine (SYNTHROID, LEVOTHROID) 137 MCG tablet TAKE ONE TABLET BY MOUTH ONCE DAILY 90 tablet 0  . metFORMIN (GLUCOPHAGE) 500 MG tablet TAKE ONE TABLET BY MOUTH TWICE DAILY WITH MEALS. MUST HAVE PHYSICAL BEFORE FURTHER REFILLS. 180 tablet 0  . Multiple Vitamins-Minerals (MENS 50+ MULTI VITAMIN/MIN PO) Take 1 tablet by mouth daily.    . simvastatin (ZOCOR) 40 MG tablet TAKE ONE TABLET BY MOUTH IN THE EVENING 90 tablet 1  . STENDRA 100 MG TABS TAKE ONE TABLET BY MOUTH AS NEEDED 12 tablet 1  . pantoprazole (PROTONIX) 40 MG tablet Take 1 tablet (40 mg total) by mouth daily. (Patient not taking: Reported on 11/24/2014) 60 tablet 5   No current facility-administered medications on file prior to visit.    BP 120/88 mmHg  Pulse 84  Temp(Src) 98.4 F (36.9 C) (Oral)  Ht 6' (1.829 m)  Wt 198 lb (89.812 kg)  BMI 26.85 kg/m2      Review of Systems  Constitutional: Negative for fever, chills, activity change, appetite change and fatigue.  HENT: Negative for congestion, dental problem, ear pain, hearing loss, mouth sores, rhinorrhea, sinus pressure, sneezing, tinnitus, trouble swallowing and voice change.   Eyes: Negative for photophobia, pain, redness and visual disturbance.  Respiratory: Negative for apnea, cough, choking, chest tightness, shortness of breath and wheezing.   Cardiovascular: Negative for chest pain, palpitations and leg swelling.  Gastrointestinal: Negative for nausea, vomiting, abdominal pain, diarrhea, constipation, blood in stool, abdominal distention, anal bleeding and rectal pain.  Genitourinary: Negative for dysuria,  urgency, frequency, hematuria, flank pain, decreased urine volume, discharge, penile swelling, scrotal swelling, difficulty urinating, genital sores and testicular pain.  Musculoskeletal: Negative for myalgias, back pain, joint swelling, arthralgias, gait problem, neck pain and neck stiffness.  Skin: Negative for color change, rash and wound.  Neurological: Negative for dizziness, tremors, seizures, syncope, facial asymmetry, speech difficulty, weakness, light-headedness, numbness and headaches.  Hematological: Negative for adenopathy. Does not bruise/bleed easily.  Psychiatric/Behavioral: Negative for suicidal ideas, hallucinations, behavioral problems, confusion, sleep disturbance, self-injury, dysphoric mood, decreased concentration and agitation. The patient is not nervous/anxious.        Objective:   Physical Exam  Constitutional: He appears well-developed and well-nourished.  HENT:  Head: Normocephalic and atraumatic.  Right Ear: External ear normal.  Left Ear: External ear normal.  Nose: Nose normal.  Mouth/Throat: Oropharynx is clear and moist.  Eyes: Conjunctivae and EOM are normal. Pupils are equal, round, and reactive to light. No scleral icterus.  Neck: Normal range of motion. Neck supple. No JVD present. No thyromegaly present.  Cardiovascular: Regular rhythm, normal heart sounds and intact distal pulses.  Exam reveals no gallop and no friction rub.   No murmur heard. Slight decreased right dorsalis pedis pulse  Pulmonary/Chest: Effort normal and breath sounds normal. He exhibits no tenderness.  Abdominal: Soft. Bowel sounds are normal. He exhibits no distension and no mass. There is no tenderness.  Genitourinary: Prostate normal and penis normal.  Musculoskeletal: Normal range of motion. He exhibits no edema or tenderness.  Lymphadenopathy:    He has no cervical adenopathy.  Neurological: He is alert. He has normal reflexes. No cranial nerve deficit. Coordination normal.    Skin: Skin is warm and dry. No rash noted.  Psychiatric: He has a normal mood and affect. His behavior is normal.          Assessment & Plan:   Preventive health examination Diabetes mellitus.  Excellent control Hypothyroidism Mild dyslipidemia GERD.  Stable off therapy   No change in therapy Recheck 6 months

## 2014-11-24 NOTE — Progress Notes (Signed)
Pre visit review using our clinic review tool, if applicable. No additional management support is needed unless otherwise documented below in the visit note. Lab Results  Component Value Date   HGBA1C 6.3 11/23/2014   HGBA1C 6.6* 06/14/2014   HGBA1C 6.5 11/21/2013   Lab Results  Component Value Date   MICROALBUR <0.7 11/23/2014   LDLCALC 96 11/23/2014   CREATININE 1.02 11/23/2014

## 2014-11-26 ENCOUNTER — Other Ambulatory Visit: Payer: Self-pay | Admitting: Internal Medicine

## 2015-01-07 ENCOUNTER — Other Ambulatory Visit: Payer: Self-pay | Admitting: Internal Medicine

## 2015-01-28 ENCOUNTER — Other Ambulatory Visit: Payer: Self-pay | Admitting: Internal Medicine

## 2015-02-12 ENCOUNTER — Other Ambulatory Visit: Payer: Self-pay | Admitting: Internal Medicine

## 2015-03-04 ENCOUNTER — Other Ambulatory Visit: Payer: Self-pay | Admitting: Internal Medicine

## 2015-03-20 ENCOUNTER — Other Ambulatory Visit: Payer: Self-pay | Admitting: Internal Medicine

## 2015-04-04 ENCOUNTER — Other Ambulatory Visit: Payer: Self-pay | Admitting: Internal Medicine

## 2015-04-04 MED ORDER — SIMVASTATIN 40 MG PO TABS: 40.0000 mg | ORAL_TABLET | Freq: Every evening | ORAL | Status: DC

## 2015-04-04 MED ORDER — METFORMIN HCL 500 MG PO TABS: 500.0000 mg | ORAL_TABLET | Freq: Two times a day (BID) | ORAL | Status: DC

## 2015-04-04 MED ORDER — LEVOTHYROXINE SODIUM 137 MCG PO TABS: 137.0000 ug | ORAL_TABLET | Freq: Every day | ORAL | Status: DC

## 2015-04-04 MED ORDER — PANTOPRAZOLE SODIUM 40 MG PO TBEC: 40.0000 mg | DELAYED_RELEASE_TABLET | Freq: Every day | ORAL | Status: DC

## 2015-04-04 NOTE — Telephone Encounter (Signed)
Rx was sent  

## 2015-04-04 NOTE — Telephone Encounter (Signed)
Pt needs [facility]xs [facility] #180, levothyroxine, simvastatin,pantoprazole #90 w/refills .

## 2015-11-15 ENCOUNTER — Telehealth: Payer: Self-pay | Admitting: Internal Medicine

## 2015-11-15 NOTE — Telephone Encounter (Signed)
Noted  

## 2015-11-15 NOTE — Telephone Encounter (Signed)
FYI Pt would like for you to know that the insurance is requiring a prior auth.for Rx Stenda 100 mg.

## 2015-11-19 ENCOUNTER — Other Ambulatory Visit (INDEPENDENT_AMBULATORY_CARE_PROVIDER_SITE_OTHER): Payer: 59

## 2015-11-19 DIAGNOSIS — Z0000 Encounter for general adult medical examination without abnormal findings: Secondary | ICD-10-CM | POA: Diagnosis not present

## 2015-11-19 LAB — HEPATIC FUNCTION PANEL
ALBUMIN: 4.2 g/dL (ref 3.5–5.2)
ALK PHOS: 41 U/L (ref 39–117)
ALT: 20 U/L (ref 0–53)
AST: 14 U/L (ref 0–37)
BILIRUBIN DIRECT: 0.1 mg/dL (ref 0.0–0.3)
TOTAL PROTEIN: 6.6 g/dL (ref 6.0–8.3)
Total Bilirubin: 0.6 mg/dL (ref 0.2–1.2)

## 2015-11-19 LAB — CBC WITH DIFFERENTIAL/PLATELET
BASOS ABS: 0.1 10*3/uL (ref 0.0–0.1)
Basophils Relative: 0.9 % (ref 0.0–3.0)
EOS PCT: 6.2 % — AB (ref 0.0–5.0)
Eosinophils Absolute: 0.4 10*3/uL (ref 0.0–0.7)
HEMATOCRIT: 44.5 % (ref 39.0–52.0)
Hemoglobin: 14.8 g/dL (ref 13.0–17.0)
LYMPHS ABS: 1.2 10*3/uL (ref 0.7–4.0)
LYMPHS PCT: 19 % (ref 12.0–46.0)
MCHC: 33.3 g/dL (ref 30.0–36.0)
MCV: 91.4 fl (ref 78.0–100.0)
MONOS PCT: 7.9 % (ref 3.0–12.0)
Monocytes Absolute: 0.5 10*3/uL (ref 0.1–1.0)
NEUTROS ABS: 4.2 10*3/uL (ref 1.4–7.7)
NEUTROS PCT: 66 % (ref 43.0–77.0)
Platelets: 236 10*3/uL (ref 150.0–400.0)
RBC: 4.86 Mil/uL (ref 4.22–5.81)
RDW: 13.5 % (ref 11.5–15.5)
WBC: 6.4 10*3/uL (ref 4.0–10.5)

## 2015-11-19 LAB — BASIC METABOLIC PANEL
BUN: 16 mg/dL (ref 6–23)
CALCIUM: 9.6 mg/dL (ref 8.4–10.5)
CO2: 28 mEq/L (ref 19–32)
Chloride: 104 mEq/L (ref 96–112)
Creatinine, Ser: 0.98 mg/dL (ref 0.40–1.50)
GFR: 83.06 mL/min (ref >60.00)
Glucose, Bld: 104 mg/dL — ABNORMAL HIGH (ref 70–99)
Potassium: 4.4 mEq/L (ref 3.5–5.1)
SODIUM: 138 meq/L (ref 135–145)

## 2015-11-19 LAB — POC URINALSYSI DIPSTICK (AUTOMATED)
Bilirubin, UA: NEGATIVE
Glucose, UA: NEGATIVE
KETONES UA: NEGATIVE
Leukocytes, UA: NEGATIVE
Nitrite, UA: NEGATIVE
PH UA: 5.5
PROTEIN UA: NEGATIVE
RBC UA: NEGATIVE
SPEC GRAV UA: 1.025
UROBILINOGEN UA: 0.2

## 2015-11-19 LAB — PSA: PSA: 0.79 ng/mL (ref 0.10–4.00)

## 2015-11-19 LAB — LIPID PANEL
CHOL/HDL RATIO: 5
Cholesterol: 145 mg/dL (ref 0–200)
HDL: 31.8 mg/dL — AB (ref >39.00)
LDL Cholesterol: 76 mg/dL (ref 0–99)
NONHDL: 112.89
Triglycerides: 184 mg/dL — ABNORMAL HIGH (ref 0.0–149.0)
VLDL: 36.8 mg/dL (ref 0.0–40.0)

## 2015-11-19 LAB — MICROALBUMIN / CREATININE URINE RATIO
Creatinine,U: 198.6 mg/dL
Microalb Creat Ratio: 0.4 mg/g (ref 0.0–30.0)

## 2015-11-19 LAB — TSH: TSH: 1.73 u[IU]/mL (ref 0.35–4.50)

## 2015-11-19 LAB — HEMOGLOBIN A1C: Hgb A1c MFr Bld: 6.5 % (ref 4.6–6.5)

## 2015-11-26 ENCOUNTER — Ambulatory Visit (INDEPENDENT_AMBULATORY_CARE_PROVIDER_SITE_OTHER): Payer: 59 | Admitting: Internal Medicine

## 2015-11-26 ENCOUNTER — Encounter: Payer: Self-pay | Admitting: Internal Medicine

## 2015-11-26 VITALS — BP 100/70 | HR 91 | Temp 98.4°F | Ht 71.0 in | Wt 197.0 lb

## 2015-11-26 DIAGNOSIS — E039 Hypothyroidism, unspecified: Secondary | ICD-10-CM

## 2015-11-26 DIAGNOSIS — E119 Type 2 diabetes mellitus without complications: Secondary | ICD-10-CM | POA: Diagnosis not present

## 2015-11-26 DIAGNOSIS — Z0000 Encounter for general adult medical examination without abnormal findings: Secondary | ICD-10-CM | POA: Diagnosis not present

## 2015-11-26 NOTE — Progress Notes (Signed)
Subjective:    Patient ID: Tracy Turner, male    DOB: 04-12-1956, 60 y.o.   MRN: [id number]  HPI   Wt Readings from Last 3 Encounters:  11/26/15 197 lb (89.4 kg)  11/24/14 198 lb (89.8 kg)  06/14/14 206 lb (93.85 kg)   83 -year-old patient seen today for a preventive health examination.  Lab Results  Component Value Date   HGBA1C 6.5 11/19/2015    Patient continues to do well.  Remains quite active with golf and other physical activities.  No concerns or complaints.  No cardiopulmonary complaints    Family history father living, status post recent stroke.  Mother has recently completed chemotherapy for lung cancer  Past Medical History:  Diagnosis Date  . COPD (chronic obstructive pulmonary disease) (HCC)    mild  . Diabetes mellitus without complication ([place])    more controlled A1C after 30 # weight lost  . GERD (gastroesophageal reflux disease)   . History of colonic polyps   . Hyperlipidemia   . Hypothyroidism   . Tobacco abuse     Social History   Social History  . Marital status: Married    Spouse name: N/A  . Number of children: N/A  . Years of education: N/A   Occupational History  . Not on file.   Social History Main Topics  . Smoking status: Current Every Day Smoker    Packs/day: 0.50    Types: Cigarettes    Last attempt to quit: 08/30/2012  . Smokeless tobacco: Never Used     Comment: using Chantix...Marland Kitchen40 years on and off; 05-22-14 now smoking again < 1/2 ppd  . Alcohol use Yes     Comment: occ. maybe every month   . Drug use: No  . Sexual activity: Yes   Other Topics Concern  . Not on file   Social History Narrative  . No narrative on file    Past Surgical History:  Procedure Laterality Date  . COLONOSCOPY  2008  . ETT  2007  . INGUINAL HERNIA REPAIR Bilateral 05/25/2014   Procedure: BILATERAL LAPAROSCOPIC INGUINAL HERNIA WITH MESH;  Surgeon: Michael Boston, MD;  Location: [facility];  Service: General;  Laterality: Bilateral;  . INSERTION  OF MESH Bilateral 05/25/2014   Procedure: INSERTION OF MESH;  Surgeon: Michael Boston, MD;  Location: [facility];  Service: General;  Laterality: Bilateral;  . TOOTH EXTRACTION     several -molars    Family History  Problem Relation Age of Onset  . Heart disease Father   . Healthy Sister   . Healthy Brother   . Colon cancer Neg Hx     No Known Allergies  Current Outpatient Prescriptions on File Prior to Visit  Medication Sig Dispense Refill  . aspirin EC 81 MG tablet Take 81 mg by mouth daily.    . Glucosamine-Chondroit-Vit C-Mn (GLUCOSAMINE CHONDR 1500 COMPLX) CAPS Take 2 capsules by mouth daily.    Marland Kitchen levothyroxine (SYNTHROID, LEVOTHROID) 137 MCG tablet Take 1 tablet (137 mcg total) by mouth daily. 90 tablet 3  . metFORMIN (GLUCOPHAGE) 500 MG tablet Take 1 tablet (500 mg total) by mouth 2 (two) times daily with a meal. 180 tablet 3  . Multiple Vitamins-Minerals (MENS 50+ MULTI VITAMIN/MIN PO) Take 1 tablet by mouth daily.    . pantoprazole (PROTONIX) 40 MG tablet Take 1 tablet (40 mg total) by mouth daily. 90 tablet 3  . simvastatin (ZOCOR) 40 MG tablet Take 1 tablet (40 mg total) by mouth every  evening. 90 tablet 3  . STENDRA 100 MG TABS TAKE ONE TABLET BY MOUTH AS NEEDED 12 tablet 2  . varenicline (CHANTIX CONTINUING MONTH PAK) 1 MG tablet Take 1 tablet (1 mg total) by mouth 2 (two) times daily. 60 tablet 2  . varenicline (CHANTIX STARTING MONTH PAK) 0.5 MG X 11 & 1 MG X 42 tablet Take one 0.5 mg tablet by mouth once daily for 3 days, then increase to one 0.5 mg tablet twice daily for 4 days, then increase to one 1 mg tablet twice daily. (Patient not taking: Reported on 11/26/2015) 53 tablet 0   No current facility-administered medications on file prior to visit.     BP 100/70   Pulse 91   Temp 98.4 F (36.9 C) (Oral)   Ht 5\' 11"  (1.803 m)   Wt 197 lb (89.4 kg)   SpO2 98%   BMI 27.48 kg/m       Review of Systems  Constitutional: Negative for activity change, appetite change,  chills, fatigue and fever.  HENT: Negative for congestion, dental problem, ear pain, hearing loss, mouth sores, rhinorrhea, sinus pressure, sneezing, tinnitus, trouble swallowing and voice change.   Eyes: Negative for photophobia, pain, redness and visual disturbance.  Respiratory: Negative for apnea, cough, choking, chest tightness, shortness of breath and wheezing.   Cardiovascular: Negative for chest pain, palpitations and leg swelling.  Gastrointestinal: Negative for abdominal distention, abdominal pain, anal bleeding, blood in stool, constipation, diarrhea, nausea, rectal pain and vomiting.  Genitourinary: Negative for decreased urine volume, difficulty urinating, discharge, dysuria, flank pain, frequency, genital sores, hematuria, penile swelling, scrotal swelling, testicular pain and urgency.  Musculoskeletal: Negative for arthralgias, back pain, gait problem, joint swelling, myalgias, neck pain and neck stiffness.  Skin: Negative for color change, rash and wound.  Neurological: Negative for dizziness, tremors, seizures, syncope, facial asymmetry, speech difficulty, weakness, light-headedness, numbness and headaches.  Hematological: Negative for adenopathy. Does not bruise/bleed easily.  Psychiatric/Behavioral: Negative for agitation, behavioral problems, confusion, decreased concentration, dysphoric mood, hallucinations, self-injury, sleep disturbance and suicidal ideas. The patient is not nervous/anxious.        Objective:   Physical Exam  Constitutional: He appears well-developed and well-nourished.  HENT:  Head: Normocephalic and atraumatic.  Right Ear: External ear normal.  Left Ear: External ear normal.  Nose: Nose normal.  Mouth/Throat: Oropharynx is clear and moist.  Eyes: Conjunctivae and EOM are normal. Pupils are equal, round, and reactive to light. No scleral icterus.  Neck: Normal range of motion. Neck supple. No JVD present. No thyromegaly present.  Cardiovascular:  Regular rhythm, normal heart sounds and intact distal pulses.  Exam reveals no gallop and no friction rub.   No murmur heard. Slight decreased right dorsalis pedis pulse  Pulmonary/Chest: Effort normal and breath sounds normal. He exhibits no tenderness.  Abdominal: Soft. Bowel sounds are normal. He exhibits no distension and no mass. There is no tenderness.  Genitourinary: Prostate normal and penis normal.  Musculoskeletal: Normal range of motion. He exhibits no edema or tenderness.  Lymphadenopathy:    He has no cervical adenopathy.  Neurological: He is alert. He has normal reflexes. No cranial nerve deficit. Coordination normal.  Skin: Skin is warm and dry. No rash noted.  Psychiatric: He has a normal mood and affect. His behavior is normal.          Assessment & Plan:   Preventive health examination Diabetes mellitus.  Excellent control Hypothyroidism.  Continue present levothyroxine dose Mild dyslipidemia GERD.  Stable off therapy   No change in therapy Recheck 6 months  Nyoka Cowden, MD

## 2015-11-26 NOTE — Progress Notes (Signed)
Pre visit review using our clinic review tool, if applicable. No additional management support is needed unless otherwise documented below in the visit note. 

## 2015-11-26 NOTE — Patient Instructions (Signed)
Limit your sodium (Salt) intake  Please check your blood pressure on a regular basis.  If it is consistently greater than 150/90, please make an office appointment.    It is important that you exercise regularly, at least 20 minutes 3 to 4 times per week.  If you develop chest pain or shortness of breath seek  medical attention.  Return in one year for follow-up  

## 2015-11-30 ENCOUNTER — Telehealth: Payer: Self-pay | Admitting: Internal Medicine

## 2015-11-30 MED ORDER — AVANAFIL 100 MG PO TABS: 1.0000 | ORAL_TABLET | ORAL | 2 refills | Status: DC | PRN

## 2015-11-30 NOTE — Telephone Encounter (Signed)
Medication sent to pharmacy  

## 2015-11-30 NOTE — Telephone Encounter (Signed)
Pt need new Rx for Stendra     Pharm: [facility]

## 2015-12-11 ENCOUNTER — Telehealth: Payer: Self-pay

## 2015-12-11 NOTE — Telephone Encounter (Signed)
Prior Authorization completed for Avanafil (STENDRA) 100 MG TABS  Key: [id number]

## 2015-12-24 ENCOUNTER — Telehealth: Payer: Self-pay

## 2015-12-24 NOTE — Telephone Encounter (Signed)
Prior Authorization denial received for Avanafil (STENDRA) 100 MG TABS  Stendra can be approved if:  Your patient has a history of failure or intolerance to two of the following (name of drug, date tried, and reason for failure must be provided):  (a)Cialis (b)Levitra (c) Viagra  Please note: These products may require prior authorization

## 2016-02-10 ENCOUNTER — Other Ambulatory Visit: Payer: Self-pay | Admitting: Internal Medicine

## 2016-04-24 ENCOUNTER — Telehealth: Payer: Self-pay | Admitting: Internal Medicine

## 2016-04-24 NOTE — Telephone Encounter (Signed)
Form filled out & faxed back to insurance company.

## 2016-04-24 NOTE — Telephone Encounter (Signed)
Pt state that [facility] will be sending a PA for the [facility] not sure if they will be wanting to change to another Rx.

## 2016-04-25 NOTE — Telephone Encounter (Signed)
Patient has to have a failure or intolerance to two of the following for Stendra to be approved:  Cialis Levitra Viagra

## 2016-05-02 ENCOUNTER — Telehealth: Payer: Self-pay | Admitting: Internal Medicine

## 2016-05-02 NOTE — Telephone Encounter (Signed)
Pt would like stendra however he must try and fail either  Viagra,levitra or cialis. The stendra will needs a PA . Pt would like md to call one of three medication [facility] [address]

## 2016-05-06 MED ORDER — SILDENAFIL CITRATE 100 MG PO TABS: 50.0000 mg | ORAL_TABLET | Freq: Every day | ORAL | 1 refills | Status: DC | PRN

## 2016-05-06 NOTE — Telephone Encounter (Signed)
Pt state that the insurance will pay the least for cialis and wanted to know Dr. Marthann Schiller take on this if it is okay he will go for it.   Pharm:  [facility].

## 2016-05-06 NOTE — Telephone Encounter (Signed)
Please call in a prescription for Viagra 100 mg #6 one half tablet daily as needed, refill times 6.  Notify patient.  Prescription called in, and we do have discount coupons in the office if he would like to drop by

## 2016-05-06 NOTE — Telephone Encounter (Signed)
Please see message and advise 

## 2016-05-06 NOTE — Telephone Encounter (Signed)
Spoke to pt, told him PA was denied for Tracy Turner and must try what is on formulary. Dr. Raliegh Ip said to try Viagra 50 mg as needed. Pt verbalized understanding and said send Rx to [facility] for 90 day supply. Told pt okay will send to [facility]. Rx sent. Pt verbalized understanding.

## 2016-05-06 NOTE — Telephone Encounter (Signed)
Viagra 100.  #6 tablets  , 1 half tablet daily as needed, refill times 4

## 2016-05-06 NOTE — Addendum Note (Signed)
Addended by: Marian Sorrow on: 05/06/2016 04:19 PM   Modules accepted: Orders

## 2016-05-07 ENCOUNTER — Other Ambulatory Visit: Payer: Self-pay | Admitting: Internal Medicine

## 2016-05-07 MED ORDER — SILDENAFIL CITRATE 100 MG PO TABS: 50.0000 mg | ORAL_TABLET | ORAL | 6 refills | Status: DC | PRN

## 2016-05-07 NOTE — Telephone Encounter (Signed)
Spoke with pt and informed him that  Viagra 100 mg #6 one half tablet daily as needed, refill times 6.was called in to [facility] in [place]. Pt verbalized understanding.

## 2016-05-28 ENCOUNTER — Encounter: Payer: Self-pay | Admitting: Internal Medicine

## 2016-05-28 ENCOUNTER — Ambulatory Visit (INDEPENDENT_AMBULATORY_CARE_PROVIDER_SITE_OTHER): Payer: 59 | Admitting: Internal Medicine

## 2016-05-28 VITALS — BP 126/80 | HR 79 | Temp 98.1°F | Ht 71.0 in | Wt 210.4 lb

## 2016-05-28 DIAGNOSIS — E119 Type 2 diabetes mellitus without complications: Secondary | ICD-10-CM | POA: Diagnosis not present

## 2016-05-28 DIAGNOSIS — E039 Hypothyroidism, unspecified: Secondary | ICD-10-CM | POA: Diagnosis not present

## 2016-05-28 LAB — HEMOGLOBIN A1C: HEMOGLOBIN A1C: 6.8 % — AB (ref 4.6–6.5)

## 2016-05-28 NOTE — Progress Notes (Signed)
Pre visit review using our clinic review tool, if applicable. No additional management support is needed unless otherwise documented below in the visit note. 

## 2016-05-28 NOTE — Progress Notes (Signed)
Subjective:    Patient ID: Tracy Turner, male    DOB: April 13, 1956, 61 y.o.   MRN: [id number]  HPI  61 year old patient who is seen today for follow-up.  He has a history of type 2 diabetes which has been controlled very nicely on metformin therapy only. He has hypothyroidism. Unfortunately, he continues to smoke.  He does have an active Chantix prescription.  No cardio pulmonary complaints  Past Medical History:  Diagnosis Date  . COPD (chronic obstructive pulmonary disease) (HCC)    mild  . Diabetes mellitus without complication ([place])    more controlled A1C after 30 # weight lost  . GERD (gastroesophageal reflux disease)   . History of colonic polyps   . Hyperlipidemia   . Hypothyroidism   . Tobacco abuse      Social History   Social History  . Marital status: Married    Spouse name: N/A  . Number of children: N/A  . Years of education: N/A   Occupational History  . Not on file.   Social History Main Topics  . Smoking status: Current Every Day Smoker    Packs/day: 0.50    Types: Cigarettes    Last attempt to quit: 08/30/2012  . Smokeless tobacco: Never Used     Comment: using Chantix...Marland Kitchen40 years on and off; 05-22-14 now smoking again < 1/2 ppd  . Alcohol use Yes     Comment: occ. maybe every month   . Drug use: No  . Sexual activity: Yes   Other Topics Concern  . Not on file   Social History Narrative  . No narrative on file    Past Surgical History:  Procedure Laterality Date  . COLONOSCOPY  2008  . ETT  2007  . INGUINAL HERNIA REPAIR Bilateral 05/25/2014   Procedure: BILATERAL LAPAROSCOPIC INGUINAL HERNIA WITH MESH;  Surgeon: Michael Boston, MD;  Location: [facility];  Service: General;  Laterality: Bilateral;  . INSERTION OF MESH Bilateral 05/25/2014   Procedure: INSERTION OF MESH;  Surgeon: Michael Boston, MD;  Location: [facility];  Service: General;  Laterality: Bilateral;  . TOOTH EXTRACTION     several -molars    Family History  Problem Relation Age of  Onset  . Heart disease Father   . Healthy Sister   . Healthy Brother   . Colon cancer Neg Hx     No Known Allergies  Current Outpatient Prescriptions on File Prior to Visit  Medication Sig Dispense Refill  . aspirin EC 81 MG tablet Take 81 mg by mouth daily.    Marland Kitchen levothyroxine (SYNTHROID, LEVOTHROID) 137 MCG tablet TAKE 1 TABLET BY MOUTH  DAILY 90 tablet 1  . metFORMIN (GLUCOPHAGE) 500 MG tablet TAKE 1 TABLET BY MOUTH TWO  TIMES DAILY WITH MEALS 180 tablet 1  . Multiple Vitamins-Minerals (MENS 50+ MULTI VITAMIN/MIN PO) Take 1 tablet by mouth daily.    . pantoprazole (PROTONIX) 40 MG tablet TAKE 1 TABLET BY MOUTH  DAILY 90 tablet 1  . sildenafil (VIAGRA) 100 MG tablet Take 0.5 tablets (50 mg total) by mouth daily as needed for erectile dysfunction. 18 tablet 1  . sildenafil (VIAGRA) 100 MG tablet Take 0.5 tablets (50 mg total) by mouth as needed for erectile dysfunction. 6 tablet 6  . simvastatin (ZOCOR) 40 MG tablet TAKE 1 TABLET BY MOUTH  EVERY EVENING 90 tablet 1  . varenicline (CHANTIX CONTINUING MONTH PAK) 1 MG tablet Take 1 tablet (1 mg total) by mouth 2 (two) times daily. [place]  tablet 2  . varenicline (CHANTIX STARTING MONTH PAK) 0.5 MG X 11 & 1 MG X 42 tablet Take one 0.5 mg tablet by mouth once daily for 3 days, then increase to one 0.5 mg tablet twice daily for 4 days, then increase to one 1 mg tablet twice daily. 53 tablet 0   No current facility-administered medications on file prior to visit.     BP 126/80 (BP Location: Right Arm, Patient Position: Sitting, Cuff Size: Normal)   Pulse 79   Temp 98.1 F (36.7 C) (Oral)   Ht 5\' 11"  (1.803 m)   Wt 210 lb 6.4 oz (95.4 kg)   SpO2 95%   BMI 29.34 kg/m     Review of Systems  Constitutional: Negative for appetite change, chills, fatigue and fever.  HENT: Negative for congestion, dental problem, ear pain, hearing loss, sore throat, tinnitus, trouble swallowing and voice change.   Eyes: Negative for pain, discharge and visual  disturbance.  Respiratory: Negative for cough, chest tightness, wheezing and stridor.   Cardiovascular: Negative for chest pain, palpitations and leg swelling.  Gastrointestinal: Negative for abdominal distention, abdominal pain, blood in stool, constipation, diarrhea, nausea and vomiting.  Genitourinary: Negative for difficulty urinating, discharge, flank pain, genital sores, hematuria and urgency.  Musculoskeletal: Negative for arthralgias, back pain, gait problem, joint swelling, myalgias and neck stiffness.  Skin: Negative for rash.  Neurological: Negative for dizziness, syncope, speech difficulty, weakness, numbness and headaches.  Hematological: Negative for adenopathy. Does not bruise/bleed easily.  Psychiatric/Behavioral: Negative for behavioral problems and dysphoric mood. The patient is not nervous/anxious.        Objective:   Physical Exam  Constitutional: He is oriented to person, place, and time. He appears well-developed.  HENT:  Head: Normocephalic.  Right Ear: External ear normal.  Left Ear: External ear normal.  Eyes: Conjunctivae and EOM are normal.  Neck: Normal range of motion.  Cardiovascular: Normal rate and normal heart sounds.   Pulmonary/Chest: Breath sounds normal.  Abdominal: Bowel sounds are normal.  Musculoskeletal: Normal range of motion. He exhibits no edema or tenderness.  Neurological: He is alert and oriented to person, place, and time.  Psychiatric: He has a normal mood and affect. His behavior is normal.          Assessment & Plan:   Diabetes mellitus.  Will check hemoglobin A1c Hypothyroidism.  Continue supplement Ongoing tobacco use.  Total smoking cessation encouraged  CPX 6 months  [id number],PETER Pilar Plate

## 2016-05-28 NOTE — Patient Instructions (Signed)
Limit your sodium (Salt) intake    It is important that you exercise regularly, at least 20 minutes 3 to 4 times per week.  If you develop chest pain or shortness of breath seek  medical attention.   Please check your hemoglobin A1c every 3-6  Months  Smoking tobacco is very bad for your health. You should stop smoking immediately.

## 2016-08-04 ENCOUNTER — Other Ambulatory Visit: Payer: Self-pay | Admitting: Internal Medicine

## 2016-10-06 ENCOUNTER — Other Ambulatory Visit: Payer: Self-pay | Admitting: Internal Medicine

## 2016-10-14 ENCOUNTER — Encounter: Payer: Self-pay | Admitting: *Deleted

## 2017-01-16 ENCOUNTER — Other Ambulatory Visit: Payer: Self-pay | Admitting: Internal Medicine

## 2017-01-22 ENCOUNTER — Encounter: Payer: Self-pay | Admitting: Internal Medicine

## 2017-02-06 DIAGNOSIS — D234 Other benign neoplasm of skin of scalp and neck: Secondary | ICD-10-CM | POA: Diagnosis not present

## 2017-02-06 DIAGNOSIS — D485 Neoplasm of uncertain behavior of skin: Secondary | ICD-10-CM | POA: Diagnosis not present

## 2017-02-06 DIAGNOSIS — B079 Viral wart, unspecified: Secondary | ICD-10-CM | POA: Diagnosis not present

## 2017-02-18 ENCOUNTER — Encounter: Payer: Self-pay | Admitting: Internal Medicine

## 2017-02-26 ENCOUNTER — Encounter: Payer: Self-pay | Admitting: Internal Medicine

## 2017-03-13 ENCOUNTER — Telehealth: Payer: Self-pay | Admitting: Internal Medicine

## 2017-03-13 NOTE — Telephone Encounter (Signed)
Pt would like to have the following done   Hepatitis C Screening  Pneumococcal Polysaccharide Vaccine  Ophthalmology Exam  Urine Microalbumin  Foot Exam  Hemoglobin A1c  Influenza Vaccine  Tetanus/Tdap      Is pt eligible for the following.

## 2017-03-16 NOTE — Telephone Encounter (Signed)
Please advise 

## 2017-03-16 NOTE — Telephone Encounter (Signed)
Please schedule return office visit

## 2017-03-17 ENCOUNTER — Ambulatory Visit (AMBULATORY_SURGERY_CENTER): Payer: Self-pay

## 2017-03-17 VITALS — Ht 72.0 in | Wt 211.0 lb

## 2017-03-17 DIAGNOSIS — Z86010 Personal history of colonic polyps: Secondary | ICD-10-CM

## 2017-03-17 MED ORDER — NA SULFATE-K SULFATE-MG SULF 17.5-3.13-1.6 GM/177ML PO SOLN: 1.0000 | Freq: Once | ORAL | 0 refills | Status: AC

## 2017-03-17 NOTE — Progress Notes (Signed)
Per pt, no allergies to soy or egg products.Pt not taking any weight loss meds or using  O2 at home.   Pt refused Emmi video. 

## 2017-03-19 NOTE — Telephone Encounter (Signed)
Spoke with pt and a OV has been scheduled.

## 2017-03-20 ENCOUNTER — Telehealth: Payer: Self-pay | Admitting: Internal Medicine

## 2017-03-20 NOTE — Telephone Encounter (Signed)
Patient returned my call and he said the [employer] would be too expensive for him.  I told him I would call in a $15 dollar off coupon to [place].  That would bring it down under $100.  He was satisfied with this and Coupon was phoned in to pharmacy.  The cost is now $82.    B. Benitez, CMA PV  Suprep $15 off  BIN:  [id number] PCN  [id number] Group:  [id number] ID:  [id number]

## 2017-03-20 NOTE — Telephone Encounter (Signed)
Returned patient's call and no answer.  Left message on voicemail for him to call back.  Elizabeth Palau, CMA  PV

## 2017-04-01 ENCOUNTER — Ambulatory Visit (INDEPENDENT_AMBULATORY_CARE_PROVIDER_SITE_OTHER): Payer: 59 | Admitting: Internal Medicine

## 2017-04-01 ENCOUNTER — Encounter: Payer: Self-pay | Admitting: Internal Medicine

## 2017-04-01 VITALS — BP 108/68 | HR 74 | Temp 98.2°F | Ht 72.0 in | Wt 205.8 lb

## 2017-04-01 DIAGNOSIS — Z125 Encounter for screening for malignant neoplasm of prostate: Secondary | ICD-10-CM

## 2017-04-01 DIAGNOSIS — Z23 Encounter for immunization: Secondary | ICD-10-CM

## 2017-04-01 DIAGNOSIS — E039 Hypothyroidism, unspecified: Secondary | ICD-10-CM

## 2017-04-01 DIAGNOSIS — Z0000 Encounter for general adult medical examination without abnormal findings: Secondary | ICD-10-CM

## 2017-04-01 DIAGNOSIS — E119 Type 2 diabetes mellitus without complications: Secondary | ICD-10-CM

## 2017-04-01 DIAGNOSIS — E785 Hyperlipidemia, unspecified: Secondary | ICD-10-CM

## 2017-04-01 LAB — COMPREHENSIVE METABOLIC PANEL
ALBUMIN: 4.6 g/dL (ref 3.5–5.2)
ALK PHOS: 41 U/L (ref 39–117)
ALT: 18 U/L (ref 0–53)
AST: 12 U/L (ref 0–37)
BILIRUBIN TOTAL: 0.6 mg/dL (ref 0.2–1.2)
BUN: 19 mg/dL (ref 6–23)
CALCIUM: 10.1 mg/dL (ref 8.4–10.5)
CO2: 29 meq/L (ref 19–32)
Chloride: 102 mEq/L (ref 96–112)
Creatinine, Ser: 1.19 mg/dL (ref 0.40–1.50)
GFR: 66.08 mL/min (ref >60.00)
Glucose, Bld: 99 mg/dL (ref 70–99)
Potassium: 4.2 mEq/L (ref 3.5–5.1)
Sodium: 139 mEq/L (ref 135–145)
Total Protein: 7 g/dL (ref 6.0–8.3)

## 2017-04-01 LAB — LIPID PANEL
CHOL/HDL RATIO: 5
CHOLESTEROL: 176 mg/dL (ref 0–200)
HDL: 35.8 mg/dL — AB (ref >39.00)
LDL Cholesterol: 105 mg/dL — ABNORMAL HIGH (ref 0–99)
NonHDL: 140.14
TRIGLYCERIDES: 176 mg/dL — AB (ref 0.0–149.0)
VLDL: 35.2 mg/dL (ref 0.0–40.0)

## 2017-04-01 LAB — CBC WITH DIFFERENTIAL/PLATELET
BASOS ABS: 0 10*3/uL (ref 0.0–0.1)
Basophils Relative: 0.5 % (ref 0.0–3.0)
EOS ABS: 0.2 10*3/uL (ref 0.0–0.7)
EOS PCT: 3.1 % (ref 0.0–5.0)
HCT: 45.9 % (ref 39.0–52.0)
HEMOGLOBIN: 15.6 g/dL (ref 13.0–17.0)
LYMPHS ABS: 1.7 10*3/uL (ref 0.7–4.0)
Lymphocytes Relative: 22.3 % (ref 12.0–46.0)
MCHC: 33.9 g/dL (ref 30.0–36.0)
MCV: 93.5 fl (ref 78.0–100.0)
MONO ABS: 0.6 10*3/uL (ref 0.1–1.0)
Monocytes Relative: 7.9 % (ref 3.0–12.0)
NEUTROS PCT: 66.2 % (ref 43.0–77.0)
Neutro Abs: 5 10*3/uL (ref 1.4–7.7)
Platelets: 259 10*3/uL (ref 150.0–400.0)
RBC: 4.91 Mil/uL (ref 4.22–5.81)
RDW: 12.9 % (ref 11.5–15.5)
WBC: 7.6 10*3/uL (ref 4.0–10.5)

## 2017-04-01 LAB — MICROALBUMIN / CREATININE URINE RATIO
CREATININE, U: 171.3 mg/dL
MICROALB/CREAT RATIO: 0.4 mg/g (ref 0.0–30.0)

## 2017-04-01 LAB — HEMOGLOBIN A1C: HEMOGLOBIN A1C: 6.8 % — AB (ref 4.6–6.5)

## 2017-04-01 LAB — TSH: TSH: 3.23 u[IU]/mL (ref 0.35–4.50)

## 2017-04-01 LAB — PSA: PSA: 0.95 ng/mL (ref 0.10–4.00)

## 2017-04-01 NOTE — Progress Notes (Signed)
Subjective:    Patient ID: Tracy Turner, male    DOB: 08-06-1955, 61 y.o.   MRN: [id number]  HPI  61 year old patient who has a history of type 2 diabetes who is seen today for his annual physical. He is scheduled for follow-up colonoscopy in 7 days. He continues to smoke.  Other medical illnesses include dyslipidemia gastroesophageal reflux disease as well as hypothyroidism.   Family history.  Mother age 51 has dementia and history of small cell lung cancer Father age 24 one half brother in good health  One half sister whose health status unknown  Past Medical History:  Diagnosis Date  . COPD (chronic obstructive pulmonary disease) (HCC)    mild  . Diabetes mellitus without complication ([place])    more controlled A1C after 30 # weight lost  . GERD (gastroesophageal reflux disease)   . History of colonic polyps   . Hyperlipidemia   . Hypothyroidism   . Tobacco abuse      Social History   Socioeconomic History  . Marital status: Married    Spouse name: Not on file  . Number of children: Not on file  . Years of education: Not on file  . Highest education level: Not on file  Social Needs  . Financial resource strain: Not on file  . Food insecurity - worry: Not on file  . Food insecurity - inability: Not on file  . Transportation needs - medical: Not on file  . Transportation needs - non-medical: Not on file  Occupational History  . Not on file  Tobacco Use  . Smoking status: Current Every Day Smoker    Packs/day: 0.50    Types: Cigarettes    Last attempt to quit: 08/30/2012    Years since quitting: 4.5  . Smokeless tobacco: Never Used  . Tobacco comment: using Chantix...Marland Kitchen40 years on and off; 05-22-14 now smoking again < 1/2 ppd  Substance and Sexual Activity  . Alcohol use: Yes    Comment: occ. maybe every month   . Drug use: No  . Sexual activity: Yes  Other Topics Concern  . Not on file  Social History Narrative  . Not on file    Past Surgical History:    Procedure Laterality Date  . COLONOSCOPY  2008  . ETT  2007  . INGUINAL HERNIA REPAIR Bilateral 05/25/2014   Procedure: BILATERAL LAPAROSCOPIC INGUINAL HERNIA WITH MESH;  Surgeon: Michael Boston, MD;  Location: [facility];  Service: General;  Laterality: Bilateral;  . INSERTION OF MESH Bilateral 05/25/2014   Procedure: INSERTION OF MESH;  Surgeon: Michael Boston, MD;  Location: [facility];  Service: General;  Laterality: Bilateral;  . TOOTH EXTRACTION     several -molars    Family History  Problem Relation Age of Onset  . Heart disease Father   . Healthy Sister   . Healthy Brother   . Heart disease Mother   . Dementia Mother   . Colon cancer Neg Hx     No Known Allergies  Current Outpatient Medications on File Prior to Visit  Medication Sig Dispense Refill  . aspirin EC 81 MG tablet Take 81 mg by mouth daily.    Marland Kitchen levothyroxine (SYNTHROID, LEVOTHROID) 137 MCG tablet TAKE 1 TABLET BY MOUTH  DAILY 90 tablet 2  . metFORMIN (GLUCOPHAGE) 500 MG tablet TAKE 1 TABLET BY MOUTH TWO  TIMES DAILY WITH MEALS 180 tablet 1  . Multiple Vitamins-Minerals (MENS 50+ MULTI VITAMIN/MIN PO) Take 1 tablet by mouth daily.    Marland Kitchen  pantoprazole (PROTONIX) 40 MG tablet TAKE 1 TABLET BY MOUTH  DAILY 90 tablet 3  . sildenafil (VIAGRA) 100 MG tablet Take 0.5 tablets (50 mg total) by mouth daily as needed for erectile dysfunction. 18 tablet 1  . sildenafil (VIAGRA) 100 MG tablet Take 0.5 tablets (50 mg total) by mouth as needed for erectile dysfunction. 6 tablet 6  . simvastatin (ZOCOR) 40 MG tablet TAKE 1 TABLET BY MOUTH  EVERY EVENING 90 tablet 3  . varenicline (CHANTIX CONTINUING MONTH PAK) 1 MG tablet Take 1 tablet (1 mg total) by mouth 2 (two) times daily. 60 tablet 2  . varenicline (CHANTIX STARTING MONTH PAK) 0.5 MG X 11 & 1 MG X 42 tablet Take one 0.5 mg tablet by mouth once daily for 3 days, then increase to one 0.5 mg tablet twice daily for 4 days, then increase to one 1 mg tablet twice daily. 53 tablet 0   No  current facility-administered medications on file prior to visit.     BP 108/68 (BP Location: Left Arm, Patient Position: Sitting, Cuff Size: Large)   Pulse 74   Temp 98.2 F (36.8 C) (Oral)   Ht 6' (1.829 m)   Wt 205 lb 12.8 oz (93.4 kg)   SpO2 96%   BMI 27.91 kg/m     Review of Systems  Constitutional: Negative for appetite change, chills, fatigue and fever.  HENT: Negative for congestion, dental problem, ear pain, hearing loss, sore throat, tinnitus, trouble swallowing and voice change.   Eyes: Negative for pain, discharge and visual disturbance.  Respiratory: Negative for cough, chest tightness, wheezing and stridor.   Cardiovascular: Negative for chest pain, palpitations and leg swelling.  Gastrointestinal: Negative for abdominal distention, abdominal pain, blood in stool, constipation, diarrhea, nausea and vomiting.  Genitourinary: Negative for difficulty urinating, discharge, flank pain, genital sores, hematuria and urgency.  Musculoskeletal: Negative for arthralgias, back pain, gait problem, joint swelling, myalgias and neck stiffness.  Skin: Negative for rash.  Neurological: Negative for dizziness, syncope, speech difficulty, weakness, numbness and headaches.  Hematological: Negative for adenopathy. Does not bruise/bleed easily.  Psychiatric/Behavioral: Negative for behavioral problems and dysphoric mood. The patient is not nervous/anxious.        Objective:   Physical Exam  Constitutional: He appears well-developed and well-nourished.  HENT:  Head: Normocephalic and atraumatic.  Right Ear: External ear normal.  Left Ear: External ear normal.  Nose: Nose normal.  Mouth/Throat: Oropharynx is clear and moist.  Eyes: Conjunctivae and EOM are normal. Pupils are equal, round, and reactive to light. No scleral icterus.  Neck: Normal range of motion. Neck supple. No JVD present. No thyromegaly present.  Cardiovascular: Regular rhythm, normal heart sounds and intact distal  pulses. Exam reveals no gallop and no friction rub.  No murmur heard. Decreased right dorsalis pedis pulse  Pulmonary/Chest: Effort normal and breath sounds normal. He exhibits no tenderness.  Abdominal: Soft. Bowel sounds are normal. He exhibits no distension and no mass. There is no tenderness.  Genitourinary: Penis normal.  Musculoskeletal: Normal range of motion. He exhibits no edema or tenderness.  Lymphadenopathy:    He has no cervical adenopathy.  Neurological: He is alert. He has normal reflexes. No cranial nerve deficit. Coordination normal.  Skin: Skin is warm and dry. No rash noted.  Psychiatric: He has a normal mood and affect. His behavior is normal.          Assessment & Plan:   Preventive health examination Diabetes mellitus.  Will review  hemoglobin A1c lipid profile and urine for microalbumin Hypothyroidism.  Will check TSH Ongoing tobacco use.  Total smoking cessation encouraged  Follow-up 3-6 months  Jazyah Butsch Pilar Plate

## 2017-04-01 NOTE — Patient Instructions (Signed)
Limit your sodium (Salt) intake    It is important that you exercise regularly, at least 20 minutes 3 to 4 times per week.  If you develop chest pain or shortness of breath seek  medical attention.   Please check your hemoglobin A1c every 3-6 months  Please see your eye doctor yearly to check for diabetic eye damage

## 2017-04-02 LAB — HEPATITIS C ANTIBODY
Hepatitis C Ab: NONREACTIVE
SIGNAL TO CUT-OFF: 0.03 (ref <1.00)

## 2017-04-08 ENCOUNTER — Encounter: Payer: Self-pay | Admitting: Internal Medicine

## 2017-04-08 ENCOUNTER — Other Ambulatory Visit: Payer: Self-pay

## 2017-04-08 ENCOUNTER — Ambulatory Visit (AMBULATORY_SURGERY_CENTER): Payer: 59 | Admitting: Internal Medicine

## 2017-04-08 VITALS — BP 111/63 | HR 67 | Temp 98.4°F | Resp 11 | Ht 72.0 in | Wt 211.0 lb

## 2017-04-08 DIAGNOSIS — Z1211 Encounter for screening for malignant neoplasm of colon: Secondary | ICD-10-CM

## 2017-04-08 DIAGNOSIS — D123 Benign neoplasm of transverse colon: Secondary | ICD-10-CM

## 2017-04-08 DIAGNOSIS — K635 Polyp of colon: Secondary | ICD-10-CM

## 2017-04-08 MED ORDER — SODIUM CHLORIDE 0.9 % IV SOLN: 500.0000 mL | Freq: Once | INTRAVENOUS | Status: DC

## 2017-04-08 NOTE — Progress Notes (Signed)
To PACU, VSS. Report to [email or web address] 

## 2017-04-08 NOTE — Patient Instructions (Signed)
YOU HAD AN ENDOSCOPIC PROCEDURE TODAY AT [facility]:   Refer to the procedure report that was given to you for any specific questions about what was found during the examination.  If the procedure report does not answer your questions, please call your gastroenterologist to clarify.  If you requested that your care partner not be given the details of your procedure findings, then the procedure report has been included in a sealed envelope for you to review at your convenience later.  YOU SHOULD EXPECT: Some feelings of bloating in the abdomen. Passage of more gas than usual.  Walking can help get rid of the air that was put into your GI tract during the procedure and reduce the bloating. If you had a lower endoscopy (such as a colonoscopy or flexible sigmoidoscopy) you may notice spotting of blood in your stool or on the toilet paper. If you underwent a bowel prep for your procedure, you may not have a normal bowel movement for a few days.  Please Note:  You might notice some irritation and congestion in your nose or some drainage.  This is from the oxygen used during your procedure.  There is no need for concern and it should clear up in a day or so.  SYMPTOMS TO REPORT IMMEDIATELY:   Following lower endoscopy (colonoscopy or flexible sigmoidoscopy):  Excessive amounts of blood in the stool  Significant tenderness or worsening of abdominal pains  Swelling of the abdomen that is new, acute  Fever of 100F or higher  For urgent or emergent issues, a gastroenterologist can be reached at any hour by calling [phone].   DIET:  We do recommend a small meal at first, but then you may proceed to your regular diet.  Drink plenty of fluids but you should avoid alcoholic beverages for 24 hours.  ACTIVITY:  You should plan to take it easy for the rest of today and you should NOT DRIVE or use heavy machinery until tomorrow (because of the sedation medicines used during the test).     FOLLOW UP: Our staff will call the number listed on your records the next business day following your procedure to check on you and address any questions or concerns that you may have regarding the information given to you following your procedure. If we do not reach you, we will leave a message.  However, if you are feeling well and you are not experiencing any problems, there is no need to return our call.  We will assume that you have returned to your regular daily activities without incident.  If any biopsies were taken you will be contacted by phone or by letter within the next 1-3 weeks.  Please call us at [phone] if you have not heard about the biopsies in 3 weeks.   Await for biopsy results to determine next repeat Colonoscopy screening Polyps (handout given) Diverticulosis (handout given) Hemorrhoids (handout given) High Fiber Diet (handout given)   SIGNATURES/CONFIDENTIALITY: You and/or your care partner have signed paperwork which will be entered into your electronic medical record.  These signatures attest to the fact that that the information above on your After Visit Summary has been reviewed and is understood.  Full responsibility of the confidentiality of this discharge information lies with you and/or your care-partner.

## 2017-04-08 NOTE — Op Note (Signed)
[facility] Patient Name: Tracy Turner Procedure Date: 04/08/2017 2:13 PM MRN: [id number] Endoscopist: Jerene Bears , MD Age: 61 Referring MD:  Date of Birth: [birth year] Gender: Male Account #: [id number] Procedure:                Colonoscopy Indications:              Screening for colorectal malignant neoplasm, Last                            colonoscopy 10 years ago Medicines:                Monitored Anesthesia Care Procedure:                Pre-Anesthesia Assessment:                           - Prior to the procedure, a History and Physical                            was performed, and patient medications and                            allergies were reviewed. The patient's tolerance of                            previous anesthesia was also reviewed. The risks                            and benefits of the procedure and the sedation                            options and risks were discussed with the patient.                            All questions were answered, and informed consent                            was obtained. Prior Anticoagulants: The patient has                            taken no previous anticoagulant or antiplatelet                            agents. ASA Grade Assessment: II - A patient with                            mild systemic disease. After reviewing the risks                            and benefits, the patient was deemed in                            satisfactory condition to undergo the procedure.  After obtaining informed consent, the colonoscope                            was passed under direct vision. Throughout the                            procedure, the patient's blood pressure, pulse, and                            oxygen saturations were monitored continuously. The                            Colonoscope was introduced through the anus and                            advanced to the the cecum, identified by                             appendiceal orifice and ileocecal valve. The                            colonoscopy was performed without difficulty. The                            patient tolerated the procedure well. The quality                            of the bowel preparation was good. The ileocecal                            valve, appendiceal orifice, and rectum were                            photographed. Scope In: 2:22:48 PM Scope Out: 2:39:33 PM Scope Withdrawal Time: 0 hours 11 minutes 0 seconds  Total Procedure Duration: 0 hours 16 minutes 45 seconds  Findings:                 The digital rectal exam was normal.                           A 3 mm polyp was found in the proximal transverse                            colon. The polyp was sessile. The polyp was removed                            with a cold biopsy forceps. Resection and retrieval                            were complete.                           Multiple small and large-mouthed diverticula were  found in the sigmoid colon.                           Internal hemorrhoids were found during                            retroflexion. The hemorrhoids were small. Complications:            No immediate complications. Estimated Blood Loss:     Estimated blood loss: none. Impression:               - One 3 mm polyp in the proximal transverse colon,                            removed with a cold biopsy forceps. Resected and                            retrieved.                           - Severe diverticulosis in the sigmoid colon.                           - Small internal hemorrhoids. Recommendation:           - Patient has a contact number available for                            emergencies. The signs and symptoms of potential                            delayed complications were discussed with the                            patient. Return to normal activities tomorrow.                             Written discharge instructions were provided to the                            patient.                           - Resume previous diet.                           - Continue present medications.                           - Await pathology results.                           - Repeat colonoscopy is recommended. The                            colonoscopy date will be determined after pathology  results from today's exam become available for                            review. Jerene Bears, MD 04/08/2017 2:44:34 PM This report has been signed electronically.

## 2017-04-08 NOTE — Progress Notes (Signed)
Called to room to assist during endoscopic procedure.  Patient ID and intended procedure confirmed with present staff. Received instructions for my participation in the procedure from the performing physician.  

## 2017-04-09 ENCOUNTER — Telehealth: Payer: Self-pay

## 2017-04-09 NOTE — Telephone Encounter (Signed)
  Follow up Call-  Call back number 04/08/2017  Post procedure Call Back phone  # [phone]  Permission to leave phone message Yes  Some recent data might be hidden     Patient questions:  Do you have a fever, pain , or abdominal swelling? No. Pain Score  0 *  Have you tolerated food without any problems? Yes.    Have you been able to return to your normal activities? Yes.    Do you have any questions about your discharge instructions: Diet   No. Medications  No. Follow up visit  No.  Do you have questions or concerns about your Care? No.  Actions: * If pain score is 4 or above: No action needed, pain <4.

## 2017-04-11 ENCOUNTER — Other Ambulatory Visit: Payer: Self-pay | Admitting: Internal Medicine

## 2017-04-15 ENCOUNTER — Encounter: Payer: Self-pay | Admitting: Internal Medicine

## 2017-06-18 DIAGNOSIS — L720 Epidermal cyst: Secondary | ICD-10-CM | POA: Diagnosis not present

## 2017-06-18 DIAGNOSIS — D485 Neoplasm of uncertain behavior of skin: Secondary | ICD-10-CM | POA: Diagnosis not present

## 2017-09-16 ENCOUNTER — Other Ambulatory Visit: Payer: Self-pay | Admitting: Internal Medicine

## 2017-09-17 ENCOUNTER — Other Ambulatory Visit: Payer: Self-pay | Admitting: Internal Medicine

## 2017-09-18 ENCOUNTER — Other Ambulatory Visit: Payer: Self-pay

## 2017-09-18 MED ORDER — METFORMIN HCL 500 MG PO TABS: ORAL_TABLET | ORAL | 0 refills | Status: DC

## 2017-09-24 ENCOUNTER — Telehealth: Payer: Self-pay | Admitting: Internal Medicine

## 2017-09-24 MED ORDER — METFORMIN HCL 500 MG PO TABS: ORAL_TABLET | ORAL | 0 refills | Status: DC

## 2017-09-24 NOTE — Telephone Encounter (Signed)
Pt. Request:  Glucophage 500mg  tablet  Change of pharmacy.     Last Office visit/  Tracy Turner  04/01/17  Last refill: 09/1717 #90 0 refill  Rx per pt. Request changed to [facility] mail service

## 2017-09-24 NOTE — Telephone Encounter (Signed)
Copied from [place] [id number]. Topic: Quick Communication - Rx Refill/Question >> Sep 24, 2017  8:44 AM Boyd Kerbs wrote: Medication:  metFORMIN (GLUCOPHAGE) 500 MG tablet This was called into [facility] and needs to go to [facility]  Has the patient contacted their pharmacy? Yes.   (Agent: If no, request that the patient contact the pharmacy for the refill.) (Agent: If yes, when and what did the pharmacy advise?)  Preferred Pharmacy (with phone number or street name):   [place], [place] [facility] [address] [place] [address] [place] [address] Phone: [phone] Fax: [phone]    Agent: Please be advised that RX refills may take up to 3 business days. We ask that you follow-up with your pharmacy.

## 2017-09-25 ENCOUNTER — Telehealth: Payer: Self-pay

## 2017-10-22 ENCOUNTER — Other Ambulatory Visit: Payer: Self-pay | Admitting: Internal Medicine

## 2017-10-23 NOTE — Telephone Encounter (Signed)
Spoke to pt and informed him that an office visit was needed for more refills. Pt scheduled an appointment and stated that he had enough Metformin to last until next apt.

## 2017-11-02 ENCOUNTER — Other Ambulatory Visit: Payer: Self-pay | Admitting: Internal Medicine

## 2017-11-19 ENCOUNTER — Ambulatory Visit: Payer: 59 | Admitting: Internal Medicine

## 2017-11-22 ENCOUNTER — Other Ambulatory Visit: Payer: Self-pay | Admitting: Internal Medicine

## 2017-11-23 ENCOUNTER — Ambulatory Visit: Payer: 59 | Admitting: Internal Medicine

## 2017-11-23 ENCOUNTER — Encounter: Payer: Self-pay | Admitting: Internal Medicine

## 2017-11-23 VITALS — BP 188/60 | HR 79 | Temp 97.8°F | Wt 202.2 lb

## 2017-11-23 DIAGNOSIS — E039 Hypothyroidism, unspecified: Secondary | ICD-10-CM | POA: Diagnosis not present

## 2017-11-23 DIAGNOSIS — E785 Hyperlipidemia, unspecified: Secondary | ICD-10-CM | POA: Diagnosis not present

## 2017-11-23 DIAGNOSIS — E119 Type 2 diabetes mellitus without complications: Secondary | ICD-10-CM

## 2017-11-23 LAB — POCT GLYCOSYLATED HEMOGLOBIN (HGB A1C): HEMOGLOBIN A1C: 6.3 % — AB (ref 4.0–5.6)

## 2017-11-23 MED ORDER — SIMVASTATIN 40 MG PO TABS: 40.0000 mg | ORAL_TABLET | Freq: Every evening | ORAL | 3 refills | Status: DC

## 2017-11-23 MED ORDER — METFORMIN HCL 500 MG PO TABS: ORAL_TABLET | ORAL | 4 refills | Status: DC

## 2017-11-23 MED ORDER — SILDENAFIL CITRATE 100 MG PO TABS: 50.0000 mg | ORAL_TABLET | ORAL | 6 refills | Status: DC | PRN

## 2017-11-23 MED ORDER — PANTOPRAZOLE SODIUM 40 MG PO TBEC: 40.0000 mg | DELAYED_RELEASE_TABLET | Freq: Every day | ORAL | 3 refills | Status: DC

## 2017-11-23 MED ORDER — LEVOTHYROXINE SODIUM 137 MCG PO TABS: 137.0000 ug | ORAL_TABLET | Freq: Every day | ORAL | 4 refills | Status: DC

## 2017-11-23 NOTE — Patient Instructions (Signed)
Limit your sodium (Salt) intake   Please check your hemoglobin A1c every 3-6  Months  Please see your eye doctor yearly to check for diabetic eye damage

## 2017-11-23 NOTE — Progress Notes (Signed)
Subjective:    Patient ID: Tracy Turner, male    DOB: 08-17-1955, 62 y.o.   MRN: [id number]  HPI  Lab Results  Component Value Date   HGBA1C 6.8 (H) 04/01/2017   62 year old patient who is seen today for follow-up of type 2 diabetes.  He is on metformin therapy alone. Still quite well no concerns or complaints.  He does have treated hypothyroidism. No cardiopulmonary complaints.  Remains active with golf which has been limited more recently however due to the poor health of elderly parents  Past Medical History:  Diagnosis Date  . COPD (chronic obstructive pulmonary disease) (HCC)    mild  . Diabetes mellitus without complication ([place])    more controlled A1C after 30 # weight lost  . GERD (gastroesophageal reflux disease)   . History of colonic polyps   . Hyperlipidemia   . Hypothyroidism   . Tobacco abuse      Social History   Socioeconomic History  . Marital status: Married    Spouse name: Not on file  . Number of children: Not on file  . Years of education: Not on file  . Highest education level: Not on file  Occupational History  . Not on file  Social Needs  . Financial resource strain: Not on file  . Food insecurity:    Worry: Not on file    Inability: Not on file  . Transportation needs:    Medical: Not on file    Non-medical: Not on file  Tobacco Use  . Smoking status: Current Every Day Smoker    Packs/day: 0.50    Types: Cigarettes    Last attempt to quit: 08/30/2012    Years since quitting: 5.2  . Smokeless tobacco: Never Used  . Tobacco comment: using Chantix...Marland Kitchen40 years on and off; 05-22-14 now smoking again < 1/2 ppd  Substance and Sexual Activity  . Alcohol use: Yes    Comment: occ. maybe every month   . Drug use: No  . Sexual activity: Yes  Lifestyle  . Physical activity:    Days per week: Not on file    Minutes per session: Not on file  . Stress: Not on file  Relationships  . Social connections:    Talks on phone: Not on file    Gets  together: Not on file    Attends religious service: Not on file    Active member of club or organization: Not on file    Attends meetings of clubs or organizations: Not on file    Relationship status: Not on file  . Intimate partner violence:    Fear of current or ex partner: Not on file    Emotionally abused: Not on file    Physically abused: Not on file    Forced sexual activity: Not on file  Other Topics Concern  . Not on file  Social History Narrative  . Not on file    Past Surgical History:  Procedure Laterality Date  . COLONOSCOPY  2008  . ETT  2007  . INGUINAL HERNIA REPAIR Bilateral 05/25/2014   Procedure: BILATERAL LAPAROSCOPIC INGUINAL HERNIA WITH MESH;  Surgeon: Michael Boston, MD;  Location: [facility];  Service: General;  Laterality: Bilateral;  . INSERTION OF MESH Bilateral 05/25/2014   Procedure: INSERTION OF MESH;  Surgeon: Michael Boston, MD;  Location: [facility];  Service: General;  Laterality: Bilateral;  . TOOTH EXTRACTION     several -molars    Family History  Problem Relation Age  of Onset  . Heart disease Father   . Healthy Sister   . Healthy Brother   . Heart disease Mother   . Dementia Mother   . Colon cancer Neg Hx     No Known Allergies  Current Outpatient Medications on File Prior to Visit  Medication Sig Dispense Refill  . aspirin EC 81 MG tablet Take 81 mg by mouth daily.    Marland Kitchen levothyroxine (SYNTHROID, LEVOTHROID) 137 MCG tablet TAKE 1 TABLET BY MOUTH  DAILY 90 tablet 2  . metFORMIN (GLUCOPHAGE) 500 MG tablet TAKE 1 TABLET BY MOUTH TWO  TIMES DAILY WITH MEALS 90 tablet 0  . Multiple Vitamins-Minerals (MENS 50+ MULTI VITAMIN/MIN PO) Take 1 tablet by mouth daily.    . pantoprazole (PROTONIX) 40 MG tablet TAKE 1 TABLET BY MOUTH  DAILY 90 tablet 3  . sildenafil (VIAGRA) 100 MG tablet Take 0.5 tablets (50 mg total) by mouth daily as needed for erectile dysfunction. 18 tablet 1  . sildenafil (VIAGRA) 100 MG tablet Take 0.5 tablets (50 mg total) by mouth as  needed for erectile dysfunction. 6 tablet 6  . simvastatin (ZOCOR) 40 MG tablet TAKE 1 TABLET BY MOUTH  EVERY EVENING 90 tablet 3  . varenicline (CHANTIX CONTINUING MONTH PAK) 1 MG tablet Take 1 tablet (1 mg total) by mouth 2 (two) times daily. 60 tablet 2  . varenicline (CHANTIX STARTING MONTH PAK) 0.5 MG X 11 & 1 MG X 42 tablet Take one 0.5 mg tablet by mouth once daily for 3 days, then increase to one 0.5 mg tablet twice daily for 4 days, then increase to one 1 mg tablet twice daily. 53 tablet 0   No current facility-administered medications on file prior to visit.     BP (!) 188/60 (BP Location: Left Arm, Patient Position: Lying right side, Cuff Size: Normal)   Pulse 79   Temp 97.8 F (36.6 C) (Oral)   Wt 202 lb 3.2 oz (91.7 kg)   SpO2 98%   BMI 27.42 kg/m      Review of Systems  Constitutional: Negative for appetite change, chills, fatigue and fever.  HENT: Negative for congestion, dental problem, ear pain, hearing loss, sore throat, tinnitus, trouble swallowing and voice change.   Eyes: Negative for pain, discharge and visual disturbance.  Respiratory: Negative for cough, chest tightness, wheezing and stridor.   Cardiovascular: Negative for chest pain, palpitations and leg swelling.  Gastrointestinal: Negative for abdominal distention, abdominal pain, blood in stool, constipation, diarrhea, nausea and vomiting.  Genitourinary: Negative for difficulty urinating, discharge, flank pain, genital sores, hematuria and urgency.  Musculoskeletal: Negative for arthralgias, back pain, gait problem, joint swelling, myalgias and neck stiffness.  Skin: Negative for rash.  Neurological: Negative for dizziness, syncope, speech difficulty, weakness, numbness and headaches.  Hematological: Negative for adenopathy. Does not bruise/bleed easily.  Psychiatric/Behavioral: Negative for behavioral problems and dysphoric mood. The patient is not nervous/anxious.        Objective:   Physical Exam    Constitutional: He is oriented to person, place, and time. He appears well-developed.  Blood pressure low normal  HENT:  Head: Normocephalic.  Right Ear: External ear normal.  Left Ear: External ear normal.  Eyes: Conjunctivae and EOM are normal.  Neck: Normal range of motion.  Cardiovascular: Normal rate and normal heart sounds.  Pulmonary/Chest: Breath sounds normal.  Abdominal: Bowel sounds are normal.  Musculoskeletal: Normal range of motion. He exhibits no edema or tenderness.  Neurological: He is alert and  oriented to person, place, and time.  Psychiatric: He has a normal mood and affect. His behavior is normal.          Assessment & Plan:   Diabetes mellitus type 2 Hypothyroidism Dyslipidemia.  Continue statin therapy  Annual eye examination encouraged CPX with new provider in 6 months Medications updated  Marletta Lor

## 2017-11-26 ENCOUNTER — Other Ambulatory Visit: Payer: Self-pay

## 2017-11-27 NOTE — Telephone Encounter (Signed)
Entered chart in error.

## 2017-11-30 ENCOUNTER — Other Ambulatory Visit: Payer: Self-pay

## 2017-11-30 ENCOUNTER — Telehealth: Payer: Self-pay | Admitting: Family Medicine

## 2017-11-30 MED ORDER — SILDENAFIL CITRATE 100 MG PO TABS: 50.0000 mg | ORAL_TABLET | ORAL | 6 refills | Status: DC | PRN

## 2017-11-30 NOTE — Telephone Encounter (Signed)
Copied from [place] [id number]. Topic: General - Other >> Nov 30, 2017  8:31 AM Yvette Rack wrote: Reason for CRM: pt calling stating that he had spoken with the [place]  three times last week and they say that they never received the sildenafil (VIAGRA) 100 MG tablet  but they received the rest of the pt medicines please resend to the mail service

## 2017-11-30 NOTE — Telephone Encounter (Signed)
Rx resent. Will phone in Rx 12/01/2017 if not received!

## 2018-04-25 ENCOUNTER — Other Ambulatory Visit: Payer: Self-pay | Admitting: Internal Medicine

## 2018-05-06 ENCOUNTER — Encounter: Payer: Self-pay | Admitting: Family Medicine

## 2018-05-06 ENCOUNTER — Ambulatory Visit (INDEPENDENT_AMBULATORY_CARE_PROVIDER_SITE_OTHER): Payer: 59 | Admitting: Family Medicine

## 2018-05-06 VITALS — BP 134/82 | HR 72 | Temp 97.8°F | Ht 71.0 in | Wt 199.8 lb

## 2018-05-06 DIAGNOSIS — F17200 Nicotine dependence, unspecified, uncomplicated: Secondary | ICD-10-CM

## 2018-05-06 DIAGNOSIS — Z0000 Encounter for general adult medical examination without abnormal findings: Secondary | ICD-10-CM

## 2018-05-06 DIAGNOSIS — E039 Hypothyroidism, unspecified: Secondary | ICD-10-CM | POA: Diagnosis not present

## 2018-05-06 DIAGNOSIS — Z23 Encounter for immunization: Secondary | ICD-10-CM

## 2018-05-06 DIAGNOSIS — Z125 Encounter for screening for malignant neoplasm of prostate: Secondary | ICD-10-CM | POA: Diagnosis not present

## 2018-05-06 DIAGNOSIS — E785 Hyperlipidemia, unspecified: Secondary | ICD-10-CM

## 2018-05-06 DIAGNOSIS — Z6827 Body mass index (BMI) 27.0-27.9, adult: Secondary | ICD-10-CM

## 2018-05-06 DIAGNOSIS — E119 Type 2 diabetes mellitus without complications: Secondary | ICD-10-CM | POA: Diagnosis not present

## 2018-05-06 DIAGNOSIS — E349 Endocrine disorder, unspecified: Secondary | ICD-10-CM

## 2018-05-06 DIAGNOSIS — Z79899 Other long term (current) drug therapy: Secondary | ICD-10-CM | POA: Diagnosis not present

## 2018-05-06 DIAGNOSIS — K219 Gastro-esophageal reflux disease without esophagitis: Secondary | ICD-10-CM

## 2018-05-06 DIAGNOSIS — J449 Chronic obstructive pulmonary disease, unspecified: Secondary | ICD-10-CM

## 2018-05-06 LAB — COMPREHENSIVE METABOLIC PANEL
ALBUMIN: 4.5 g/dL (ref 3.5–5.2)
ALT: 18 U/L (ref 0–53)
AST: 13 U/L (ref 0–37)
Alkaline Phosphatase: 44 U/L (ref 39–117)
BUN: 15 mg/dL (ref 6–23)
CHLORIDE: 104 meq/L (ref 96–112)
CO2: 28 mEq/L (ref 19–32)
Calcium: 9.9 mg/dL (ref 8.4–10.5)
Creatinine, Ser: 1.11 mg/dL (ref 0.40–1.50)
GFR: 71.35 mL/min (ref >60.00)
Glucose, Bld: 103 mg/dL — ABNORMAL HIGH (ref 70–99)
Potassium: 4.3 mEq/L (ref 3.5–5.1)
Sodium: 139 mEq/L (ref 135–145)
Total Bilirubin: 0.5 mg/dL (ref 0.2–1.2)
Total Protein: 6.8 g/dL (ref 6.0–8.3)

## 2018-05-06 LAB — VITAMIN B12: Vitamin B-12: 718 pg/mL (ref 211–911)

## 2018-05-06 LAB — CBC
HCT: 48 % (ref 39.0–52.0)
Hemoglobin: 15.9 g/dL (ref 13.0–17.0)
MCHC: 33.2 g/dL (ref 30.0–36.0)
MCV: 93.2 fl (ref 78.0–100.0)
Platelets: 234 10*3/uL (ref 150.0–400.0)
RBC: 5.15 Mil/uL (ref 4.22–5.81)
RDW: 13.6 % (ref 11.5–15.5)
WBC: 6.4 10*3/uL (ref 4.0–10.5)

## 2018-05-06 LAB — LIPID PANEL
Cholesterol: 149 mg/dL (ref 0–200)
HDL: 38.3 mg/dL — ABNORMAL LOW (ref >39.00)
LDL Cholesterol: 76 mg/dL (ref 0–99)
NonHDL: 110.29
Total CHOL/HDL Ratio: 4
Triglycerides: 171 mg/dL — ABNORMAL HIGH (ref 0.0–149.0)
VLDL: 34.2 mg/dL (ref 0.0–40.0)

## 2018-05-06 LAB — MICROALBUMIN / CREATININE URINE RATIO
Creatinine,U: 128.9 mg/dL
Microalb Creat Ratio: 0.5 mg/g (ref 0.0–30.0)
Microalb, Ur: <0.7 mg/dL (ref 0.0–1.9)

## 2018-05-06 LAB — TSH: TSH: 4.35 u[IU]/mL (ref 0.35–4.50)

## 2018-05-06 LAB — HEMOGLOBIN A1C: Hgb A1c MFr Bld: 6.5 % (ref 4.6–6.5)

## 2018-05-06 LAB — PSA: PSA: 1.12 ng/mL (ref 0.10–4.00)

## 2018-05-06 NOTE — Assessment & Plan Note (Signed)
chantix 2007 quit for 3-4 years and started back. May want to retrial when life in more stable position- mom currently ill January 2020

## 2018-05-06 NOTE — Addendum Note (Signed)
Addended by: Lyndle Herrlich on: 05/06/2018 08:44 AM   Modules accepted: Orders

## 2018-05-06 NOTE — Assessment & Plan Note (Signed)
Mild poorly controlled on last check- patient is on simvastatin 40 mg-update lipids today and will push LDL goal under 100.  He is on aspirin as well for primary prevention.

## 2018-05-06 NOTE — Assessment & Plan Note (Signed)
Hypothyroidism- has been controlled on levothyroxine 137 mcg-update TSH today

## 2018-05-06 NOTE — Progress Notes (Signed)
Phone: [phone]  Subjective:  Patient presents today for their annual physical. Chief complaint-noted.   See problem oriented charting- ROS- full  review of systems was completed and negative except for: some intermittent cough  The following were reviewed and entered/updated in epic: Past Medical History:  Diagnosis Date  . COPD (chronic obstructive pulmonary disease) (HCC)    mild  . Diabetes mellitus without complication ([place])    more controlled A1C after 30 # weight lost  . GERD (gastroesophageal reflux disease)   . History of colonic polyps   . Hyperlipidemia   . Hypothyroidism   . NONGONOCOCCAL URETHRITIS 10/12/2009   Qualifier: Diagnosis of  By: Burnice Logan  MD, Doretha Sou   . Tobacco abuse    Patient Active Problem List   Diagnosis Date Noted  . Smoker 05/06/2018    Priority: High  . COPD (chronic obstructive pulmonary disease) ([place])     Priority: High  . Diabetes mellitus without complication ([place]) 67/20/9470    Priority: High  . Hypothyroidism 07/20/2009    Priority: Medium  . Dyslipidemia 07/20/2009    Priority: Medium  . Testosterone deficiency 08/22/2013    Priority: Low  . GERD 01/21/2010    Priority: Low   Past Surgical History:  Procedure Laterality Date  . COLONOSCOPY  2008  . ETT  2007  . INGUINAL HERNIA REPAIR Bilateral 05/25/2014   Procedure: BILATERAL LAPAROSCOPIC INGUINAL HERNIA WITH MESH;  Surgeon: Michael Boston, MD;  Location: [facility];  Service: General;  Laterality: Bilateral;  . INSERTION OF MESH Bilateral 05/25/2014   Procedure: INSERTION OF MESH;  Surgeon: Michael Boston, MD;  Location: [facility];  Service: General;  Laterality: Bilateral;  . TOOTH EXTRACTION     several -molars    Family History  Problem Relation Age of Onset  . Heart disease Father   . Stroke Father   . Hyperlipidemia Father   . Healthy Sister   . Healthy Brother   . Heart disease Mother   . Dementia Mother        on hospice early 2020  . Colon cancer Neg Hx      Medications- reviewed and updated Current Outpatient Medications  Medication Sig Dispense Refill  . aspirin EC 81 MG tablet Take 81 mg by mouth daily.    Marland Kitchen levothyroxine (SYNTHROID, LEVOTHROID) 137 MCG tablet Take 1 tablet (137 mcg total) by mouth daily. 90 tablet 4  . metFORMIN (GLUCOPHAGE) 500 MG tablet 1 tablet by mouth twice daily 90 tablet 4  . Multiple Vitamins-Minerals (MENS 50+ MULTI VITAMIN/MIN PO) Take 1 tablet by mouth daily.    . pantoprazole (PROTONIX) 40 MG tablet Take 1 tablet (40 mg total) by mouth daily. 90 tablet 3  . sildenafil (VIAGRA) 100 MG tablet Take 0.5 tablets (50 mg total) by mouth as needed for erectile dysfunction. 6 tablet 6  . simvastatin (ZOCOR) 40 MG tablet Take 1 tablet (40 mg total) by mouth every evening. 90 tablet 3   No current facility-administered medications for this visit.     Allergies-reviewed and updated No Known Allergies  Social History   Social History Narrative   Married. 3 children. 1 grandchild and 1 on the way in [place].       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    Also does landscaping as a side business      Hobbies: golf    Objective: BP 134/82 (BP Location: Left Arm, Patient Position: Sitting, Cuff Size: Normal)   Pulse 72   Temp 97.8  F (36.6 C) (Oral)   Ht 5\' 11"  (1.803 m)   Wt 199 lb 12.8 oz (90.6 kg)   SpO2 98%   BMI 27.87 kg/m  Gen: NAD, resting comfortably HEENT: Mucous membranes are moist. Oropharynx normal Neck: no thyromegaly CV: RRR no murmurs rubs or gallops Lungs: CTAB no crackles, wheeze, rhonchi Abdomen: soft/nontender/nondistended/normal bowel sounds. No rebound or guarding.  Ext: no edema Skin: warm, dry Neuro: grossly normal, moves all extremities, PERRLA  Diabetic Foot Exam - Simple   Simple Foot Form Diabetic Foot exam was performed with the following findings:  Yes 05/06/2018  8:22 AM  Visual Inspection No deformities, no ulcerations, no other skin breakdown bilaterally:  Yes Sensation  Testing Intact to touch and monofilament testing bilaterally:  Yes Pulse Check Posterior Tibialis and Dorsalis pulse intact bilaterally:  Yes Comments    Assessment/Plan:  63 y.o. male presenting for annual physical.  Health Maintenance counseling: 1. Anticipatory guidance: Patient counseled regarding regular dental exams -q6 months advised- he needs to get in, eye exams - advised yearly,  avoiding smoking and second hand smoke (see smoking section below), limiting alcohol to 2 beverages per day - he drinks rarely.   2. Risk factor reduction:  Advised patient of need for regular exercise and diet rich and fruits and vegetables to reduce risk of heart attack and stroke. Exercise- landscaping as side business so is very active come march-- encouraged him to find something in winter months. Diet-patient has lost weight down from 220 to 230 range by cutting out/down on sugars- hasnt been watching as well lately though.  Wt Readings from Last 3 Encounters:  05/06/18 199 lb 12.8 oz (90.6 kg)  11/23/17 202 lb 3.2 oz (91.7 kg)  04/08/17 211 lb (95.7 kg)  3. Immunizations/screenings/ancillary studies-influenza vaccination and Pneumovax 23 recommended today- likely he already had influenza vaccination.  In regards to Pneumovax- he opts in.  We also discussed Shingrix- declines for now. Immunization History  Administered Date(s) Administered  . Influenza Split 03/06/2011  . Influenza,inj,Quad PF,6+ Mos 04/04/2013, 04/01/2017  . Influenza-Unspecified 02/02/2014, 02/27/2015, 02/07/2016, 02/16/2018  . Tdap 07/29/2013   Health Maintenance Due  Topic Date Due  . OPHTHALMOLOGY EXAM - advised him to update this at his local eye doctor  05/16/1966  . HIV Screening - donates blood to [facility] cross 05/17/1971  . FOOT EXAM - today  04/01/2018   4. Prostate cancer screening- prior low risk PSA trend. Defer rectal unless PSA trend concerning  Lab Results  Component Value Date   PSA 0.95 04/01/2017   PSA 0.79  11/19/2015   PSA 0.79 11/23/2014   5. Colon cancer screening - 04/08/17 with 10 year follow up. Had benign polyp only.  6. Skin cancer screening- sees dermatologist yearly in [facility]. advised regular sunscreen use. Denies worrisome, changing, or new skin lesions.  7.  Current smoker-half pack per day.- encouraged cessation hes not ready  -Discussed potential lung cancer screening- he is under 20 pack years though - Discussed AAA screen at 25 -Prior trial of Chantix- may call in for retrial   Status of chronic or acute concerns   Dyslipidemia Mild poorly controlled on last check- patient is on simvastatin 40 mg-update lipids today and will push LDL goal under 100.  He is on aspirin as well for primary prevention.  Hypothyroidism Hypothyroidism- has been controlled on levothyroxine 137 mcg-update TSH today  Diabetes mellitus without complication (HCC) Diabetes- has been controlled on metformin 500 mg twice a day.  We will obtain an A1c today -Update urine microalbumin today - Update foot exam today  -Need updated copy of eye exam   Smoker chantix 2007 quit for 3-4 years and started back. May want to retrial when life in more stable position- mom currently ill January 2020  Mild COPD diagnosed about 15 years ago- patient largely asymptomatic but does have some intermittent cough.  If cough progresses or develops shortness of breath-consider PFTs and chest x-ray  71-month follow-up-at least until we get him to quit smoking Check in on patient about how his mother is doing at next visit  Lab/Order associations:fasting  Preventative health care - Plan: Hemoglobin A1c, Microalbumin / creatinine urine ratio, CBC, Comprehensive metabolic panel, Lipid panel, PSA, TSH, Vitamin B12  Chronic obstructive pulmonary disease, unspecified COPD type (HCC)  Dyslipidemia  Acquired hypothyroidism - Plan: TSH  Diabetes mellitus without complication ([place]) - Plan: Hemoglobin A1c, Microalbumin /  creatinine urine ratio, CBC, Comprehensive metabolic panel, Lipid panel  BMI 27.0-27.9,adult  Screening for prostate cancer - Plan: PSA  Smoker  High risk medication use - Plan: Vitamin B12  Gastroesophageal reflux disease without esophagitis  Testosterone deficiency  Return precautions advised.  Garret Reddish, MD

## 2018-05-06 NOTE — Patient Instructions (Addendum)
Health Maintenance Due  Topic Date Due  . PNEUMOCOCCAL POLYSACCHARIDE VACCINE AGE 62-64 HIGH RISK - we will give this today and repeat in 5 years 05/16/1958  . OPHTHALMOLOGY EXAM - please see an eye doctor and have them send [place] a report of your diabetic eye exam- we recommend those on a yearly basis 05/16/1966  . FOOT EXAM - today 04/01/2018   Consider shingrix next year  Please stop by lab before you go

## 2018-05-06 NOTE — Assessment & Plan Note (Signed)
Diabetes- has been controlled on metformin 500 mg twice a day.  We will obtain an A1c today -Update urine microalbumin today - Update foot exam today  -Need updated copy of eye exam

## 2018-06-01 ENCOUNTER — Other Ambulatory Visit: Payer: Self-pay

## 2018-06-01 MED ORDER — METFORMIN HCL 500 MG PO TABS: ORAL_TABLET | ORAL | 3 refills | Status: DC

## 2018-11-01 ENCOUNTER — Encounter: Payer: Self-pay | Admitting: Family Medicine

## 2018-11-01 ENCOUNTER — Ambulatory Visit (INDEPENDENT_AMBULATORY_CARE_PROVIDER_SITE_OTHER): Payer: 59 | Admitting: Family Medicine

## 2018-11-01 VITALS — Wt 188.0 lb

## 2018-11-01 DIAGNOSIS — E039 Hypothyroidism, unspecified: Secondary | ICD-10-CM | POA: Diagnosis not present

## 2018-11-01 DIAGNOSIS — M545 Low back pain, unspecified: Secondary | ICD-10-CM

## 2018-11-01 DIAGNOSIS — E785 Hyperlipidemia, unspecified: Secondary | ICD-10-CM

## 2018-11-01 DIAGNOSIS — E119 Type 2 diabetes mellitus without complications: Secondary | ICD-10-CM

## 2018-11-01 DIAGNOSIS — M898X1 Other specified disorders of bone, shoulder: Secondary | ICD-10-CM

## 2018-11-01 DIAGNOSIS — E1169 Type 2 diabetes mellitus with other specified complication: Secondary | ICD-10-CM | POA: Diagnosis not present

## 2018-11-01 NOTE — Patient Instructions (Signed)
Health Maintenance Due  Topic Date Due  . OPHTHALMOLOGY EXAM  05/16/1966    Depression screen [facility] 2/9 11/01/2018 05/06/2018 11/26/2015  Decreased Interest 0 0 0  Down, Depressed, Hopeless 0 0 0  PHQ - 2 Score 0 0 0

## 2018-11-01 NOTE — Progress Notes (Signed)
Phone [phone]   Subjective:  Virtual visit via Video note. Chief complaint: Chief Complaint  Patient presents with  . Follow-up  . Back Pain    Muscle spasm off and on for many years     This visit type was conducted due to national recommendations for restrictions regarding the COVID-19 Pandemic (e.g. social distancing).  This format is felt to be most appropriate for this patient at this time balancing risks to patient and risks to population by having him in for in person visit.  No physical exam was performed (except for noted visual exam or audio findings with Telehealth visits).    Our team/I connected with Tracy Turner at  8:40 AM EDT by a video enabled telemedicine application (doxy.me or caregility through epic) and verified that I am speaking with the correct person using two identifiers.  Location patient: Home[place] Location provider: [facility], office Persons participating in the virtual visit:  patient  Our team/I discussed the limitations of evaluation and management by telemedicine and the availability of in person appointments. In light of current covid-19 pandemic, patient also understands that we are trying to protect them by minimizing in office contact if at all possible.  The patient expressed consent for telemedicine visit and agreed to proceed. Patient understands insurance will be billed.   ROS- No fever, chills, cough, congestion, runny nose, shortness of breath, fatigue, body aches, sore throat, headache, nausea, vomiting, diarrhea, or new loss of taste or smell. No known contacts with covid 19 or someone being tested for covid 19.   Past Medical History-  Patient Active Problem List   Diagnosis Date Noted  . Smoker 05/06/2018    Priority: High  . COPD (chronic obstructive pulmonary disease) ([place])     Priority: High  . Diabetes mellitus without complication ([place]) 99/24/2683    Priority: High  . Hypothyroidism 07/20/2009    Priority: Medium  .  Dyslipidemia 07/20/2009    Priority: Medium  . Testosterone deficiency 08/22/2013    Priority: Low  . GERD 01/21/2010    Priority: Low    Medications- reviewed and updated Current Outpatient Medications  Medication Sig Dispense Refill  . aspirin EC 81 MG tablet Take 81 mg by mouth daily.    Marland Kitchen levothyroxine (SYNTHROID, LEVOTHROID) 137 MCG tablet Take 1 tablet (137 mcg total) by mouth daily. 90 tablet 4  . metFORMIN (GLUCOPHAGE) 500 MG tablet 1 tablet by mouth twice daily 90 tablet 3  . Multiple Vitamins-Minerals (MENS 50+ MULTI VITAMIN/MIN PO) Take 1 tablet by mouth daily.    . pantoprazole (PROTONIX) 40 MG tablet Take 1 tablet (40 mg total) by mouth daily. 90 tablet 3  . sildenafil (VIAGRA) 100 MG tablet Take 0.5 tablets (50 mg total) by mouth as needed for erectile dysfunction. 6 tablet 6  . simvastatin (ZOCOR) 40 MG tablet Take 1 tablet (40 mg total) by mouth every evening. 90 tablet 3   No current facility-administered medications for this visit.      Objective:  Wt 188 lb (85.3 kg)   BMI 26.22 kg/m  self reported vitals Gen: NAD, resting comfortably Lungs: nonlabored, normal respiratory rate  Skin: appears dry, no obvious rash     Assessment and Plan   # Low back pain/right shoulder blade pain S:patient with continued issues with back pain- gets knot under right shoulder blade. Pain leve at its worst is 4/10 annoying pain- affects his golf game. Right handed. Has been going on for years. Tylenol seems to help  somewhat. Dad has same pain in same issue. Is able to fall asleep.  Hurt lower back in high school- gets intermittent issues there with pain in low back if turns the wrong way.   A/P: low back pain for years- suspect some arthritis. Right shoulder blade pain- could be referred from back- get x-rays.   - lumbar and thoracic films - he will call to schedule these -Has a cyst on his neck that dermatology wants removed by plastic surgery and he asks if this could be  causing the pain underneath his shoulder blades-I told him I did not think it was likely but we can always get Dr. 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 Good energy levels- no weight changes other than weight loss working in yard more- he has low concern.  Lab Results  Component Value Date   TSH 4.35 05/06/2018  A/P: Stable. Continue current medications.    #hyperlipidemia S: Reasonably controlled on simvastatin 40 mg Lab Results  Component Value Date   CHOL 149 05/06/2018   HDL 38.30 (L) 05/06/2018   LDLCALC 76 05/06/2018   LDLDIRECT 80.8 07/20/2012   TRIG 171.0 (H) 05/06/2018   CHOLHDL 4 05/06/2018   A/P:  Stable. Continue current medications.    # Smoker S: smoking 1 pack per day. Intermittent cough last visit now resolved  A/P: Encouraged cessation-he is not ready to quit  #Social update- mother was on hospice but died from [place] 38 in rehab- apparently the nurse caring for her at covid-19  He will call to schedule labs and x-rays- COVID-19 screen negative today as per review of systems Lab/Order associations:   ICD-10-CM   1. Diabetes mellitus without complication (HCC)  A41.6 Hemoglobin A1c    Comprehensive metabolic panel    LDL cholesterol, direct  2. Hyperlipidemia associated with type 2 diabetes mellitus ([place])  E11.69    E78.5   3.  Acquired hypothyroidism  E03.9 TSH  4. Bilateral low back pain without sciatica, unspecified chronicity  M54.5 DG Lumbar Spine Complete  5. Shoulder blade pain  M89.8X1 DG Thoracic Spine W/Swimmers   Return precautions advised.  Garret Reddish, MD

## 2018-11-03 ENCOUNTER — Other Ambulatory Visit: Payer: Self-pay

## 2018-11-03 MED ORDER — PANTOPRAZOLE SODIUM 40 MG PO TBEC: 40.0000 mg | DELAYED_RELEASE_TABLET | Freq: Every day | ORAL | 1 refills | Status: DC

## 2018-11-03 MED ORDER — SIMVASTATIN 40 MG PO TABS: 40.0000 mg | ORAL_TABLET | Freq: Every evening | ORAL | 1 refills | Status: DC

## 2018-11-03 NOTE — Telephone Encounter (Signed)
Last OV 11/01/2018 Last refill 11/23/17 #90/3 Previously filled by Dr. Nonie Hoyer.   Forwarding to Dr. Yong Channel.

## 2018-11-08 ENCOUNTER — Other Ambulatory Visit: Payer: Self-pay

## 2018-11-08 ENCOUNTER — Other Ambulatory Visit (INDEPENDENT_AMBULATORY_CARE_PROVIDER_SITE_OTHER): Payer: 59

## 2018-11-08 DIAGNOSIS — E039 Hypothyroidism, unspecified: Secondary | ICD-10-CM

## 2018-11-08 DIAGNOSIS — E119 Type 2 diabetes mellitus without complications: Secondary | ICD-10-CM | POA: Diagnosis not present

## 2018-11-08 LAB — TSH: TSH: 1.07 u[IU]/mL (ref 0.35–4.50)

## 2018-11-08 LAB — COMPREHENSIVE METABOLIC PANEL
ALT: 17 U/L (ref 0–53)
AST: 11 U/L (ref 0–37)
Albumin: 4.3 g/dL (ref 3.5–5.2)
Alkaline Phosphatase: 53 U/L (ref 39–117)
BUN: 17 mg/dL (ref 6–23)
CO2: 29 mEq/L (ref 19–32)
Calcium: 9.3 mg/dL (ref 8.4–10.5)
Chloride: 104 mEq/L (ref 96–112)
Creatinine, Ser: 1.09 mg/dL (ref 0.40–1.50)
GFR: 68.44 mL/min (ref >60.00)
Glucose, Bld: 168 mg/dL — ABNORMAL HIGH (ref 70–99)
Potassium: 4.5 mEq/L (ref 3.5–5.1)
Sodium: 140 mEq/L (ref 135–145)
Total Bilirubin: 0.5 mg/dL (ref 0.2–1.2)
Total Protein: 6.6 g/dL (ref 6.0–8.3)

## 2018-11-08 LAB — HEMOGLOBIN A1C: Hgb A1c MFr Bld: 6.4 % (ref 4.6–6.5)

## 2018-11-08 LAB — LDL CHOLESTEROL, DIRECT: Direct LDL: 81 mg/dL

## 2018-11-17 LAB — HM DIABETES EYE EXAM

## 2018-11-24 ENCOUNTER — Encounter: Payer: Self-pay | Admitting: Family Medicine

## 2018-11-25 ENCOUNTER — Other Ambulatory Visit: Payer: Self-pay | Admitting: Family Medicine

## 2019-01-19 ENCOUNTER — Other Ambulatory Visit: Payer: Self-pay | Admitting: Family Medicine

## 2019-01-19 MED ORDER — LEVOTHYROXINE SODIUM 137 MCG PO TABS: 137.0000 ug | ORAL_TABLET | Freq: Every day | ORAL | 1 refills | Status: DC

## 2019-01-19 NOTE — Telephone Encounter (Signed)
Requested medication (s) are due for refill today: yes  Requested medication (s) are on the active medication list: yes Future visit scheduled: no  Notes to clinic: review for refill   Requested Prescriptions  Pending Prescriptions Disp Refills   levothyroxine (SYNTHROID) 137 MCG tablet 90 tablet 1    Sig: Take 1 tablet (137 mcg total) by mouth daily.     Endocrinology:  Hypothyroid Agents Failed - 01/19/2019 12:32 PM      Failed - TSH needs to be rechecked within 3 months after an abnormal result. Refill until TSH is due.      Passed - TSH in normal range and within 360 days    TSH  Date Value Ref Range Status  11/08/2018 1.07 0.35 - 4.50 uIU/mL Final         Passed - Valid encounter within last 12 months    Recent Outpatient Visits          2 months ago Diabetes mellitus without complication [facility])   [place], Brayton Mars, MD   8 months ago Preventative health care   [place] [facility], Brayton Mars, MD   1 year ago Diabetes mellitus without complication [facility])   [place] [facility], Doretha Sou, MD   1 year ago Need for influenza vaccination   [place] at [place] [facility], MD   2 years ago Hypothyroidism, unspecified type   Therapist, music at [employer], Doretha Sou, MD

## 2019-01-19 NOTE — Telephone Encounter (Signed)
See note

## 2019-01-19 NOTE — Telephone Encounter (Signed)
Medication: levothyroxine (SYNTHROID, LEVOTHROID) 137 MCG tablet     Patient is requesting a refill of this medication.    Pharmacy: [place], [place] [facility] [phone] (Phone) [phone] (Fax)

## 2019-02-07 ENCOUNTER — Other Ambulatory Visit: Payer: Self-pay

## 2019-02-07 ENCOUNTER — Telehealth: Payer: Self-pay | Admitting: Family Medicine

## 2019-02-07 MED ORDER — SILDENAFIL CITRATE 100 MG PO TABS: 50.0000 mg | ORAL_TABLET | ORAL | 6 refills | Status: DC | PRN

## 2019-02-07 NOTE — Telephone Encounter (Signed)
°  LAST APPOINTMENT DATE: 11/01/18 NEXT APPOINTMENT DATE:  MEDICATION:sildenafil (VIAGRA) 100 MG tablet  PHARMACY:[facility]  **Let patient know to contact pharmacy at the end of the day to make sure medication is ready. **  ** Please notify patient to allow 48-72 hours to process**  **Encourage patient to contact the pharmacy for refills or they can request refills through [facility]**  [place]:   LAST REFILL:   QTY: 6  REFILL DATE: 10/19    OTHER COMMENTS:    Okay for refill?  Please advise

## 2019-02-07 NOTE — Telephone Encounter (Signed)
Medication refilled

## 2019-04-16 ENCOUNTER — Other Ambulatory Visit: Payer: Self-pay | Admitting: Family Medicine

## 2019-05-11 ENCOUNTER — Other Ambulatory Visit: Payer: Self-pay | Admitting: Family Medicine

## 2019-06-22 ENCOUNTER — Other Ambulatory Visit: Payer: Self-pay | Admitting: Family Medicine

## 2019-09-11 ENCOUNTER — Other Ambulatory Visit: Payer: Self-pay | Admitting: Family Medicine

## 2019-09-27 ENCOUNTER — Other Ambulatory Visit: Payer: Self-pay | Admitting: Family Medicine

## 2020-03-26 ENCOUNTER — Encounter: Payer: Self-pay | Admitting: Family Medicine

## 2020-03-27 ENCOUNTER — Other Ambulatory Visit: Payer: Self-pay

## 2020-03-27 MED ORDER — METFORMIN HCL 500 MG PO TABS
ORAL_TABLET | ORAL | 3 refills | Status: DC
Start: 2020-03-27 — End: 2021-05-08

## 2020-03-27 MED ORDER — SILDENAFIL CITRATE 100 MG PO TABS: 50.0000 mg | ORAL_TABLET | ORAL | 6 refills | Status: DC | PRN

## 2020-03-27 MED ORDER — LEVOTHYROXINE SODIUM 137 MCG PO TABS
137.0000 ug | ORAL_TABLET | Freq: Every day | ORAL | 3 refills | Status: DC
Start: 2020-03-27 — End: 2020-05-28

## 2020-03-27 MED ORDER — SIMVASTATIN 40 MG PO TABS
40.0000 mg | ORAL_TABLET | Freq: Every evening | ORAL | 3 refills | Status: DC
Start: 2020-03-27 — End: 2021-05-08

## 2020-03-27 MED ORDER — PANTOPRAZOLE SODIUM 40 MG PO TBEC
40.0000 mg | DELAYED_RELEASE_TABLET | Freq: Every day | ORAL | 3 refills | Status: DC
Start: 2020-03-27 — End: 2021-05-08

## 2020-03-27 NOTE — Telephone Encounter (Signed)
Patient is calling in wanting to let us know that he would like to come pick this up today if possible.

## 2020-05-24 NOTE — Progress Notes (Signed)
Phone: [phone]   Subjective:  Patient presents today for their annual physical. Chief complaint-noted.   See problem oriented charting- ROS- full  review of systems was completed and negative  except for: occasional numbness on top of right foot, back pain at times, muscle aches at times  The following were reviewed and entered/updated in epic: Past Medical History:  Diagnosis Date  . COPD (chronic obstructive pulmonary disease) (HCC)    mild  . Diabetes mellitus without complication ([place])    more controlled A1C after 30 # weight lost  . GERD (gastroesophageal reflux disease)   . History of colonic polyps   . Hyperlipidemia   . Hypothyroidism   . NONGONOCOCCAL URETHRITIS 10/12/2009   Qualifier: Diagnosis of  By: Burnice Logan  MD, Doretha Sou   . Tobacco abuse    Patient Active Problem List   Diagnosis Date Noted  . Smoker 05/06/2018    Priority: High  . COPD (chronic obstructive pulmonary disease) ([place])     Priority: High  . Diabetes mellitus without complication ([place]) 19/62/2297    Priority: High  . Hypothyroidism 07/20/2009    Priority: Medium  . Dyslipidemia 07/20/2009    Priority: Medium  . Testosterone deficiency 08/22/2013    Priority: Low  . GERD 01/21/2010    Priority: Low   Past Surgical History:  Procedure Laterality Date  . COLONOSCOPY  2008  . ETT  2007  . INGUINAL HERNIA REPAIR Bilateral 05/25/2014   Procedure: BILATERAL LAPAROSCOPIC INGUINAL HERNIA WITH MESH;  Surgeon: Michael Boston, MD;  Location: [facility];  Service: General;  Laterality: Bilateral;  . INSERTION OF MESH Bilateral 05/25/2014   Procedure: INSERTION OF MESH;  Surgeon: Michael Boston, MD;  Location: [facility];  Service: General;  Laterality: Bilateral;  . TOOTH EXTRACTION     several -molars    Family History  Problem Relation Age of Onset  . Heart disease Father   . Stroke Father   . Hyperlipidemia Father   . Healthy Sister   . Healthy Brother   . Heart disease Mother   . Dementia  Mother        on hospice early 2020-died from COVID-65 in rehab-got sick from nurse  . Colon cancer Neg Hx     Medications- reviewed and updated Current Outpatient Medications  Medication Sig Dispense Refill  . aspirin EC 81 MG tablet Take 81 mg by mouth daily.    Marland Kitchen levothyroxine (SYNTHROID) 137 MCG tablet Take 1 tablet (137 mcg total) by mouth daily. 90 tablet 3  . metFORMIN (GLUCOPHAGE) 500 MG tablet TAKE 1 TABLET BY MOUTH  TWICE DAILY 180 tablet 3  . Multiple Vitamins-Minerals (MENS 50+ MULTI VITAMIN/MIN PO) Take 1 tablet by mouth daily.    . pantoprazole (PROTONIX) 40 MG tablet Take 1 tablet (40 mg total) by mouth daily. 90 tablet 3  . sildenafil (VIAGRA) 100 MG tablet Take 0.5 tablets (50 mg total) by mouth as needed for erectile dysfunction. 30 tablet 6  . simvastatin (ZOCOR) 40 MG tablet Take 1 tablet (40 mg total) by mouth every evening. 90 tablet 3   No current facility-administered medications for this visit.    Allergies-reviewed and updated No Known Allergies  Social History   Social History Narrative   Married. 3 children. 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    Also does landscaping as a side business      Hobbies: golf   Objective  Objective:  BP  138/86   Pulse 71   Temp 98.6 F (37 C) (Temporal)   Ht 5\' 11"  (1.803 m)   Wt 206 lb 12.8 oz (93.8 kg)   SpO2 97%   BMI 28.84 kg/m  Gen: NAD, resting comfortably HEENT: Mucous membranes are moist. Oropharynx normal Neck: no thyromegaly CV: RRR no murmurs rubs or gallops Lungs: CTAB no crackles, wheeze, rhonchi Abdomen: soft/nontender/nondistended/normal bowel sounds. No rebound or guarding.  Ext: no edema Skin: warm, dry Neuro: grossly normal, moves all extremities, PERRLA  Diabetic Foot Exam - Simple   Simple Foot Form Diabetic Foot exam was performed with the following findings: Yes 05/25/2020 10:24 AM  Visual Inspection No deformities, no ulcerations, no other skin breakdown  bilaterally: Yes Sensation Testing Intact to touch and monofilament testing bilaterally: Yes Pulse Check Posterior Tibialis and Dorsalis pulse intact bilaterally: Yes Comments       Assessment and Plan  65 y.o. male presenting for annual physical.  Health Maintenance counseling: 1. Anticipatory guidance: Patient counseled regarding regular dental exams - advised q6 months, eye exams - needs to schedule yearly,  avoiding smoking and second hand smoke- does smoke , limiting alcohol to 2 beverages per day - very very low intake 1 beer per 6 months.   2. Risk factor reduction:  Advised patient of need for regular exercise and diet rich and fruits and vegetables to reduce risk of heart attack and stroke. Exercise- active in yard. Diet-up 18 lbs from last visit with covid 19- we discussed reversing this trend.  Wt Readings from Last 3 Encounters:  05/25/20 206 lb 12.8 oz (93.8 kg)  11/01/18 188 lb (85.3 kg)  05/06/18 199 lb 12.8 oz (90.6 kg)  3. Immunizations/screenings/ancillary studies- has opted out of covid 19 vaccine (wife's aunt died an hour after an immunization, had another friend that was in ICU for 3 weeks after vaccination). Declines flu shot.  Immunization History  Administered Date(s) Administered  . Influenza Inj Mdck Quad Pf 03/16/2018  . Influenza Split 03/06/2011  . Influenza, Quadrivalent, Recombinant, Inj, Pf 02/27/2019  . Influenza,inj,Quad PF,6+ Mos 04/04/2013, 04/01/2017  . Influenza-Unspecified 02/02/2014, 02/27/2015, 02/07/2016, 02/16/2018  . Pneumococcal Polysaccharide-23 05/06/2018  . Tdap 07/29/2013  4. Prostate cancer screening- low risk prior psa trend- update psa today  Lab Results  Component Value Date   PSA 1.12 05/06/2018   PSA 0.95 04/01/2017   PSA 0.79 11/19/2015   5. Colon cancer screening - 04/08/2017 with 10 year follow up planned 6. Skin cancer screening- Dr. Melina Copa dermatology following. advised regular sunscreen use. Denies worrisome, changing, or  new skin lesions.  7. current smoker- see below 8. STD screening - only active with wife  Status of chronic or acute concerns   # Diabetes S: Medication:metformin 500mg  BID  CBGs- does not check Exercise and diet- diet has slipped up- we discussed improving this Lab Results  Component Value Date   HGBA1C 6.4 11/08/2018   HGBA1C 6.5 05/06/2018   HGBA1C 6.3 (A) 11/23/2017   A/P: hopefully controlled- update a1c  #hypothyroidism S: compliant On thyroid medication-levothyroxine 137 mcg  Lab Results  Component Value Date   TSH 1.07 11/08/2018   A/P:hopefully stable- update today . Continue current meds for now   #hyperlipidemia S: Medication:simvastatin 40mg   Lab Results  Component Value Date   CHOL 149 05/06/2018   HDL 38.30 (L) 05/06/2018   LDLCALC 76 05/06/2018   LDLDIRECT 81.0 11/08/2018   TRIG 171.0 (H) 05/06/2018   CHOLHDL 4 05/06/2018   A/P:  hopefully controlled- update lipid panel. Gets some myalgias- discussed a week or two trial off- he will let me know if aches/pains improve off meds   #Smoker S:1 PPD. Has quit for 3 years in past starting with chantix- and has quit several other times.  26 pack years in 2022.  A/P: Patient willing to try quitting-we will use a course of Chantix- he will stop smoking 1 week after starting. Gave 4 months worth total. Also get UA and refer to lung cancer screening program  # COPD S: patient wheezes occasionally. Has some AM congestion gets better as day goes on A/P: does not have inhaler on hand- we opted to send this in for him and can use prn  Recommended follow up: 6 months follow up tentatively if a1c is under 7 and other labs look good  Lab/Order associations: fasting   ICD-10-CM   1. Diabetes mellitus without complication (HCC)  [id number]     No orders of the defined types were placed in this encounter.   Return precautions advised.  Garret Reddish, MD

## 2020-05-24 NOTE — Patient Instructions (Addendum)
Please stop by lab before you go If you have mychart- we will send your results within 3 business days of [place] receiving them.  If you do not have mychart- we will call you about results within 5 business days of [place] receiving them.  *please also note that you will see labs on mychart as soon as they post. I will later go in and write notes on them- will say "notes from Dr. Yong Channel"  We will call you within two weeks about your referral to lung cancer screening program. If you do not hear within 2 weeks, give [place] a call.   Work on Oceanographer exercise/weight loss to get back down to prior level  Stop chantix if changes mood or causes bad dreams. Can use up to 4 months. Happy to check back in if youd like but I know you've had good success in the past  Health Maintenance Due  Topic Date Due  . OPHTHALMOLOGY EXAM Will get this scheduled. Have them send [place] a copy 11/17/2019   Recommended follow up: 6 months follow up tentatively if a1c is under 7 and other labs look good

## 2020-05-25 ENCOUNTER — Ambulatory Visit: Payer: Managed Care, Other (non HMO) | Admitting: Family Medicine

## 2020-05-25 ENCOUNTER — Other Ambulatory Visit: Payer: Self-pay

## 2020-05-25 ENCOUNTER — Encounter: Payer: Self-pay | Admitting: Family Medicine

## 2020-05-25 VITALS — BP 138/86 | HR 71 | Temp 98.6°F | Ht 71.0 in | Wt 206.8 lb

## 2020-05-25 DIAGNOSIS — Z6791 Unspecified blood type, Rh negative: Secondary | ICD-10-CM

## 2020-05-25 DIAGNOSIS — Z125 Encounter for screening for malignant neoplasm of prostate: Secondary | ICD-10-CM

## 2020-05-25 DIAGNOSIS — F17200 Nicotine dependence, unspecified, uncomplicated: Secondary | ICD-10-CM

## 2020-05-25 DIAGNOSIS — E785 Hyperlipidemia, unspecified: Secondary | ICD-10-CM | POA: Diagnosis not present

## 2020-05-25 DIAGNOSIS — E119 Type 2 diabetes mellitus without complications: Secondary | ICD-10-CM

## 2020-05-25 DIAGNOSIS — E039 Hypothyroidism, unspecified: Secondary | ICD-10-CM

## 2020-05-25 DIAGNOSIS — E1169 Type 2 diabetes mellitus with other specified complication: Secondary | ICD-10-CM

## 2020-05-25 DIAGNOSIS — J449 Chronic obstructive pulmonary disease, unspecified: Secondary | ICD-10-CM

## 2020-05-25 DIAGNOSIS — Z0000 Encounter for general adult medical examination without abnormal findings: Secondary | ICD-10-CM

## 2020-05-25 LAB — CBC WITH DIFFERENTIAL/PLATELET
Basophils Absolute: 0 10*3/uL (ref 0.0–0.1)
Basophils Relative: 0.7 % (ref 0.0–3.0)
Eosinophils Absolute: 0.3 10*3/uL (ref 0.0–0.7)
Eosinophils Relative: 3.7 % (ref 0.0–5.0)
HCT: 44.7 % (ref 39.0–52.0)
Hemoglobin: 15.2 g/dL (ref 13.0–17.0)
Lymphocytes Relative: 22.6 % (ref 12.0–46.0)
Lymphs Abs: 1.5 10*3/uL (ref 0.7–4.0)
MCHC: 34 g/dL (ref 30.0–36.0)
MCV: 91.3 fl (ref 78.0–100.0)
Monocytes Absolute: 0.4 10*3/uL (ref 0.1–1.0)
Monocytes Relative: 6.4 % (ref 3.0–12.0)
Neutro Abs: 4.6 10*3/uL (ref 1.4–7.7)
Neutrophils Relative %: 66.6 % (ref 43.0–77.0)
Platelets: 260 10*3/uL (ref 150.0–400.0)
RBC: 4.89 Mil/uL (ref 4.22–5.81)
RDW: 12.9 % (ref 11.5–15.5)
WBC: 6.8 10*3/uL (ref 4.0–10.5)

## 2020-05-25 LAB — POC URINALSYSI DIPSTICK (AUTOMATED)
Bilirubin, UA: NEGATIVE
Blood, UA: NEGATIVE
Glucose, UA: NEGATIVE
Ketones, UA: NEGATIVE
Leukocytes, UA: NEGATIVE
Nitrite, UA: NEGATIVE
Protein, UA: NEGATIVE
Spec Grav, UA: 1.02 (ref 1.010–1.025)
Urobilinogen, UA: 0.2 E.U./dL
pH, UA: 6 (ref 5.0–8.0)

## 2020-05-25 LAB — COMPREHENSIVE METABOLIC PANEL
ALT: 21 U/L (ref 0–53)
AST: 14 U/L (ref 0–37)
Albumin: 4.7 g/dL (ref 3.5–5.2)
Alkaline Phosphatase: 44 U/L (ref 39–117)
BUN: 18 mg/dL (ref 6–23)
CO2: 29 mEq/L (ref 19–32)
Calcium: 10.1 mg/dL (ref 8.4–10.5)
Chloride: 102 mEq/L (ref 96–112)
Creatinine, Ser: 1.1 mg/dL (ref 0.40–1.50)
GFR: 71.18 mL/min (ref >60.00)
Glucose, Bld: 109 mg/dL — ABNORMAL HIGH (ref 70–99)
Potassium: 4.3 mEq/L (ref 3.5–5.1)
Sodium: 138 mEq/L (ref 135–145)
Total Bilirubin: 0.5 mg/dL (ref 0.2–1.2)
Total Protein: 7.4 g/dL (ref 6.0–8.3)

## 2020-05-25 LAB — LIPID PANEL
Cholesterol: 164 mg/dL (ref 0–200)
HDL: 41.3 mg/dL (ref >39.00)
NonHDL: 123.08
Total CHOL/HDL Ratio: 4
Triglycerides: 213 mg/dL — ABNORMAL HIGH (ref 0.0–149.0)
VLDL: 42.6 mg/dL — ABNORMAL HIGH (ref 0.0–40.0)

## 2020-05-25 LAB — LDL CHOLESTEROL, DIRECT: Direct LDL: 98 mg/dL

## 2020-05-25 LAB — HEMOGLOBIN A1C: Hgb A1c MFr Bld: 7.1 % — ABNORMAL HIGH (ref 4.6–6.5)

## 2020-05-25 LAB — MICROALBUMIN / CREATININE URINE RATIO
Creatinine,U: 81.1 mg/dL
Microalb Creat Ratio: 0.9 mg/g (ref 0.0–30.0)
Microalb, Ur: <0.7 mg/dL (ref 0.0–1.9)

## 2020-05-25 LAB — TSH: TSH: 4.57 u[IU]/mL — ABNORMAL HIGH (ref 0.35–4.50)

## 2020-05-25 LAB — PSA: PSA: 0.74 ng/mL (ref 0.10–4.00)

## 2020-05-25 MED ORDER — VARENICLINE TARTRATE 1 MG PO TABS: 1.0000 mg | ORAL_TABLET | Freq: Two times a day (BID) | ORAL | 2 refills | Status: DC

## 2020-05-25 MED ORDER — CHANTIX STARTING MONTH PAK 0.5 MG X 11 & 1 MG X 42 PO TABS: ORAL_TABLET | ORAL | 0 refills | Status: DC

## 2020-05-25 MED ORDER — ALBUTEROL SULFATE HFA 108 (90 BASE) MCG/ACT IN AERS: 2.0000 | INHALATION_SPRAY | Freq: Four times a day (QID) | RESPIRATORY_TRACT | 2 refills | Status: DC | PRN

## 2020-05-25 NOTE — Addendum Note (Signed)
Addended by: Brandy Hale on: 05/25/2020 10:49 AM   Modules accepted: Orders

## 2020-05-28 ENCOUNTER — Other Ambulatory Visit: Payer: Self-pay

## 2020-05-28 DIAGNOSIS — E039 Hypothyroidism, unspecified: Secondary | ICD-10-CM

## 2020-05-28 LAB — ABO AND RH

## 2020-05-28 MED ORDER — LEVOTHYROXINE SODIUM 150 MCG PO TABS
150.0000 ug | ORAL_TABLET | Freq: Every day | ORAL | 3 refills | Status: DC
Start: 2020-05-28 — End: 2021-05-08

## 2020-06-11 ENCOUNTER — Other Ambulatory Visit: Payer: Self-pay | Admitting: *Deleted

## 2020-06-11 DIAGNOSIS — F17210 Nicotine dependence, cigarettes, uncomplicated: Secondary | ICD-10-CM

## 2020-06-11 NOTE — Progress Notes (Signed)
chest

## 2020-07-02 ENCOUNTER — Encounter: Payer: Self-pay | Admitting: Acute Care

## 2020-07-02 ENCOUNTER — Other Ambulatory Visit: Payer: Self-pay

## 2020-07-02 ENCOUNTER — Ambulatory Visit (HOSPITAL_BASED_OUTPATIENT_CLINIC_OR_DEPARTMENT_OTHER)
Admission: RE | Admit: 2020-07-02 | Discharge: 2020-07-02 | Disposition: A | Discharge status: Home / Self Care | Payer: Managed Care, Other (non HMO) | Source: Ambulatory Visit | Attending: Acute Care | Admitting: Acute Care

## 2020-07-02 ENCOUNTER — Ambulatory Visit (INDEPENDENT_AMBULATORY_CARE_PROVIDER_SITE_OTHER): Payer: Managed Care, Other (non HMO) | Admitting: Acute Care

## 2020-07-02 VITALS — BP 118/74 | HR 74 | Temp 97.4°F | Ht 70.5 in | Wt 209.4 lb

## 2020-07-02 DIAGNOSIS — Z122 Encounter for screening for malignant neoplasm of respiratory organs: Secondary | ICD-10-CM

## 2020-07-02 DIAGNOSIS — F17210 Nicotine dependence, cigarettes, uncomplicated: Secondary | ICD-10-CM

## 2020-07-02 NOTE — Patient Instructions (Signed)
Thank you for participating in the [facility] Screening Program. It was our pleasure to meet you today. We will call you with the results of your scan within the next few days. Your scan will be assigned a Lung RADS category score by the physicians reading the scans.  This Lung RADS score determines follow up scanning.  See below for description of categories, and follow up screening recommendations. We will be in touch to schedule your follow up screening annually or based on recommendations of our providers. We will fax a copy of your scan results to your Primary Care Physician, or the physician who referred you to the program, to ensure they have the results. Please call the office if you have any questions or concerns regarding your scanning experience or results.  Our office number is [phone]. Please speak with Denise Phelps, RN. She is our Lung Cancer Screening RN. If she is unavailable when you call, please have the office staff send her a message. She will return your call at her earliest convenience. Remember, if your scan is normal, we will scan you annually as long as you continue to meet the criteria for the program. (Age 55-77, Current smoker or smoker who has quit within the last 15 years). If you are a smoker, remember, quitting is the single most powerful action that you can take to decrease your risk of lung cancer and other pulmonary, breathing related problems. We know quitting is hard, and we are here to help.  Please let us know if there is anything we can do to help you meet your goal of quitting. If you are a former smoker, congratulations. We are proud of you! Remain smoke free! Remember you can refer friends or family members through the number above.  We will screen them to make sure they meet criteria for the program. Thank you for helping us take better care of you by participating in Lung Screening.  Lung RADS Categories:  Lung RADS 1: no nodules  or definitely non-concerning nodules.  Recommendation is for a repeat annual scan in 12 months.  Lung RADS 2:  nodules that are non-concerning in appearance and behavior with a very low likelihood of becoming an active cancer. Recommendation is for a repeat annual scan in 12 months.  Lung RADS 3: nodules that are probably non-concerning , includes nodules with a low likelihood of becoming an active cancer.  Recommendation is for a 6-month repeat screening scan. Often noted after an upper respiratory illness. We will be in touch to make sure you have no questions, and to schedule your 6-month scan.  Lung RADS 4 A: nodules with concerning findings, recommendation is most often for a follow up scan in 3 months or additional testing based on our provider's assessment of the scan. We will be in touch to make sure you have no questions and to schedule the recommended 3 month follow up scan.  Lung RADS 4 B:  indicates findings that are concerning. We will be in touch with you to schedule additional diagnostic testing based on our provider's  assessment of the scan.   

## 2020-07-02 NOTE — Progress Notes (Signed)
Shared Decision Making Visit Lung Cancer Screening Program [phone])   Eligibility:  Age 65 y.o.  Pack Years Smoking History Calculation 40 pack year smoking history (# packs/per year x # years smoked)  Recent History of coughing up blood  no  Unexplained weight loss? no ( >Than 15 pounds within the last 6 months )  Prior History Lung / other cancer no (Diagnosis within the last 5 years already requiring surveillance chest CT Scans).  Smoking Status Current Smoker  Former Smokers: Years since quit: NA  Quit Date: NA  Visit Components:  Discussion included one or more decision making aids. yes  Discussion included risk/benefits of screening. yes  Discussion included potential follow up diagnostic testing for abnormal scans. yes  Discussion included meaning and risk of over diagnosis. yes  Discussion included meaning and risk of False Positives. yes  Discussion included meaning of total radiation exposure. yes  Counseling Included:  Importance of adherence to annual lung cancer LDCT screening. yes  Impact of comorbidities on ability to participate in the program. yes  Ability and willingness to under diagnostic treatment. yes  Smoking Cessation Counseling:  Current Smokers:   Discussed importance of smoking cessation. yes  Information about tobacco cessation classes and interventions provided to patient. yes  Patient provided with "ticket" for LDCT Scan. yes  Symptomatic Patient. no  Counseling NA  Diagnosis Code: Tobacco Use Z72.0  Asymptomatic Patient yes  Counseling (Intermediate counseling: > three minutes counseling) [id number]  Former Smokers:   Discussed the importance of maintaining cigarette abstinence. yes  Diagnosis Code: Personal History of Nicotine Dependence. L37.342  Information about tobacco cessation classes and interventions provided to patient. Yes  Patient provided with "ticket" for LDCT Scan. yes  Written Order for Lung Cancer  Screening with LDCT placed in Epic. Yes (CT Chest Lung Cancer Screening Low Dose W/O CM) [id number] Z12.2-Screening of respiratory organs Z87.891-Personal history of nicotine dependence  BP 118/74 (BP Location: Left Arm, Cuff Size: Normal)   Pulse 74   Temp (!) 97.4 F (36.3 C) (Oral)   Ht 5' 10.5" (1.791 m)   Wt 209 lb 6.4 oz (95 kg)   SpO2 95%   BMI 29.62 kg/m    I have spent 25 minutes of face to face time with Tracy Turner discussing the risks and benefits of lung cancer screening. We viewed a power point together that explained in detail the above noted topics. We paused at intervals to allow for questions to be asked and answered to ensure understanding.We discussed that the single most powerful action that he can take to decrease his risk of developing lung cancer is to quit smoking. We discussed whether or not he is ready to commit to setting a quit date. We discussed options for tools to aid in quitting smoking including nicotine replacement therapy, non-nicotine medications, support groups, Quit Smart classes, and behavior modification. We discussed that often times setting smaller, more achievable goals, such as eliminating 1 cigarette a day for a week and then 2 cigarettes a day for a week can be helpful in slowly decreasing the number of cigarettes smoked. This allows for a sense of accomplishment as well as providing a clinical benefit. I gave him the " Be Stronger Than Your Excuses" card with contact information for community resources, classes, free nicotine replacement therapy, and access to mobile apps, text messaging, and on-line smoking cessation help. I have also given him my card and contact information in the event he needs to contact me. We  discussed the time and location of the scan, and that either Doroteo Glassman RN or I will call with the results within 24-48 hours of receiving them. I have offered him  a copy of the power point we viewed  as a resource in the event they need  reinforcement of the concepts we discussed today in the office. The patient verbalized understanding of all of  the above and had no further questions upon leaving the office. They have my contact information in the event they have any further questions.  I spent 3 minutes counseling on smoking cessation and the health risks of continued tobacco abuse.  I explained to the patient that there has been a high incidence of coronary artery disease noted on these exams. I explained that this is a non-gated exam therefore degree or severity cannot be determined. This patient is currently on statin therapy. I have asked the patient to follow-up with their PCP regarding any incidental finding of coronary artery disease and management with diet or medication as their PCP  feels is clinically indicated. The patient verbalized understanding of the above and had no further questions upon completion of the visit.  Pt. Has successfully quit smoking in the past. He has a prescription from PCP for Chantix, he is waiting on availability through pharmacy to get this filled. I gave him Be Stronger than your excuses card with 1-800-QUIT NOW for free nicotine patches gum and mints. Also the number for smoking cessation classes.     Tracy Spatz, NP 07/02/2020 4:03 PM

## 2020-07-09 ENCOUNTER — Other Ambulatory Visit (INDEPENDENT_AMBULATORY_CARE_PROVIDER_SITE_OTHER): Payer: Managed Care, Other (non HMO)

## 2020-07-09 ENCOUNTER — Other Ambulatory Visit: Payer: Self-pay

## 2020-07-09 DIAGNOSIS — E039 Hypothyroidism, unspecified: Secondary | ICD-10-CM

## 2020-07-09 LAB — TSH: TSH: 3.94 u[IU]/mL (ref 0.35–4.50)

## 2020-07-10 NOTE — Progress Notes (Signed)
Please call patient and let them  know their  low dose Ct was read as a Lung RADS 2: nodules that are benign in appearance and behavior with a very low likelihood of becoming a clinically active cancer due to size or lack of growth. Recommendation per radiology is for a repeat LDCT in 12 months. .Please let them  know we will order and schedule their  annual screening scan for 07/2021. Please let them  know there was notation of CAD on their  scan.  Please remind the patient  that this is a non-gated exam therefore degree or severity of disease  cannot be determined. Please have them  follow up with their PCP regarding potential risk factor modification, dietary therapy or pharmacologic therapy if clinically indicated. Pt.  is  currently on statin therapy. Please place order for annual  screening scan for  07/2021 and fax results to PCP. Thanks so much. 

## 2020-07-11 ENCOUNTER — Telehealth: Payer: Self-pay

## 2020-07-11 NOTE — Telephone Encounter (Signed)
Called and lm for pt tcb. 

## 2020-07-11 NOTE — Telephone Encounter (Signed)
Pt returned call about lab results. Please advise.

## 2020-07-11 NOTE — Telephone Encounter (Signed)
Patient did not return call. Patient viewed lab results via my chart.

## 2020-07-12 ENCOUNTER — Other Ambulatory Visit: Payer: Self-pay | Admitting: *Deleted

## 2020-07-12 DIAGNOSIS — F17210 Nicotine dependence, cigarettes, uncomplicated: Secondary | ICD-10-CM

## 2020-07-29 NOTE — Progress Notes (Signed)
Phone [phone] In person visit   Subjective:   Tracy Turner is a 65 y.o. year old very pleasant male patient who presents for/with See problem oriented charting Chief Complaint  Patient presents with  . Flank Pain    Right side , Sharp pain   . Back Pain    Patient states that his back is always in knots, on his right lower side it's a very uncomfortable pain    This visit occurred during the SARS-CoV-2 public health emergency.  Safety protocols were in place, including screening questions prior to the visit, additional usage of staff PPE, and extensive cleaning of exam room while observing appropriate contact time as indicated for disinfecting solutions.   Past Medical History-  Patient Active Problem List   Diagnosis Date Noted  . Smoker 05/06/2018    Priority: High  . COPD (chronic obstructive pulmonary disease) ([place])     Priority: High  . Diabetes mellitus without complication ([place]) 42/35/3614    Priority: High  . Hypothyroidism 07/20/2009    Priority: Medium  . Hyperlipidemia associated with type 2 diabetes mellitus ([place]) 07/20/2009    Priority: Medium  . Testosterone deficiency 08/22/2013    Priority: Low  . GERD 01/21/2010    Priority: Low    Medications- reviewed and updated Current Outpatient Medications  Medication Sig Dispense Refill  . aspirin EC 81 MG tablet Take 81 mg by mouth daily.    Marland Kitchen levothyroxine (SYNTHROID) 150 MCG tablet Take 1 tablet (150 mcg total) by mouth daily. 90 tablet 3  . metFORMIN (GLUCOPHAGE) 500 MG tablet TAKE 1 TABLET BY MOUTH  TWICE DAILY 180 tablet 3  . Multiple Vitamins-Minerals (MENS 50+ MULTI VITAMIN/MIN PO) Take 1 tablet by mouth daily.    . pantoprazole (PROTONIX) 40 MG tablet Take 1 tablet (40 mg total) by mouth daily. 90 tablet 3  . sildenafil (VIAGRA) 100 MG tablet Take 0.5 tablets (50 mg total) by mouth as needed for erectile dysfunction. 30 tablet 6  . tizanidine (ZANAFLEX) 2 MG capsule Take 1 capsule (2 mg total) by  mouth 3 (three) times daily as needed for muscle spasms (do not drive for 8 hours after using). 30 capsule 0  . albuterol (VENTOLIN HFA) 108 (90 Base) MCG/ACT inhaler Inhale 2 puffs into the lungs every 6 (six) hours as needed for wheezing or shortness of breath. (Patient not taking: Reported on 07/30/2020) 1 each 2  . simvastatin (ZOCOR) 40 MG tablet Take 1 tablet (40 mg total) by mouth every evening. (Patient not taking: Reported on 07/30/2020) 90 tablet 3  . varenicline (CHANTIX CONTINUING MONTH PAK) 1 MG tablet Take 1 tablet (1 mg total) by mouth 2 (two) times daily. 60 tablet 2  . varenicline (CHANTIX STARTING MONTH PAK) 0.5 MG X 11 & 1 MG X 42 tablet Take one 0.5 mg tablet by mouth once daily for 3 days, then increase to one 0.5 mg tablet twice daily for 4 days, then increase to one 1 mg tablet twice daily. 42 tablet 0   No current facility-administered medications for this visit.     Objective:  BP 120/74   Pulse 87   Temp 99.3 F (37.4 C) (Temporal)   Ht 5\' 11"  (1.803 m)   Wt 206 lb 9.6 oz (93.7 kg)   SpO2 95%   BMI 28.81 kg/m   CV: RRR  Lungs: nonlabored, normal respiratory rate Abdomen: soft/nondistended  Ext: no edema Skin: warm, dry, no obvious rash of her back Back - Normal  skin, Spine with normal alignment and no deformity.  No tenderness to vertebral process palpation.  Paraspinous muscles are not tender and without spasm-points to the lower lateral back as source of pain with occurs-gets a similar area of pain and spasm just on her right shoulder blade but less frequently.   Range of motion is full at neck and lumbar sacral regions. Negative Straight leg raise.  Neuro- no saddle anesthesia, 5/5 strength lower extremities, 2+ reflexes     Assessment and Plan   Right Sided low back Pain S: has had issues with low back for many years- dates back to basketball in his with a hard fall. Was on valium and darvocet at first but made him feel loopy.   Ever since then right at  bottom of his spine- with bending over would get a poker sensation and then would happen for 2 weeks and then he would feel better so in general he is protective of low back- doesn't stretch or do exercises much as concerned he could bother him. Also with history of back tightening up intermittently for many years- usually on right side.   Last fall was playing golf and hit a ball and felt immediate intense pain and had to stop and then didn't play for a while- went back a few weeks ago and with a shot felt similar pain in different location. Then took 2 weeks off and went back and had severe pain- almost brought him to tears. Bouncing on mower today felt a similar pain. Intermittent numbness in right foot but better with newer shoes  He did stop simvastatin a few days ago and wants to see if that will make a difference. Ibuprofen helps some  Since injury, Has been doing some stretching, biking to try to help lose weight. States has never had x-rays of low back  This is probably his best day as far as improvement from pain since it started over a month ago  ROS-No saddle anesthesia, bladder incontinence, fecal incontinence, weakness in extremity, numbness or tingling in extremity. History negative for trauma, history of cancer, fever, chills, unintentional weight loss, recent bacterial infection, recent IV drug use, HIV, pain worse at night or while supine.  A/P: 65 year old male with intermittent back pain for years with worsening flare over the last month.  No obvious red flags other than chronicity.  Due to chronicity, will get x-rays of lumbar spine.  He also wants to make sure his kidneys are okay-check urine and CMP. -Trial tizanidine as needed for when he gets muscle spasms associated with this-reports history of very tight muscles intermittently at times-advised no driving 8 hours afterwards -We will refer to physical therapy to try to help with more acute issues but also get a plan in place to  prevent future flareups -Discussed potential sports medicine referral but we opted to hold off on this unless he fails to make improvement with PT or if we find a new direction based off of lab/imaging  #Smoking cessation-patient has not been able to get Chantix yet-asks for printed prescription-encourage full cessation-glad he is willing to try Chantix but asked him to do this after back pain issues have improved and off tizanidine  Recommended follow up: Return for as needed for new, worsening, persistent symptoms.  Lab/Order associations:   ICD-10-CM   1. Chronic right-sided low back pain without sciatica  M54.50 Comprehensive metabolic panel   B44.96 POCT Urinalysis Dipstick (Automated)    DG Lumbar Spine Complete  Ambulatory referral to Physical Therapy    Meds ordered this encounter  Medications  . tizanidine (ZANAFLEX) 2 MG capsule    Sig: Take 1 capsule (2 mg total) by mouth 3 (three) times daily as needed for muscle spasms (do not drive for 8 hours after using).    Dispense:  30 capsule    Refill:  0  . varenicline (CHANTIX STARTING MONTH PAK) 0.5 MG X 11 & 1 MG X 42 tablet    Sig: Take one 0.5 mg tablet by mouth once daily for 3 days, then increase to one 0.5 mg tablet twice daily for 4 days, then increase to one 1 mg tablet twice daily.    Dispense:  42 tablet    Refill:  0  . varenicline (CHANTIX CONTINUING MONTH PAK) 1 MG tablet    Sig: Take 1 tablet (1 mg total) by mouth 2 (two) times daily.    Dispense:  60 tablet    Refill:  2    Return precautions advised.  Garret Reddish, MD

## 2020-07-29 NOTE — Patient Instructions (Addendum)
Health Maintenance Due  Topic Date Due  . OPHTHALMOLOGY EXAM 09/10/2020 scheduled.  11/17/2019   Please stop by lab and x-ray before you go If you have mychart- we will send your results within 3 business days of [place] receiving them.  If you do not have mychart- we will call you about results within 5 business days of [place] receiving them.  *please also note that you will see labs on mychart as soon as they post. I will later go in and write notes on them- will say "notes from Dr. Yong Channel"  With your chronic low back pain and recent flareup, lets try tizanidine as a muscle relaxant given you are having recurrent spasms of the muscle.  Lets also refer you to physical therapy to see if they can walk you through some exercises to help you heal in the long run.  Tell them your goal is to try to get exercise you can do at home within 1 or 2 visits.   If not improving within a few weeks or symptoms worsen please let me refer you to sports medicine or to orthopedics of your choice  Recommended follow up: Return for as needed for new, worsening, persistent symptoms.

## 2020-07-30 ENCOUNTER — Ambulatory Visit (INDEPENDENT_AMBULATORY_CARE_PROVIDER_SITE_OTHER): Payer: Managed Care, Other (non HMO) | Admitting: Family Medicine

## 2020-07-30 ENCOUNTER — Encounter: Payer: Self-pay | Admitting: Family Medicine

## 2020-07-30 ENCOUNTER — Ambulatory Visit (INDEPENDENT_AMBULATORY_CARE_PROVIDER_SITE_OTHER): Payer: Managed Care, Other (non HMO)

## 2020-07-30 ENCOUNTER — Other Ambulatory Visit: Payer: Self-pay

## 2020-07-30 VITALS — BP 120/74 | HR 87 | Temp 99.3°F | Ht 71.0 in | Wt 206.6 lb

## 2020-07-30 DIAGNOSIS — M545 Low back pain, unspecified: Secondary | ICD-10-CM

## 2020-07-30 DIAGNOSIS — G8929 Other chronic pain: Secondary | ICD-10-CM | POA: Diagnosis not present

## 2020-07-30 LAB — POC URINALSYSI DIPSTICK (AUTOMATED)
Bilirubin, UA: NEGATIVE
Blood, UA: NEGATIVE
Glucose, UA: NEGATIVE
Ketones, UA: NEGATIVE
Leukocytes, UA: NEGATIVE
Nitrite, UA: NEGATIVE
Protein, UA: NEGATIVE
Spec Grav, UA: >=1.03 — AB (ref 1.010–1.025)
Urobilinogen, UA: 0.2 E.U./dL
pH, UA: 5.5 (ref 5.0–8.0)

## 2020-07-30 MED ORDER — TIZANIDINE HCL 2 MG PO CAPS: 2.0000 mg | ORAL_CAPSULE | Freq: Three times a day (TID) | ORAL | 0 refills | Status: DC | PRN

## 2020-07-30 MED ORDER — VARENICLINE TARTRATE 1 MG PO TABS: 1.0000 mg | ORAL_TABLET | Freq: Two times a day (BID) | ORAL | 2 refills | Status: DC

## 2020-07-30 MED ORDER — CHANTIX STARTING MONTH PAK 0.5 MG X 11 & 1 MG X 42 PO TABS: ORAL_TABLET | ORAL | 0 refills | Status: DC

## 2020-07-30 NOTE — Addendum Note (Signed)
Addended by: Brandy Hale on: 07/30/2020 03:13 PM   Modules accepted: Orders

## 2020-07-31 ENCOUNTER — Encounter: Payer: Self-pay | Admitting: Family Medicine

## 2020-07-31 DIAGNOSIS — I700 Atherosclerosis of aorta: Secondary | ICD-10-CM | POA: Insufficient documentation

## 2020-07-31 LAB — COMPREHENSIVE METABOLIC PANEL
ALT: 24 U/L (ref 0–53)
AST: 15 U/L (ref 0–37)
Albumin: 4.6 g/dL (ref 3.5–5.2)
Alkaline Phosphatase: 43 U/L (ref 39–117)
BUN: 20 mg/dL (ref 6–23)
CO2: 30 mEq/L (ref 19–32)
Calcium: 10.3 mg/dL (ref 8.4–10.5)
Chloride: 103 mEq/L (ref 96–112)
Creatinine, Ser: 1.19 mg/dL (ref 0.40–1.50)
GFR: 64.69 mL/min (ref >60.00)
Glucose, Bld: 93 mg/dL (ref 70–99)
Potassium: 4.6 mEq/L (ref 3.5–5.1)
Sodium: 140 mEq/L (ref 135–145)
Total Bilirubin: 0.3 mg/dL (ref 0.2–1.2)
Total Protein: 7 g/dL (ref 6.0–8.3)

## 2020-08-10 ENCOUNTER — Encounter: Payer: Self-pay | Admitting: Physical Therapy

## 2020-08-10 ENCOUNTER — Ambulatory Visit: Payer: Managed Care, Other (non HMO) | Attending: Family Medicine | Admitting: Physical Therapy

## 2020-08-10 ENCOUNTER — Other Ambulatory Visit: Payer: Self-pay

## 2020-08-10 DIAGNOSIS — R293 Abnormal posture: Secondary | ICD-10-CM | POA: Insufficient documentation

## 2020-08-10 DIAGNOSIS — R29898 Other symptoms and signs involving the musculoskeletal system: Secondary | ICD-10-CM | POA: Diagnosis present

## 2020-08-10 DIAGNOSIS — M62838 Other muscle spasm: Secondary | ICD-10-CM | POA: Diagnosis present

## 2020-08-10 DIAGNOSIS — M545 Low back pain, unspecified: Secondary | ICD-10-CM | POA: Insufficient documentation

## 2020-08-10 DIAGNOSIS — G8929 Other chronic pain: Secondary | ICD-10-CM | POA: Insufficient documentation

## 2020-08-10 NOTE — Therapy (Signed)
[facility] [address]  [place], [place], [address] Phone: [phone]   Fax:  [phone]  Physical Therapy Evaluation  Patient Details  Name: Tracy Turner MRN: [id number] Date of Birth: 24-May-1955 Referring Provider (PT): Garret Reddish, MD   Encounter Date: 08/10/2020   PT End of Session - 08/10/20 0849    Visit Number 1    Number of Visits 7    Date for PT Re-Evaluation 09/21/20    Authorization Type [employer]    Authorization - Number of Visits 20    PT Start Time 0802    PT Stop Time 0840    PT Time Calculation (min) 38 min    Activity Tolerance Patient tolerated treatment well    Behavior During Therapy [facility] for tasks assessed/performed           Past Medical History:  Diagnosis Date  . COPD (chronic obstructive pulmonary disease) (HCC)    mild  . Diabetes mellitus without complication ([place])    more controlled A1C after 30 # weight lost  . GERD (gastroesophageal reflux disease)   . History of colonic polyps   . Hyperlipidemia   . Hypothyroidism   . NONGONOCOCCAL URETHRITIS 10/12/2009   Qualifier: Diagnosis of  By: Burnice Logan  MD, Doretha Sou   . Tobacco abuse     Past Surgical History:  Procedure Laterality Date  . COLONOSCOPY  2008  . ETT  2007  . INGUINAL HERNIA REPAIR Bilateral 05/25/2014   Procedure: BILATERAL LAPAROSCOPIC INGUINAL HERNIA WITH MESH;  Surgeon: Michael Boston, MD;  Location: [facility];  Service: General;  Laterality: Bilateral;  . INSERTION OF MESH Bilateral 05/25/2014   Procedure: INSERTION OF MESH;  Surgeon: Michael Boston, MD;  Location: [facility];  Service: General;  Laterality: Bilateral;  . TOOTH EXTRACTION     several -molars    There were no vitals filed for this visit.    Subjective Assessment - 08/10/20 0804    Subjective Patient reports pain in the midline low back for the past 46 years after hurting it in high school. Doesn't hurt him all the time, but sometimes when he  bends over it will catch, and he will be unable to stand up for 2 weeks. Notes that he has had episodes when midway through a golf swing he will have a sharp pain in the R LB which also causes difficulty to stand upright. Reports chronic tightness in his muscles. Denies N/T or radiation. Better with rest.    Pertinent History hypothyroidism, HLD, GERD, DM, COPD    Limitations Lifting;Standing;Walking;House hold activities    How long can you sit comfortably? unlimited if not in a flare    How long can you stand comfortably? unlimited if not in a flare    How long can you walk comfortably? unlimited if not in a flare    Diagnostic tests 07/30/20 lumbar xray: Moderate multilevel lumbar degenerative disc disease. Minimal lower lumbar retrolisthesis.    Patient Stated Goals improve core strength and decrease pain    Currently in Pain? Yes    Pain Score 0-No pain    Pain Location Back    Pain Orientation Lower    Pain Descriptors / Indicators Sharp;Spasm    Pain Type Chronic pain;Acute pain              [facility] PT Assessment - 08/10/20 0812      Assessment   Medical Diagnosis Chronic R sided LBP without sciatica  Referring Provider (PT) Garret Reddish, MD    Onset Date/Surgical Date --   chronic, several decades   Next MD Visit not scheduled    Prior Therapy no      Precautions   Precautions None      Balance Screen   Has the patient fallen in the past 6 months No    Has the patient had a decrease in activity level because of a fear of falling?  No    Is the patient reluctant to leave their home because of a fear of falling?  No      Home Ecologist residence    Living Arrangements Spouse/significant other    Available Help at Discharge Family    Type of [place] to enter    Entrance Stairs-Number of Steps 4    Entrance Stairs-Rails Right;Left    Home Layout Two level    Alternate Level Stairs-Number of Steps 15     Alternate Level Stairs-Rails Right      Prior Function   Level of Independence Independent    Vocation Full time employment    Conservation officer, nature, selling insurance- lifting, walking, kneeling    Leisure golf      Cognition   Overall Cognitive Status Within Functional Limits for tasks assessed      Sensation   Light Touch Appears Intact      Coordination   Gross Motor Movements are Fluid and Coordinated Yes      Posture/Postural Control   Posture/Postural Control Postural limitations    Postural Limitations Rounded Shoulders;Increased thoracic kyphosis      ROM / Strength   AROM / PROM / Strength AROM;Strength      AROM   AROM Assessment Site Lumbar    Lumbar Flexion mid shin   "stiff"   Lumbar Extension moderately-severely limited   pain in midline LB   Lumbar - Right Side Bend distal thigh    Lumbar - Left Side Bend distal thigh    Lumbar - Right Rotation WFL    Lumbar - Left Rotation mildly limited      Strength   Strength Assessment Site Hip;Knee;Ankle    Right/Left Hip Right;Left    Right Hip Flexion 5/5    Right Hip Extension 4-/5    Right Hip External Rotation  4+/5    Right Hip Internal Rotation 4+/5    Right Hip ABduction 4+/5    Right Hip ADduction 4+/5    Left Hip Flexion 5/5    Left Hip Extension 4-/5    Left Hip External Rotation 4+/5    Left Hip Internal Rotation 4+/5    Left Hip ABduction 4+/5    Left Hip ADduction 4+/5    Right/Left Knee Right;Left    Right Knee Flexion 4/5    Right Knee Extension 5/5    Left Knee Flexion 4/5    Left Knee Extension 5/5    Right/Left Ankle Right;Left    Right Ankle Dorsiflexion 5/5    Right Ankle Plantar Flexion 5/5    Left Ankle Dorsiflexion 5/5    Left Ankle Plantar Flexion 5/5      Flexibility   Soft Tissue Assessment /Muscle Length yes    Hamstrings B severely tight    Quadriceps mild-moderately tight in mod thomas    Piriformis B severely tight in KTOS and fig 4      Palpation    Spinal mobility hypomobile  in thoracolumbar spine with PAs; TTP L3-S1    Palpation comment no TTP but soft tissue restriction in R rhomboids, B piriformis, B QL      Ambulation/Gait   Gait Pattern Step-through pattern;Within Functional Limits;Trunk flexed    Ambulation Surface Level;Indoor    Gait velocity WFL                      Objective measurements completed on examination: See above findings.               PT Education - 08/10/20 0848    Education Details prognosis, POC, HEP    Person(s) Educated Patient    Methods Explanation;Demonstration;Tactile cues;Verbal cues;Handout    Comprehension Verbalized understanding;Returned demonstration            PT Short Term Goals - 08/10/20 0854      PT SHORT TERM GOAL #1   Title Patient to be independent with initial HEP.    Time 3    Period Weeks    Status New    Target Date 08/31/20             PT Long Term Goals - 08/10/20 0854      PT LONG TERM GOAL #1   Title Patient to be independent with advanced HEP.    Time 6    Period Weeks    Status New    Target Date 09/21/20      PT LONG TERM GOAL #2   Title Patient to demonstrate B hip extension and knee flexion strength >/=4+/5.    Time 6    Period Weeks    Status New    Target Date 09/21/20      PT LONG TERM GOAL #3   Title Patient to demonstrate lumbar AROM WFL and without pain limiting.    Time 6    Period Weeks    Status New    Target Date 09/21/20      PT LONG TERM GOAL #4   Title Patient to demonstrate moderate B piriformis and hip flexor tightness remaining with LE stretching.    Time 6    Period Weeks    Status New    Target Date 09/21/20      PT LONG TERM GOAL #5   Title Patient to demonstrate and recall proper lifting body mechanics with 20lb box from floor to decrease risk of re-injury.    Time 6    Period Weeks    Status New    Target Date 09/21/20                  Plan - 08/10/20 0849    Clinical  Impression Statement Patient is a 65 y/o M presenting to OPPT with c/o chronic midline LBP for the past 46 years. Notes that aside from baseline LBP, he experiences intermittent sharp pain in the R LB when bending forward or during a golf swing resulting in difficulty standing upright. Denies N/T or radiation. Patient is active at work but would like to work on his core strength to prevent future episodes of back pain during golf. Patient today presenting with rounded shoulders and increased thoracic kyphosis, limited and painful lumbar AROM- particularly in extension, B glute and HS weakness, significant tightness in B LEs, thoracolumbar hypomobility and TTP along L3-S1, and soft tissue restriction in R rhomboids, B piriformis, B QL. Patient was educated on gentle mobility and strengthening HEP- patient reported understanding. Would benefit from skilled  PT services 1x/week for 6 weeks to address aforementioned impairments.    Personal Factors and Comorbidities Age;Comorbidity 3+;Fitness;Past/Current Experience;Profession;Time since onset of injury/illness/exacerbation    Comorbidities hypothyroidism, HLD, GERD, DM, COPD    Examination-Activity Limitations Bathing;Bend;Carry;Dressing;Hygiene/Grooming;Lift;Locomotion Level;Reach Overhead;Stand;Stairs;Squat    Examination-Participation Restrictions Cleaning;Shop;Yard Work;Laundry;Occupation;Meal Prep    Stability/Clinical Decision Making Stable/Uncomplicated    Clinical Decision Making Low    Rehab Potential Good    PT Frequency 1x / week    PT Duration 6 weeks    PT Treatment/Interventions ADLs/Self Care Home Management;Cryotherapy;Electrical Stimulation;Moist Heat;Traction;Balance training;Therapeutic exercise;Therapeutic activities;Functional mobility training;Stair training;Gait training;Ultrasound;Neuromuscular re-education;Patient/family education;Manual techniques;Taping;Energy conservation;Dry needling;Passive range of motion    PT Next Visit Plan  lumbar FOTO; reassess HEP; progress thoracic and lumbar extension ROM, jt mobility, LE flexibility, core strength    Consulted and Agree with Plan of Care Patient           Patient will benefit from skilled therapeutic intervention in order to improve the following deficits and impairments:  Hypomobility,Pain,Increased fascial restricitons,Decreased activity tolerance,Increased muscle spasms,Improper body mechanics,Decreased range of motion,Impaired flexibility,Postural dysfunction  Visit Diagnosis: Chronic midline low back pain without sciatica  Abnormal posture  Other symptoms and signs involving the musculoskeletal system  Other muscle spasm     Problem List Patient Active Problem List   Diagnosis Date Noted  . Aortic atherosclerosis ([place]) 07/31/2020  . Smoker 05/06/2018  . COPD (chronic obstructive pulmonary disease) ([place])   . Diabetes mellitus without complication ([place]) 02/63/7858  . Testosterone deficiency 08/22/2013  . GERD 01/21/2010  . Hypothyroidism 07/20/2009  . Hyperlipidemia associated with type 2 diabetes mellitus ([place]) 07/20/2009     Janene Harvey, PT, DPT 08/10/20 8:58 AM   [facility] [address]  [place], [place], [address] Phone: [phone]   Fax:  [phone]  Name: Tracy Turner MRN: [id number] Date of Birth: 31-Jan-1956

## 2020-08-15 ENCOUNTER — Ambulatory Visit: Payer: Managed Care, Other (non HMO)

## 2020-08-15 ENCOUNTER — Other Ambulatory Visit: Payer: Self-pay

## 2020-08-15 DIAGNOSIS — R293 Abnormal posture: Secondary | ICD-10-CM

## 2020-08-15 DIAGNOSIS — M545 Low back pain, unspecified: Secondary | ICD-10-CM

## 2020-08-15 DIAGNOSIS — G8929 Other chronic pain: Secondary | ICD-10-CM

## 2020-08-15 DIAGNOSIS — M62838 Other muscle spasm: Secondary | ICD-10-CM

## 2020-08-15 DIAGNOSIS — R29898 Other symptoms and signs involving the musculoskeletal system: Secondary | ICD-10-CM

## 2020-08-15 NOTE — Therapy (Signed)
[facility] [address]  [place], [place], [address] Phone: [phone]   Fax:  [phone]  Physical Therapy Treatment  Patient Details  Name: Tracy Turner MRN: [id number] Date of Birth: Jun 15, 1955 Referring Provider (PT): Garret Reddish, MD   Encounter Date: 08/15/2020   PT End of Session - 08/15/20 0858    Visit Number 2    Number of Visits 7    Date for PT Re-Evaluation 09/21/20    Authorization Type Cign[employer]    Authorization Time Period VL- 20    Authorization - Visit Number 1    Authorization - Number of Visits 20    PT Start Time 0800    PT Stop Time 0850    PT Time Calculation (min) 50 min           Past Medical History:  Diagnosis Date  . COPD (chronic obstructive pulmonary disease) (HCC)    mild  . Diabetes mellitus without complication ([place])    more controlled A1C after 30 # weight lost  . GERD (gastroesophageal reflux disease)   . History of colonic polyps   . Hyperlipidemia   . Hypothyroidism   . NONGONOCOCCAL URETHRITIS 10/12/2009   Qualifier: Diagnosis of  By: Burnice Logan  MD, Doretha Sou   . Tobacco abuse     Past Surgical History:  Procedure Laterality Date  . COLONOSCOPY  2008  . ETT  2007  . INGUINAL HERNIA REPAIR Bilateral 05/25/2014   Procedure: BILATERAL LAPAROSCOPIC INGUINAL HERNIA WITH MESH;  Surgeon: Michael Boston, MD;  Location: [facility];  Service: General;  Laterality: Bilateral;  . INSERTION OF MESH Bilateral 05/25/2014   Procedure: INSERTION OF MESH;  Surgeon: Michael Boston, MD;  Location: [facility];  Service: General;  Laterality: Bilateral;  . TOOTH EXTRACTION     several -molars    There were no vitals filed for this visit.   Subjective Assessment - 08/15/20 0802    Subjective Pt reports that the exercises have been helping, Sunday wasn't  able to move too much but better today.    Pertinent History hypothyroidism, HLD, GERD, DM, COPD    Diagnostic tests 07/30/20 lumbar xray:  Moderate multilevel lumbar degenerative disc disease. Minimal lower lumbar retrolisthesis.    Patient Stated Goals improve core strength and decrease pain    Currently in Pain? No/denies              [facility] PT Assessment - 08/15/20 0001      Observation/Other Assessments   Focus on Therapeutic Outcomes (FOTO)  Lumbar: 75; Predicted: 60                         [facility] Adult PT Treatment/Exercise - 08/15/20 0001      Self-Care   Self-Care Other Self-Care Comments   instructed pt on log roll technique     Exercises   Exercises Lumbar      Lumbar Exercises: Stretches   Passive Hamstring Stretch Right;Left;1 rep;30 seconds    Passive Hamstring Stretch Limitations supine with strap    Piriformis Stretch Right;Left;2 reps;30 seconds    Piriformis Stretch Limitations supine KTOS    Figure 4 Stretch 1 rep;30 seconds;Supine;With overpressure      Lumbar Exercises: Aerobic   Nustep L2x59min      Lumbar Exercises: Seated   Other Seated Lumbar Exercises thoracic extension 10X3"      Lumbar Exercises: Supine   Clam 15 reps;3 seconds  Clam Limitations G Tband    Bridge Compliant;10 reps;3 seconds                  PT Education - 08/15/20 [phone]    Education Details Instructed pt on log roll technique to reduce stress on low back when laying down. HEP update: Access Code: [email or web address]    Person(s) Educated Patient    Methods Explanation;Demonstration;Verbal cues;Handout    Comprehension Verbalized understanding;Returned demonstration;Verbal cues required;Need further instruction            PT Short Term Goals - 08/15/20 0911      PT SHORT TERM GOAL #1   Title Patient to be independent with initial HEP.    Time 3    Period Weeks    Status On-going    Target Date 08/31/20             PT Long Term Goals - 08/15/20 0911      PT LONG TERM GOAL #1   Title Patient to be independent with advanced HEP.    Time 6    Period Weeks    Status On-going       PT LONG TERM GOAL #2   Title Patient to demonstrate B hip extension and knee flexion strength >/=4+/5.    Time 6    Period Weeks    Status On-going      PT LONG TERM GOAL #3   Title Patient to demonstrate lumbar AROM WFL and without pain limiting.    Time 6    Period Weeks    Status On-going      PT LONG TERM GOAL #4   Title Patient to demonstrate moderate B piriformis and hip flexor tightness remaining with LE stretching.    Time 6    Period Weeks    Status On-going      PT LONG TERM GOAL #5   Title Patient to demonstrate and recall proper lifting body mechanics with 20lb box from floor to decrease risk of re-injury.    Time 6    Period Weeks    Status On-going                 Plan - 08/15/20 4742    Clinical Impression Statement Reviewed HEP with pt per pt request. He demonstrated a good performance of the exercises with only cues needed to avoid pushing exercises into any pain. He noted that he does not have a lot of functional limitations but has the most trouble when trying to play golf. No complaints during session except mild report of "discomfort" in R low back during bridges during the descend. Pt showed significant tightness in B piriformis and hamstrings. Also palpated taut bands along R thoracic and lumbar paraspinals.    Personal Factors and Comorbidities Age;Comorbidity 3+;Fitness;Past/Current Experience;Profession;Time since onset of injury/illness/exacerbation    Comorbidities hypothyroidism, HLD, GERD, DM, COPD    PT Frequency 1x / week    PT Duration 6 weeks    PT Treatment/Interventions ADLs/Self Care Home Management;Cryotherapy;Electrical Stimulation;Moist Heat;Traction;Balance training;Therapeutic exercise;Therapeutic activities;Functional mobility training;Stair training;Gait training;Ultrasound;Neuromuscular re-education;Patient/family education;Manual techniques;Taping;Energy conservation;Dry needling;Passive range of motion    PT Next Visit Plan STM to  thoracic and lumbar paraspinals, progress thoracic and lumbar extension ROM, jt mobility, LE flexibility, core strength    Consulted and Agree with Plan of Care Patient           Patient will benefit from skilled therapeutic intervention in order to improve the following deficits and impairments:  Hypomobility,Pain,Increased fascial restricitons,Decreased activity tolerance,Increased muscle spasms,Improper body mechanics,Decreased range of motion,Impaired flexibility,Postural dysfunction  Visit Diagnosis: Chronic midline low back pain without sciatica  Abnormal posture  Other symptoms and signs involving the musculoskeletal system  Other muscle spasm     Problem List Patient Active Problem List   Diagnosis Date Noted  . Aortic atherosclerosis ([place]) 07/31/2020  . Smoker 05/06/2018  . COPD (chronic obstructive pulmonary disease) ([place])   . Diabetes mellitus without complication ([place]) 47/01/6282  . Testosterone deficiency 08/22/2013  . GERD 01/21/2010  . Hypothyroidism 07/20/2009  . Hyperlipidemia associated with type 2 diabetes mellitus ([place]) 07/20/2009    Artist Pais, PTA 08/15/2020, 9:15 AM  [facility] [address]  [place], [place], [address] Phone: [phone]   Fax:  [phone]  Name: Tracy Turner MRN: [id number] Date of Birth: 29-Apr-1956

## 2020-08-22 ENCOUNTER — Ambulatory Visit: Payer: Managed Care, Other (non HMO) | Admitting: Physical Therapy

## 2020-08-22 ENCOUNTER — Encounter: Payer: Self-pay | Admitting: Physical Therapy

## 2020-08-22 ENCOUNTER — Other Ambulatory Visit: Payer: Self-pay

## 2020-08-22 DIAGNOSIS — R29898 Other symptoms and signs involving the musculoskeletal system: Secondary | ICD-10-CM

## 2020-08-22 DIAGNOSIS — R293 Abnormal posture: Secondary | ICD-10-CM

## 2020-08-22 DIAGNOSIS — G8929 Other chronic pain: Secondary | ICD-10-CM

## 2020-08-22 DIAGNOSIS — M545 Low back pain, unspecified: Secondary | ICD-10-CM | POA: Diagnosis not present

## 2020-08-22 DIAGNOSIS — M62838 Other muscle spasm: Secondary | ICD-10-CM

## 2020-08-22 NOTE — Therapy (Signed)
[facility] [place] [facility] [address]  [place], [place], [address] Phone: [phone]   Fax:  [phone]  Physical Therapy Treatment  Patient Details  Name: Tracy Turner MRN: [id number] Date of Birth: [birth year] Referring Provider (PT): Garret Reddish, MD   Encounter Date: 08/22/2020   PT End of Session - 08/22/20 0928    Visit Number 3    Number of Visits 7    Date for PT Re-Evaluation 09/21/20    Authorization Type [employer]    Authorization Time Period VL- 20    Authorization - Visit Number 2    Authorization - Number of Visits 20    PT Start Time 0840    PT Stop Time 0924    PT Time Calculation (min) 44 min    Activity Tolerance Patient tolerated treatment well    Behavior During Therapy [facility] for tasks assessed/performed           Past Medical History:  Diagnosis Date  . COPD (chronic obstructive pulmonary disease) (HCC)    mild  . Diabetes mellitus without complication ([place])    more controlled A1C after 30 # weight lost  . GERD (gastroesophageal reflux disease)   . History of colonic polyps   . Hyperlipidemia   . Hypothyroidism   . NONGONOCOCCAL URETHRITIS 10/12/2009   Qualifier: Diagnosis of  By: Burnice Logan  MD, Doretha Sou   . Tobacco abuse     Past Surgical History:  Procedure Laterality Date  . COLONOSCOPY  2008  . ETT  2007  . INGUINAL HERNIA REPAIR Bilateral 05/25/2014   Procedure: BILATERAL LAPAROSCOPIC INGUINAL HERNIA WITH MESH;  Surgeon: Michael Boston, MD;  Location: [facility];  Service: General;  Laterality: Bilateral;  . INSERTION OF MESH Bilateral 05/25/2014   Procedure: INSERTION OF MESH;  Surgeon: Michael Boston, MD;  Location: [facility];  Service: General;  Laterality: Bilateral;  . TOOTH EXTRACTION     several -molars    There were no vitals filed for this visit.   Subjective Assessment - 08/22/20 0841    Subjective Feels that he pulled a muscle in the L hamstring doing clamshells- skipped it one day  and felt better the next time. Feeling better than when he started, but is still having some pain in the R shoulder blade.    Pertinent History hypothyroidism, HLD, GERD, DM, COPD    Diagnostic tests 07/30/20 lumbar xray: Moderate multilevel lumbar degenerative disc disease. Minimal lower lumbar retrolisthesis.    Patient Stated Goals improve core strength and decrease pain    Currently in Pain? Yes    Pain Score 3     Pain Location Back    Pain Orientation --   midline   Pain Descriptors / Indicators Aching    Pain Type Chronic pain                             [facility] Adult PT Treatment/Exercise - 08/22/20 0001      Self-Care   Self-Care Other Self-Care Comments    Other Self-Care Comments  edu and practice on self-STM to R rhomboids and paraspinals      Lumbar Exercises: Aerobic   Recumbent Bike L4 x 6 min      Lumbar Exercises: Standing   Row Strengthening;Both;15 reps;Theraband    Theraband Level (Row) Level 3 (Green)    Row Limitations cues to stop at neutral and promote scap retraction  Shoulder Extension Strengthening;Both;10 reps    Other Standing Lumbar Exercises R/L resisted trunk rotation with green TB 10x each   cues for core contraction and controlled eccentric phase; cues to correct upper/midback rounding     Lumbar Exercises: Supine   Other Supine Lumbar Exercises B horizontal ABD with red TB x10   cueing for scapular retraction without lordosis     Manual Therapy   Manual Therapy Soft tissue mobilization;Myofascial release    Manual therapy comments supine    Soft tissue mobilization STM to R thoracic and lumbar paraspinals and rhomboids    Myofascial Release manual TPR to R thoracic and lumbar paraspinals and rhomboids   taut band of muscle and tender trigger pts throughout                 PT Education - 08/22/20 0927    Education Details update to HEP; review of clamshells for proper form    Person(s) Educated Patient    Methods  Explanation;Demonstration;Tactile cues;Verbal cues;Handout    Comprehension Verbalized understanding;Returned demonstration            PT Short Term Goals - 08/22/20 0931      PT SHORT TERM GOAL #1   Title Patient to be independent with initial HEP.    Time 3    Period Weeks    Status Achieved    Target Date 08/31/20             PT Long Term Goals - 08/15/20 0911      PT LONG TERM GOAL #1   Title Patient to be independent with advanced HEP.    Time 6    Period Weeks    Status On-going      PT LONG TERM GOAL #2   Title Patient to demonstrate B hip extension and knee flexion strength >/=4+/5.    Time 6    Period Weeks    Status On-going      PT LONG TERM GOAL #3   Title Patient to demonstrate lumbar AROM WFL and without pain limiting.    Time 6    Period Weeks    Status On-going      PT LONG TERM GOAL #4   Title Patient to demonstrate moderate B piriformis and hip flexor tightness remaining with LE stretching.    Time 6    Period Weeks    Status On-going      PT LONG TERM GOAL #5   Title Patient to demonstrate and recall proper lifting body mechanics with 20lb box from floor to decrease risk of re-injury.    Time 6    Period Weeks    Status On-going                 Plan - 08/22/20 2878    Clinical Impression Statement Patient arrived to session with report of pulling a muscle in the L hamstring while performing clamshells at home- noting that this has since mostly resolved. Noting improvement in LBP since starting PT, but still noting R periscapular pain. Upon palpation, patient TTP and with soft tissue restriction over the R thoracolumbar paraspinals and rhomboids. Noted good relief from MT. Proceeded to work on periscapular strengthening with cueing for scapular retraction without excessive lumbar lordosis. Patient with good focus and care to correct form according to cues given. Able to perform resisted trunk rotation to simulate golf swing with some  difficulty to correct rounded upper back but with good eccentric control. Patient tolerated  session well and without complaints at end of session.    Personal Factors and Comorbidities Age;Comorbidity 3+;Fitness;Past/Current Experience;Profession;Time since onset of injury/illness/exacerbation    Comorbidities hypothyroidism, HLD, GERD, DM, COPD    PT Frequency 1x / week    PT Duration 6 weeks    PT Treatment/Interventions ADLs/Self Care Home Management;Cryotherapy;Electrical Stimulation;Moist Heat;Traction;Balance training;Therapeutic exercise;Therapeutic activities;Functional mobility training;Stair training;Gait training;Ultrasound;Neuromuscular re-education;Patient/family education;Manual techniques;Taping;Energy conservation;Dry needling;Passive range of motion    PT Next Visit Plan STM to thoracic and lumbar paraspinals, progress thoracic and lumbar extension ROM, jt mobility, LE flexibility, core strength    Consulted and Agree with Plan of Care Patient           Patient will benefit from skilled therapeutic intervention in order to improve the following deficits and impairments:  Hypomobility,Pain,Increased fascial restricitons,Decreased activity tolerance,Increased muscle spasms,Improper body mechanics,Decreased range of motion,Impaired flexibility,Postural dysfunction  Visit Diagnosis: Chronic midline low back pain without sciatica  Abnormal posture  Other symptoms and signs involving the musculoskeletal system  Other muscle spasm     Problem List Patient Active Problem List   Diagnosis Date Noted  . Aortic atherosclerosis ([place]) 07/31/2020  . Smoker 05/06/2018  . COPD (chronic obstructive pulmonary disease) ([place])   . Diabetes mellitus without complication ([place]) 70/26/3785  . Testosterone deficiency 08/22/2013  . GERD 01/21/2010  . Hypothyroidism 07/20/2009  . Hyperlipidemia associated with type 2 diabetes mellitus ([place]) 07/20/2009     Janene Harvey, PT,  DPT 08/22/20 9:32 AM   [facility] [address]  [place], [place], [address] Phone: [phone]   Fax:  [phone]  Name: Eleazar Kimmey MRN: [id number] Date of Birth: [birth year]

## 2020-08-29 ENCOUNTER — Ambulatory Visit: Payer: Managed Care, Other (non HMO) | Admitting: Physical Therapy

## 2020-08-29 ENCOUNTER — Other Ambulatory Visit: Payer: Self-pay

## 2020-08-29 ENCOUNTER — Encounter: Payer: Self-pay | Admitting: Physical Therapy

## 2020-08-29 DIAGNOSIS — M545 Low back pain, unspecified: Secondary | ICD-10-CM | POA: Diagnosis not present

## 2020-08-29 DIAGNOSIS — M62838 Other muscle spasm: Secondary | ICD-10-CM

## 2020-08-29 DIAGNOSIS — R293 Abnormal posture: Secondary | ICD-10-CM

## 2020-08-29 DIAGNOSIS — G8929 Other chronic pain: Secondary | ICD-10-CM

## 2020-08-29 DIAGNOSIS — R29898 Other symptoms and signs involving the musculoskeletal system: Secondary | ICD-10-CM

## 2020-08-29 NOTE — Therapy (Signed)
[facility] [place] [address]  [place], [place], [address] Phone: [phone]   Fax:  [phone]  Physical Therapy Treatment  Patient Details  Name: Tracy Turner MRN: [id number] Date of Birth: 05-31-1955 Referring Provider (PT): Garret Reddish, MD   Encounter Date: 08/29/2020   PT End of Session - 08/29/20 0844    Visit Number 4    Number of Visits 7    Date for PT Re-Evaluation 09/21/20    Authorization Type [employer]    Authorization Time Period VL- 20    Authorization - Visit Number 3    Authorization - Number of Visits 20    PT Start Time 0801    PT Stop Time 2229    PT Time Calculation (min) 43 min    Activity Tolerance Patient tolerated treatment well    Behavior During Therapy [facility] for tasks assessed/performed           Past Medical History:  Diagnosis Date  . COPD (chronic obstructive pulmonary disease) (HCC)    mild  . Diabetes mellitus without complication ([place])    more controlled A1C after 30 # weight lost  . GERD (gastroesophageal reflux disease)   . History of colonic polyps   . Hyperlipidemia   . Hypothyroidism   . NONGONOCOCCAL URETHRITIS 10/12/2009   Qualifier: Diagnosis of  By: Burnice Logan  MD, Doretha Sou   . Tobacco abuse     Past Surgical History:  Procedure Laterality Date  . COLONOSCOPY  2008  . ETT  2007  . INGUINAL HERNIA REPAIR Bilateral 05/25/2014   Procedure: BILATERAL LAPAROSCOPIC INGUINAL HERNIA WITH MESH;  Surgeon: Michael Boston, MD;  Location: [facility];  Service: General;  Laterality: Bilateral;  . INSERTION OF MESH Bilateral 05/25/2014   Procedure: INSERTION OF MESH;  Surgeon: Michael Boston, MD;  Location: [facility];  Service: General;  Laterality: Bilateral;  . TOOTH EXTRACTION     several -molars    There were no vitals filed for this visit.   Subjective Assessment - 08/29/20 0802    Subjective Reports that his muscles are sore but this goes away with a little movement. Notes that  the periscapuilar pain is still there but is improved with his HEP. "We are making improvements."    Pertinent History hypothyroidism, HLD, GERD, DM, COPD    Diagnostic tests 07/30/20 lumbar xray: Moderate multilevel lumbar degenerative disc disease. Minimal lower lumbar retrolisthesis.    Patient Stated Goals improve core strength and decrease pain    Currently in Pain? No/denies                             [facility] Adult PT Treatment/Exercise - 08/29/20 0001      Lumbar Exercises: Stretches   Other Lumbar Stretch Exercise forward prayer stretch 2 sets 5x5" with green pball      Lumbar Exercises: Aerobic   Nustep L4 x 6 min (UEs/LEs)      Lumbar Exercises: Standing   Other Standing Lumbar Exercises thoracic extension at wall 10x3"   cues for form   Other Standing Lumbar Exercises R/L 4 way hip with red TB and 2 ski poles 10x each   intermittent anterior trunk lean, requiring correction; stretching break in between LEs     Lumbar Exercises: Sidelying   Other Sidelying Lumbar Exercises R/L open book stretch 10x3"   cues for form     Lumbar Exercises: Prone  Other Prone Lumbar Exercises prone on elbows 10x3"   cues to maintain hips on mat     Manual Therapy   Manual Therapy Joint mobilization    Manual therapy comments prone    Joint Mobilization central PAs grade III along thoracolumbar spine   hypomobility throughout                 PT Education - 08/29/20 0844    Education Details update to HEP; administered red TB    Person(s) Educated Patient    Methods Explanation;Demonstration;Tactile cues;Verbal cues;Handout    Comprehension Verbalized understanding;Returned demonstration            PT Short Term Goals - 08/22/20 0931      PT SHORT TERM GOAL #1   Title Patient to be independent with initial HEP.    Time 3    Period Weeks    Status Achieved    Target Date 08/31/20             PT Long Term Goals - 08/15/20 0911      PT LONG TERM  GOAL #1   Title Patient to be independent with advanced HEP.    Time 6    Period Weeks    Status On-going      PT LONG TERM GOAL #2   Title Patient to demonstrate B hip extension and knee flexion strength >/=4+/5.    Time 6    Period Weeks    Status On-going      PT LONG TERM GOAL #3   Title Patient to demonstrate lumbar AROM WFL and without pain limiting.    Time 6    Period Weeks    Status On-going      PT LONG TERM GOAL #4   Title Patient to demonstrate moderate B piriformis and hip flexor tightness remaining with LE stretching.    Time 6    Period Weeks    Status On-going      PT LONG TERM GOAL #5   Title Patient to demonstrate and recall proper lifting body mechanics with 20lb box from floor to decrease risk of re-injury.    Time 6    Period Weeks    Status On-going                 Plan - 08/29/20 0845    Clinical Impression Statement Patient reporting "We are making improvements." Still noting pain but reports good benefit/relief with HEP. Worked on gentle lumbopelvic and thoracolumbar ROM at start of session. Patient with visibly limited lumbar rotation ROM, R>L. Patient tolerated thoracolumbar joint mobs with evident hypomobility throughout. Followed with lumbar and thoracic extension-based exercises to encourage motion, which patient tolerated well. Patient performed standing hip strengthening with cueing to correct anterior trunk lean, but overall performing with good focus and control. Patient seemingly with more challenge on R than L LE. Worked on gentle lumbar flexion stretching in between sets d/t c/o mild LB discomfort. Updated HEP with exercises that were well-tolerated today. Patient reported understanding and without complaints at end of session.    Personal Factors and Comorbidities Age;Comorbidity 3+;Fitness;Past/Current Experience;Profession;Time since onset of injury/illness/exacerbation    Comorbidities hypothyroidism, HLD, GERD, DM, COPD    PT  Frequency 1x / week    PT Duration 6 weeks    PT Treatment/Interventions ADLs/Self Care Home Management;Cryotherapy;Electrical Stimulation;Moist Heat;Traction;Balance training;Therapeutic exercise;Therapeutic activities;Functional mobility training;Stair training;Gait training;Ultrasound;Neuromuscular re-education;Patient/family education;Manual techniques;Taping;Energy conservation;Dry needling;Passive range of motion    PT Next Visit Plan  STM to thoracic and lumbar paraspinals, progress thoracic and lumbar extension ROM, jt mobility, LE flexibility, core strength    Consulted and Agree with Plan of Care Patient           Patient will benefit from skilled therapeutic intervention in order to improve the following deficits and impairments:  Hypomobility,Pain,Increased fascial restricitons,Decreased activity tolerance,Increased muscle spasms,Improper body mechanics,Decreased range of motion,Impaired flexibility,Postural dysfunction  Visit Diagnosis: Chronic midline low back pain without sciatica  Abnormal posture  Other symptoms and signs involving the musculoskeletal system  Other muscle spasm     Problem List Patient Active Problem List   Diagnosis Date Noted  . Aortic atherosclerosis ([place]) 07/31/2020  . Smoker 05/06/2018  . COPD (chronic obstructive pulmonary disease) ([place])   . Diabetes mellitus without complication ([place]) 02/77/4128  . Testosterone deficiency 08/22/2013  . GERD 01/21/2010  . Hypothyroidism 07/20/2009  . Hyperlipidemia associated with type 2 diabetes mellitus ([place]) 07/20/2009     Janene Harvey, PT, DPT 08/29/20 8:52 AM   [facility] [address]  [place], [place], [address] Phone: [phone]   Fax:  [phone]  Name: Tracy Turner MRN: [id number] Date of Birth: [birth year]

## 2020-09-05 ENCOUNTER — Other Ambulatory Visit: Payer: Self-pay

## 2020-09-05 ENCOUNTER — Ambulatory Visit: Payer: Managed Care, Other (non HMO) | Attending: Family Medicine

## 2020-09-05 DIAGNOSIS — R293 Abnormal posture: Secondary | ICD-10-CM | POA: Insufficient documentation

## 2020-09-05 DIAGNOSIS — M62838 Other muscle spasm: Secondary | ICD-10-CM | POA: Insufficient documentation

## 2020-09-05 DIAGNOSIS — M545 Low back pain, unspecified: Secondary | ICD-10-CM | POA: Insufficient documentation

## 2020-09-05 DIAGNOSIS — R29898 Other symptoms and signs involving the musculoskeletal system: Secondary | ICD-10-CM | POA: Diagnosis present

## 2020-09-05 DIAGNOSIS — G8929 Other chronic pain: Secondary | ICD-10-CM | POA: Diagnosis present

## 2020-09-05 NOTE — Therapy (Addendum)
[facility] [address]  [address] [place], [place], [address] Phone: [phone]   Fax:  [phone]  Physical Therapy Treatment  Patient Details  Name: Tracy Turner MRN: [id number] Date of Birth: 09/07/1955 Referring Provider (PT): Stephen Hunter, MD   Encounter Date: 09/05/2020   PT End of Session - 09/05/20 0930    Visit Number 5    Number of Visits 7    Date for PT Re-Evaluation 09/21/20    Authorization Type [employer]    Authorization Time Period VL- 20    Authorization - Visit Number 4    Authorization - Number of Visits 20    PT Start Time 0846    PT Stop Time 0929    PT Time Calculation (min) 43 min           Past Medical History:  Diagnosis Date  . COPD (chronic obstructive pulmonary disease) (HCC)    mild  . Diabetes mellitus without complication (HCC)    more controlled A1C after 30 # weight lost  . GERD (gastroesophageal reflux disease)   . History of colonic polyps   . Hyperlipidemia   . Hypothyroidism   . NONGONOCOCCAL URETHRITIS 10/12/2009   Qualifier: Diagnosis of  By: [place]  MD, Peter F   . Tobacco abuse     Past Surgical History:  Procedure Laterality Date  . COLONOSCOPY  2008  . ETT  2007  . INGUINAL HERNIA REPAIR Bilateral 05/25/2014   Procedure: BILATERAL LAPAROSCOPIC INGUINAL HERNIA WITH MESH;  Surgeon: Steven Gross, MD;  Location: [facility];  Service: General;  Laterality: Bilateral;  . INSERTION OF MESH Bilateral 05/25/2014   Procedure: INSERTION OF MESH;  Surgeon: Steven Gross, MD;  Location: [facility];  Service: General;  Laterality: Bilateral;  . TOOTH EXTRACTION     several -molars    There were no vitals filed for this visit.   Subjective Assessment - 09/05/20 0849    Subjective Pt reports not feeling as tight, not a lot of pain, thinks he is ready to wrap up.    Pertinent History hypothyroidism, HLD, GERD, DM, COPD    Diagnostic tests 07/30/20 lumbar xray: Moderate multilevel  lumbar degenerative disc disease. Minimal lower lumbar retrolisthesis.    Patient Stated Goals improve core strength and decrease pain    Currently in Pain? No/denies              [facility] PT Assessment - 09/05/20 0001      AROM   Lumbar Flexion down to distal leg    Lumbar Extension 25% limited    Lumbar - Right Side Bend hands to lateral knee joint line    Lumbar - Left Side Bend hands to lateral knee joint line    Lumbar - Right Rotation WFL    Lumbar - Left Rotation WFL      Strength   Right Hip Flexion 5/5    Right Hip Extension 4+/5    Right Hip External Rotation  4+/5    Right Hip Internal Rotation 4+/5    Right Hip ABduction 4+/5    Right Hip ADduction 4+/5    Left Hip Flexion 5/5    Left Hip Extension 4+/5    Left Hip External Rotation 4+/5    Left Hip Internal Rotation 4+/5    Left Hip ABduction 4+/5    Left Hip ADduction 4+/5    Right Knee Flexion 4+/5    Right Knee Extension 5/5      Left Knee Flexion 4+/5    Left Knee Extension 5/5      Flexibility   Piriformis mod tightness                         [facility] Adult PT Treatment/Exercise - 09/05/20 0001      Exercises   Exercises Lumbar      Lumbar Exercises: Aerobic   Recumbent Bike L3x6min                    PT Short Term Goals - 08/22/20 0931      PT SHORT TERM GOAL #1   Title Patient to be independent with initial HEP.    Time 3    Period Weeks    Status Achieved    Target Date 08/31/20             PT Long Term Goals - 09/05/20 0854      PT LONG TERM GOAL #1   Title Patient to be independent with advanced HEP.    Time 6    Period Weeks    Status Achieved      PT LONG TERM GOAL #2   Title Patient to demonstrate B hip extension and knee flexion strength >/=4+/5.    Time 6    Period Weeks    Status Achieved      PT LONG TERM GOAL #3   Title Patient to demonstrate lumbar AROM WFL and without pain limiting.    Time 6    Period Weeks    Status Partially Met       PT LONG TERM GOAL #4   Title Patient to demonstrate moderate B piriformis and hip flexor tightness remaining with LE stretching.    Time 6    Period Weeks    Status Achieved   hip flexors good flexibility; piriformis mod tight     PT LONG TERM GOAL #5   Title Patient to demonstrate and recall proper lifting body mechanics with 20lb box from floor to decrease risk of re-injury.    Time 6    Period Weeks    Status Achieved                 Plan - 09/05/20 0931    Clinical Impression Statement Pt has met goals for strength. He is I with his ongoing HEP, being able to demonstrate the exercises and verbalize instructions. Lumbar AROM is still limited in certain directions but functional those directions. Tightness noted in B piriformis but hip flexor flexibility looked good. Pt is able to pick up a 20 lb box from the floor with good BM, slight cueing needed to remiain close to the object when trying to pick it up and to widen BOS. Reviewed HEP with him to ensure proper understanding and modified exercises as needed. He will be going on a 30 day hold to try to continue with home program instead of formal D/C.    Personal Factors and Comorbidities Age;Comorbidity 3+;Fitness;Past/Current Experience;Profession;Time since onset of injury/illness/exacerbation    Comorbidities hypothyroidism, HLD, GERD, DM, COPD    PT Frequency 1x / week    PT Duration 6 weeks    PT Treatment/Interventions ADLs/Self Care Home Management;Cryotherapy;Electrical Stimulation;Moist Heat;Traction;Balance training;Therapeutic exercise;Therapeutic activities;Functional mobility training;Stair training;Gait training;Ultrasound;Neuromuscular re-education;Patient/family education;Manual techniques;Taping;Energy conservation;Dry needling;Passive range of motion    PT Next Visit Plan STM to thoracic and lumbar paraspinals, progress thoracic and lumbar extension ROM, jt mobility, LE flexibility, core   strength    Consulted  and Agree with Plan of Care Patient           Patient will benefit from skilled therapeutic intervention in order to improve the following deficits and impairments:  Hypomobility,Pain,Increased fascial restricitons,Decreased activity tolerance,Increased muscle spasms,Improper body mechanics,Decreased range of motion,Impaired flexibility,Postural dysfunction  Visit Diagnosis: Chronic midline low back pain without sciatica  Abnormal posture  Other symptoms and signs involving the musculoskeletal system  Other muscle spasm     Problem List Patient Active Problem List   Diagnosis Date Noted  . Aortic atherosclerosis ([place]) 07/31/2020  . Smoker 05/06/2018  . COPD (chronic obstructive pulmonary disease) ([place])   . Diabetes mellitus without complication ([place]) 08/65/7846  . Testosterone deficiency 08/22/2013  . GERD 01/21/2010  . Hypothyroidism 07/20/2009  . Hyperlipidemia associated with type 2 diabetes mellitus ([place]) 07/20/2009    Artist Pais, PTA 09/05/2020, 10:17 AM  [facility] [address]  [place], [place], [address] Phone: [phone]   Fax:  [phone]  Name: Tracy Turner MRN: [id number] Date of Birth: 1955-12-27  PHYSICAL THERAPY DISCHARGE SUMMARY  Visits from Start of Care: 5  Current functional level related to goals / functional outcomes: See above clinical impression; patient did not return during 30 day hold   Remaining deficits: Limited lumbar ROM   Education / Equipment: HEP  Plan: Patient agrees to discharge.  Patient goals were partially met. Patient is being discharged due to being pleased with the current functional level.  ?????     Janene Harvey, PT, DPT 10/09/20 2:29 PM

## 2020-09-10 LAB — HM DIABETES EYE EXAM

## 2020-09-11 ENCOUNTER — Encounter: Payer: Self-pay | Admitting: Family Medicine

## 2021-05-06 ENCOUNTER — Telehealth: Payer: Self-pay | Admitting: Family Medicine

## 2021-05-08 ENCOUNTER — Other Ambulatory Visit: Payer: Self-pay

## 2021-05-08 MED ORDER — LEVOTHYROXINE SODIUM 150 MCG PO TABS: 150.0000 ug | ORAL_TABLET | Freq: Every day | ORAL | 0 refills | Status: DC

## 2021-05-08 MED ORDER — SIMVASTATIN 40 MG PO TABS: 40.0000 mg | ORAL_TABLET | Freq: Every evening | ORAL | 0 refills | Status: DC

## 2021-05-08 MED ORDER — PANTOPRAZOLE SODIUM 40 MG PO TBEC: 40.0000 mg | DELAYED_RELEASE_TABLET | Freq: Every day | ORAL | 0 refills | Status: DC

## 2021-05-08 MED ORDER — METFORMIN HCL 500 MG PO TABS: ORAL_TABLET | ORAL | 0 refills | Status: DC

## 2021-05-08 NOTE — Progress Notes (Signed)
Phone [phone] In person visit   Subjective:   Tracy Turner is a 66 y.o. year old very pleasant male patient who presents for/with See problem oriented charting Chief Complaint  Patient presents with   Toes going numb    Pt c/o toe numbness and leg pain   Diabetes    Pt c/o that sometimes from his knees down he gets numbness and tingling in lower legs and shooting pains in calf muscles while sitting down.    This visit occurred during the SARS-CoV-2 public health emergency.  Safety protocols were in place, including screening questions prior to the visit, additional usage of staff PPE, and extensive cleaning of exam room while observing appropriate contact time as indicated for disinfecting solutions.   Past Medical History-  Patient Active Problem List   Diagnosis Date Noted   Smoker 05/06/2018    Priority: High   COPD (chronic obstructive pulmonary disease) ([place])     Priority: High   Diabetes mellitus without complication ([place]) 22/29/7989    Priority: High   Hypothyroidism 07/20/2009    Priority: Medium    Hyperlipidemia associated with type 2 diabetes mellitus ([place]) 07/20/2009    Priority: Medium    Testosterone deficiency 08/22/2013    Priority: Low   GERD 01/21/2010    Priority: Low   Aortic atherosclerosis ([place]) 07/31/2020    Medications- reviewed and updated Current Outpatient Medications  Medication Sig Dispense Refill   albuterol (VENTOLIN HFA) 108 (90 Base) MCG/ACT inhaler Inhale 2 puffs into the lungs every 6 (six) hours as needed for wheezing or shortness of breath. 1 each 2   aspirin EC 81 MG tablet Take 81 mg by mouth daily.     levothyroxine (SYNTHROID) 150 MCG tablet Take 1 tablet (150 mcg total) by mouth daily. 90 tablet 0   metFORMIN (GLUCOPHAGE) 500 MG tablet TAKE 1 TABLET BY MOUTH  TWICE DAILY 180 tablet 0   Multiple Vitamins-Minerals (MENS 50+ MULTI VITAMIN/MIN PO) Take 1 tablet by mouth daily.     pantoprazole (PROTONIX) 40 MG tablet Take 1  tablet (40 mg total) by mouth daily. 90 tablet 0   sildenafil (VIAGRA) 100 MG tablet Take 0.5 tablets (50 mg total) by mouth as needed for erectile dysfunction. 30 tablet 6   simvastatin (ZOCOR) 40 MG tablet Take 1 tablet (40 mg total) by mouth every evening. 90 tablet 0   tizanidine (ZANAFLEX) 2 MG capsule Take 1 capsule (2 mg total) by mouth 3 (three) times daily as needed for muscle spasms (do not drive for 8 hours after using). 30 capsule 0   varenicline (CHANTIX CONTINUING MONTH PAK) 1 MG tablet Take 1 tablet (1 mg total) by mouth 2 (two) times daily. 60 tablet 2   No current facility-administered medications for this visit.     Objective:  BP 100/74    Pulse (!) 111    Temp 99.1 F (37.3 C)    Ht 5\' 11"  (1.803 m)    Wt 199 lb 12.8 oz (90.6 kg)    SpO2 94%    BMI 27.87 kg/m  Gen: NAD, resting comfortably CV: RRR no murmurs rubs or gallops Lungs: CTAB no crackles, wheeze, rhonchi Ext: no edema Skin: warm, dry Neuro: grossly normal, moves all extremities Pulses 2+ pt and DP- high normal rate     Assessment and Plan   # Toes Numbness/ Leg pain  S:Pt reported symptoms started a couple months ago.  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  A/P: With paresthesias we will check B12 levels especially with long-term metformin use-strongly suspect diabetic neuropathy though.  Discussed option of medication .  Also check thyroid levels which can be asociated with neuropathy. -also check ABIs with long term smoker- given some rest pain to be on safe side  # Diabetes S: Medication:metformin 500mg  twice daily CBGs- no recent checks Exercise and diet- exercise down due to new jo Lab Results  Component Value Date   HGBA1C 7.1 (H) 05/25/2020   HGBA1C 6.4 11/08/2018   HGBA1C 6.5  05/06/2018   A/P: Hopefully stable or improved-update A1c with labs today. For now continue curren tmeds  #hypothyroidism S: compliant On thyroid medication-levothyroxine 137 mcg daily Lab Results  Component Value Date   TSH 3.94 07/09/2020   A/P:hopefully stable- update tsh today. Continue current meds for now     #hyperlipidemia #aortic atherosclerosis S: Medication:simvastatin 40mg  every evening Lab Results  Component Value Date   CHOL 164 05/25/2020   HDL 41.30 05/25/2020   LDLCALC 76 05/06/2018   LDLDIRECT 98.0 05/25/2020   TRIG 213.0 (H) 05/25/2020   CHOLHDL 4 05/25/2020   A/P: hopefully stable or improved- update lipid panel today. Continue current meds for now. LDL goal 70 or less with atherosclerosis- hopefully stable  #Smoker S:1 PPD previously. He is on chantix still along with 1 cigarette per day. Wife still smoking it is hard.  A/P: will continue chantix but want to see full cessation from smoking   # COPD S: patient wheezed occasionally in past- better- not having to use since cutting back on cigarettes in october.  A/P: largely asymptomatic- encouraged cessation as seeing improvement with quitting smoking  Recommended follow up: keep June physical Future Appointments  Date Time Provider [place]  10/08/2021  2:40 PM Marin Olp, MD [facility]    Lab/Order associations: waffle with sugar free syrup and 2 strips of bacon at 7 30 am   ICD-10-CM   1. Diabetes mellitus without complication (HCC)  H74.1 Hemoglobin A1c    Microalbumin / creatinine urine ratio    2. Hyperlipidemia associated with type 2 diabetes mellitus (HCC)  E11.69 CBC with Differential/Platelet   E78.5 Comprehensive metabolic panel    Lipid panel    3. Chronic obstructive pulmonary disease, unspecified COPD type ([place])  J44.9     4. Numbness of toes  R20.0     5. Pain of lower extremity, unspecified laterality  M79.606     6. Smoker  F17.200     7. Acquired  hypothyroidism  E03.9 TSH    8. Bilateral calf pain  M79.661 VAS [place] ABI WITH/WO TBI   M79.662     9. High risk medication use  Z79.899 B12      No orders of the defined types were placed in this encounter.   I,Jada Bradford,acting as a scribe for Garret Reddish, MD.,have documented all relevant documentation on the behalf of Garret Reddish, MD,as directed by  Garret Reddish, MD while in the presence of Garret Reddish, MD.  I, Garret Reddish, MD, have reviewed all documentation for this visit. The documentation on 05/13/21 for the exam, diagnosis, procedures, and orders are all accurate and complete.  Return precautions advised.  Garret Reddish, MD

## 2021-05-08 NOTE — Telephone Encounter (Signed)
Patient is scheduled for 05/13/21.

## 2021-05-08 NOTE — Telephone Encounter (Signed)
Medications refilled

## 2021-05-08 NOTE — Patient Instructions (Addendum)
We will call you within two weeks about your referral for blood flow screening for legs. If you do not hear within 2 weeks, give [place] a call.   Please stop by lab before you go If you have mychart- we will send your results within 3 business days of [place] receiving them.  If you do not have mychart- we will call you about results within 5 business days of [place] receiving them.  *please also note that you will see labs on mychart as soon as they post. I will later go in and write notes on them- will say "notes from Dr. Yong Channel"   Recommended follow up: keep June physical

## 2021-05-13 ENCOUNTER — Other Ambulatory Visit: Payer: Self-pay

## 2021-05-13 ENCOUNTER — Encounter: Payer: Self-pay | Admitting: Family Medicine

## 2021-05-13 ENCOUNTER — Ambulatory Visit: Payer: Managed Care, Other (non HMO) | Admitting: Family Medicine

## 2021-05-13 VITALS — BP 100/74 | HR 96 | Temp 99.1°F | Ht 71.0 in | Wt 199.8 lb

## 2021-05-13 DIAGNOSIS — E785 Hyperlipidemia, unspecified: Secondary | ICD-10-CM

## 2021-05-13 DIAGNOSIS — R200 Anesthesia of skin: Secondary | ICD-10-CM | POA: Diagnosis not present

## 2021-05-13 DIAGNOSIS — M79606 Pain in leg, unspecified: Secondary | ICD-10-CM

## 2021-05-13 DIAGNOSIS — Z79899 Other long term (current) drug therapy: Secondary | ICD-10-CM

## 2021-05-13 DIAGNOSIS — E119 Type 2 diabetes mellitus without complications: Secondary | ICD-10-CM

## 2021-05-13 DIAGNOSIS — F17200 Nicotine dependence, unspecified, uncomplicated: Secondary | ICD-10-CM

## 2021-05-13 DIAGNOSIS — J449 Chronic obstructive pulmonary disease, unspecified: Secondary | ICD-10-CM | POA: Diagnosis not present

## 2021-05-13 DIAGNOSIS — E1169 Type 2 diabetes mellitus with other specified complication: Secondary | ICD-10-CM | POA: Diagnosis not present

## 2021-05-13 DIAGNOSIS — E039 Hypothyroidism, unspecified: Secondary | ICD-10-CM

## 2021-05-13 DIAGNOSIS — I700 Atherosclerosis of aorta: Secondary | ICD-10-CM

## 2021-05-13 DIAGNOSIS — M79661 Pain in right lower leg: Secondary | ICD-10-CM

## 2021-05-13 DIAGNOSIS — M79662 Pain in left lower leg: Secondary | ICD-10-CM

## 2021-05-14 LAB — LIPID PANEL
Cholesterol: 154 mg/dL (ref 0–200)
HDL: 37.2 mg/dL — ABNORMAL LOW (ref >39.00)
LDL Cholesterol: 83 mg/dL (ref 0–99)
NonHDL: 117.01
Total CHOL/HDL Ratio: 4
Triglycerides: 172 mg/dL — ABNORMAL HIGH (ref 0.0–149.0)
VLDL: 34.4 mg/dL (ref 0.0–40.0)

## 2021-05-14 LAB — CBC WITH DIFFERENTIAL/PLATELET
Basophils Absolute: 0.1 K/uL (ref 0.0–0.1)
Basophils Relative: 0.8 % (ref 0.0–3.0)
Eosinophils Absolute: 0.1 K/uL (ref 0.0–0.7)
Eosinophils Relative: 0.8 % (ref 0.0–5.0)
HCT: 44 % (ref 39.0–52.0)
Hemoglobin: 14.6 g/dL (ref 13.0–17.0)
Lymphocytes Relative: 10.6 % — ABNORMAL LOW (ref 12.0–46.0)
Lymphs Abs: 0.9 K/uL (ref 0.7–4.0)
MCHC: 33.3 g/dL (ref 30.0–36.0)
MCV: 91 fl (ref 78.0–100.0)
Monocytes Absolute: 0.8 K/uL (ref 0.1–1.0)
Monocytes Relative: 9.6 % (ref 3.0–12.0)
Neutro Abs: 6.8 K/uL (ref 1.4–7.7)
Neutrophils Relative %: 78.2 % — ABNORMAL HIGH (ref 43.0–77.0)
Platelets: 307 K/uL (ref 150.0–400.0)
RBC: 4.84 Mil/uL (ref 4.22–5.81)
RDW: 12.9 % (ref 11.5–15.5)
WBC: 8.7 K/uL (ref 4.0–10.5)

## 2021-05-14 LAB — MICROALBUMIN / CREATININE URINE RATIO
Creatinine,U: 259.9 mg/dL
Microalb Creat Ratio: 0.7 mg/g (ref 0.0–30.0)
Microalb, Ur: 1.9 mg/dL (ref 0.0–1.9)

## 2021-05-14 LAB — COMPREHENSIVE METABOLIC PANEL
ALT: 19 U/L (ref 0–53)
AST: 15 U/L (ref 0–37)
Albumin: 4.8 g/dL (ref 3.5–5.2)
Alkaline Phosphatase: 62 U/L (ref 39–117)
BUN: 22 mg/dL (ref 6–23)
CO2: 27 mEq/L (ref 19–32)
Calcium: 10.4 mg/dL (ref 8.4–10.5)
Chloride: 100 mEq/L (ref 96–112)
Creatinine, Ser: 1.39 mg/dL (ref 0.40–1.50)
GFR: 53.39 mL/min — ABNORMAL LOW (ref >60.00)
Glucose, Bld: 89 mg/dL (ref 70–99)
Potassium: 5.2 mEq/L — ABNORMAL HIGH (ref 3.5–5.1)
Sodium: 138 mEq/L (ref 135–145)
Total Bilirubin: 0.5 mg/dL (ref 0.2–1.2)
Total Protein: 7.5 g/dL (ref 6.0–8.3)

## 2021-05-14 LAB — VITAMIN B12: Vitamin B-12: 649 pg/mL (ref 211–911)

## 2021-05-14 LAB — HEMOGLOBIN A1C: Hgb A1c MFr Bld: 7.4 % — ABNORMAL HIGH (ref 4.6–6.5)

## 2021-05-14 LAB — TSH: TSH: 3.49 u[IU]/mL (ref 0.35–5.50)

## 2021-05-15 ENCOUNTER — Telehealth: Payer: Self-pay | Admitting: Family Medicine

## 2021-05-15 ENCOUNTER — Other Ambulatory Visit: Payer: Self-pay

## 2021-05-15 MED ORDER — METFORMIN HCL 1000 MG PO TABS: ORAL_TABLET | ORAL | 3 refills | Status: DC

## 2021-05-15 MED ORDER — ATORVASTATIN CALCIUM 40 MG PO TABS: 40.0000 mg | ORAL_TABLET | Freq: Every day | ORAL | 3 refills | Status: DC

## 2021-05-15 NOTE — Telephone Encounter (Signed)
Atorvastatin sent to pharmacy.

## 2021-05-15 NOTE — Telephone Encounter (Signed)
Pt called and said that hes fine with switching the simvastatin to the atorvastatin if that's what Dr Yong Channel reccomends

## 2021-05-17 ENCOUNTER — Other Ambulatory Visit: Payer: Self-pay

## 2021-05-17 ENCOUNTER — Ambulatory Visit (HOSPITAL_COMMUNITY)
Admission: RE | Admit: 2021-05-17 | Discharge: 2021-05-17 | Disposition: A | Discharge status: Home / Self Care | Payer: Managed Care, Other (non HMO) | Source: Ambulatory Visit | Attending: Family Medicine | Admitting: Family Medicine

## 2021-05-17 DIAGNOSIS — M79661 Pain in right lower leg: Secondary | ICD-10-CM | POA: Diagnosis present

## 2021-05-17 DIAGNOSIS — M79662 Pain in left lower leg: Secondary | ICD-10-CM | POA: Insufficient documentation

## 2021-05-17 NOTE — Progress Notes (Signed)
ABI has been completed.   Preliminary results in CV Proc.   Tracy Turner 05/17/2021 9:54 AM

## 2021-06-11 ENCOUNTER — Encounter: Payer: Self-pay | Admitting: Family Medicine

## 2021-06-12 ENCOUNTER — Encounter: Payer: Self-pay | Admitting: Family Medicine

## 2021-06-12 ENCOUNTER — Ambulatory Visit: Payer: Managed Care, Other (non HMO) | Admitting: Family Medicine

## 2021-06-12 ENCOUNTER — Other Ambulatory Visit: Payer: Self-pay

## 2021-06-12 VITALS — BP 136/78 | HR 89 | Temp 98.8°F | Ht 71.0 in | Wt 202.2 lb

## 2021-06-12 DIAGNOSIS — H6983 Other specified disorders of Eustachian tube, bilateral: Secondary | ICD-10-CM | POA: Diagnosis not present

## 2021-06-12 DIAGNOSIS — R0989 Other specified symptoms and signs involving the circulatory and respiratory systems: Secondary | ICD-10-CM | POA: Diagnosis not present

## 2021-06-12 DIAGNOSIS — R490 Dysphonia: Secondary | ICD-10-CM | POA: Diagnosis not present

## 2021-06-12 DIAGNOSIS — Z87891 Personal history of nicotine dependence: Secondary | ICD-10-CM

## 2021-06-12 DIAGNOSIS — F17210 Nicotine dependence, cigarettes, uncomplicated: Secondary | ICD-10-CM

## 2021-06-12 LAB — POC COVID19 BINAXNOW: SARS Coronavirus 2 Ag: NEGATIVE

## 2021-06-12 MED ORDER — PREDNISONE 20 MG PO TABS: 40.0000 mg | ORAL_TABLET | Freq: Every day | ORAL | 0 refills | Status: AC

## 2021-06-12 MED ORDER — AMOXICILLIN 875 MG PO TABS: 875.0000 mg | ORAL_TABLET | Freq: Two times a day (BID) | ORAL | 0 refills | Status: DC

## 2021-06-12 NOTE — Progress Notes (Signed)
Subjective:     Patient ID: Tracy Turner, male    DOB: August 23, 1955, 66 y.o.   MRN: [id number]  Chief Complaint  Patient presents with   Scratchy Throat    Started 5 weeks ago, phlegm in throat    Fatigue   Ear issue    Ears feel clogged up at times, off and on for 5 weeks     HPI Chief complaint: ears clogged Symptom onset: 5 wks ago Pertinent positives: fatigue, scratchy throat, drainage in throat.  Intermitt ears feeling "clogged" and can't hear out of them, but then "clears"  and returns.  No runny nose.  Not sick. Quit smoking 4 wks ago. Started quitting smoking in Oct but final cig on 1/12.  Quit chantix few days ago.  Pertinent negatives: no f/c/sob/cough. No dysphagia Treatments tried: no meds Vaccine status: none Sick exposure: none   Occ redness in face.intermitt-anytime of day.   Health Maintenance Due  Topic Date Due   Zoster Vaccines- Shingrix (1 of 2) Never done   Pneumonia Vaccine 30+ Years old (2 - PCV) 05/07/2019   FOOT EXAM  05/25/2021    Past Medical History:  Diagnosis Date   COPD (chronic obstructive pulmonary disease) ([place])    mild   Diabetes mellitus without complication ([place])    more controlled A1C after 30 # weight lost   GERD (gastroesophageal reflux disease)    History of colonic polyps    Hyperlipidemia    Hypothyroidism    NONGONOCOCCAL URETHRITIS 10/12/2009   Qualifier: Diagnosis of  By: Burnice Logan  MD, Doretha Sou    Tobacco abuse     Past Surgical History:  Procedure Laterality Date   COLONOSCOPY  2008   ETT  2007   INGUINAL HERNIA REPAIR Bilateral 05/25/2014   Procedure: BILATERAL LAPAROSCOPIC INGUINAL HERNIA WITH MESH;  Surgeon: Michael Boston, MD;  Location: [facility];  Service: General;  Laterality: Bilateral;   INSERTION OF MESH Bilateral 05/25/2014   Procedure: INSERTION OF MESH;  Surgeon: Michael Boston, MD;  Location: [facility];  Service: General;  Laterality: Bilateral;   TOOTH EXTRACTION     several -molars    Outpatient  Medications Prior to Visit  Medication Sig Dispense Refill   aspirin EC 81 MG tablet Take 81 mg by mouth daily.     atorvastatin (LIPITOR) 40 MG tablet Take 1 tablet (40 mg total) by mouth daily. 90 tablet 3   levothyroxine (SYNTHROID) 150 MCG tablet Take 1 tablet (150 mcg total) by mouth daily. 90 tablet 0   metFORMIN (GLUCOPHAGE) 1000 MG tablet TAKE 1 TABLET BY MOUTH  TWICE DAILY 180 tablet 3   Multiple Vitamins-Minerals (MENS 50+ MULTI VITAMIN/MIN PO) Take 1 tablet by mouth daily.     pantoprazole (PROTONIX) 40 MG tablet Take 1 tablet (40 mg total) by mouth daily. 90 tablet 0   sildenafil (VIAGRA) 100 MG tablet Take 0.5 tablets (50 mg total) by mouth as needed for erectile dysfunction. 30 tablet 6   simvastatin (ZOCOR) 40 MG tablet Take 1 tablet (40 mg total) by mouth every evening. 90 tablet 0   varenicline (CHANTIX CONTINUING MONTH PAK) 1 MG tablet Take 1 tablet (1 mg total) by mouth 2 (two) times daily. (Patient not taking: Reported on 06/12/2021) 60 tablet 2   varenicline (CHANTIX PAK) 0.5 MG X 11 & 1 MG X 42 tablet Take by mouth. (Patient not taking: Reported on 06/12/2021)     No facility-administered medications prior to visit.    No  Known Allergies YWV:PXTGGYIR/[id number] except as noted in HPI      Objective:     BP 136/78    Pulse 89    Temp 98.8 F (37.1 C) (Temporal)    Ht 5\' 11"  (1.803 m)    Wt 202 lb 4 oz (91.7 kg)    SpO2 96%    BMI 28.21 kg/m  Wt Readings from Last 3 Encounters:  06/12/21 202 lb 4 oz (91.7 kg)  05/13/21 199 lb 12.8 oz (90.6 kg)  07/30/20 206 lb 9.6 oz (93.7 kg)        Gen: WDWN NAD HEENT: NCAT, conjunctiva not injected, sclera nonicteric TM WNL B, OP moist, no exudates.  Hoarse/raspy NECK:  supple, no thyromegaly, no nodes, no carotid bruits CARDIAC: RRR, S1S2+, no murmur. DP 2+B LUNGS: CTAB. No wheezes EXT:  no edema MSK: no gross abnormalities.  NEURO: A&O x3.  CN II-XII intact.  PSYCH: normal mood. Good eye contact  Covid  neg Results for orders placed or performed in visit on 06/12/21  POC COVID-19  Result Value Ref Range   SARS Coronavirus 2 Ag Negative Negative         Assessment & Plan:   Problem List Items Addressed This Visit   None Visit Diagnoses     Eustachian tube dysfunction, bilateral    -  Primary   Phlegm in throat       Relevant Orders   POC COVID-19   Hoarseness of voice          ETD/hoarseness-?drainage.  Could be blockage as well GERD, other.  Will do abx and pred.  If not improved, ENT.    Meds ordered this encounter  Medications   amoxicillin (AMOXIL) 875 MG tablet    Sig: Take 1 tablet (875 mg total) by mouth 2 (two) times daily.    Dispense:  14 tablet    Refill:  0   predniSONE (DELTASONE) 20 MG tablet    Sig: Take 2 tablets (40 mg total) by mouth daily with breakfast for 5 days.    Dispense:  10 tablet    Refill:  0    Wellington Hampshire, MD

## 2021-06-12 NOTE — Patient Instructions (Signed)
Meds have been sent the the pharmacy °You can take tylenol for pain/fevers °If worsening symptoms, let us know or go to the Emergency room  ° ° °

## 2021-06-12 NOTE — Telephone Encounter (Signed)
Mychart message sent by pt stating that he wants to schedule his yearly CT. Routing to lung nodule pool for follow up.

## 2021-06-20 ENCOUNTER — Encounter: Payer: Self-pay | Admitting: Family Medicine

## 2021-07-03 ENCOUNTER — Ambulatory Visit (HOSPITAL_BASED_OUTPATIENT_CLINIC_OR_DEPARTMENT_OTHER): Payer: Managed Care, Other (non HMO)

## 2021-07-10 ENCOUNTER — Other Ambulatory Visit: Payer: Self-pay

## 2021-07-10 ENCOUNTER — Ambulatory Visit
Admission: RE | Admit: 2021-07-10 | Discharge: 2021-07-10 | Disposition: A | Discharge status: Home / Self Care | Payer: Managed Care, Other (non HMO) | Source: Ambulatory Visit | Attending: Acute Care | Admitting: Acute Care

## 2021-07-10 DIAGNOSIS — F17210 Nicotine dependence, cigarettes, uncomplicated: Secondary | ICD-10-CM

## 2021-07-11 ENCOUNTER — Other Ambulatory Visit: Payer: Self-pay

## 2021-07-11 DIAGNOSIS — Z87891 Personal history of nicotine dependence: Secondary | ICD-10-CM

## 2021-07-11 DIAGNOSIS — F17210 Nicotine dependence, cigarettes, uncomplicated: Secondary | ICD-10-CM

## 2021-08-13 ENCOUNTER — Other Ambulatory Visit: Payer: Self-pay | Admitting: Family Medicine

## 2021-10-08 ENCOUNTER — Ambulatory Visit (INDEPENDENT_AMBULATORY_CARE_PROVIDER_SITE_OTHER): Payer: Managed Care, Other (non HMO) | Admitting: Family Medicine

## 2021-10-08 ENCOUNTER — Encounter: Payer: Self-pay | Admitting: Family Medicine

## 2021-10-08 VITALS — BP 110/70 | HR 73 | Temp 98.5°F | Ht 71.0 in | Wt 207.0 lb

## 2021-10-08 DIAGNOSIS — J449 Chronic obstructive pulmonary disease, unspecified: Secondary | ICD-10-CM

## 2021-10-08 DIAGNOSIS — E1169 Type 2 diabetes mellitus with other specified complication: Secondary | ICD-10-CM | POA: Diagnosis not present

## 2021-10-08 DIAGNOSIS — E785 Hyperlipidemia, unspecified: Secondary | ICD-10-CM

## 2021-10-08 DIAGNOSIS — E039 Hypothyroidism, unspecified: Secondary | ICD-10-CM

## 2021-10-08 DIAGNOSIS — F17200 Nicotine dependence, unspecified, uncomplicated: Secondary | ICD-10-CM

## 2021-10-08 DIAGNOSIS — Z125 Encounter for screening for malignant neoplasm of prostate: Secondary | ICD-10-CM

## 2021-10-08 DIAGNOSIS — E119 Type 2 diabetes mellitus without complications: Secondary | ICD-10-CM

## 2021-10-08 DIAGNOSIS — I700 Atherosclerosis of aorta: Secondary | ICD-10-CM

## 2021-10-08 DIAGNOSIS — Z0000 Encounter for general adult medical examination without abnormal findings: Secondary | ICD-10-CM

## 2021-10-08 DIAGNOSIS — Z79899 Other long term (current) drug therapy: Secondary | ICD-10-CM

## 2021-10-08 NOTE — Patient Instructions (Addendum)
Schedule your diabetic eye exam and have results sent to [place] at [phone].  Consider prevnar 20- can call back for nurse visit or do at pharmacy  Schedule a lab visit at the check out desk within 2 weeks. Return for future fasting labs meaning nothing but water after midnight please. Ok to take your medications with water.    Recommended follow up: Return in about 6 months (around 04/09/2022) for followup or sooner if needed.Schedule b4 you leave.  - 4 months if a1c gets too high

## 2021-10-08 NOTE — Progress Notes (Signed)
Phone: [phone]   Subjective:  Patient presents today for their annual physical. Chief complaint-noted.   See problem oriented charting- ROS- full  review of systems was completed and negative  except for: cough, feet numbness when started back smoking,  back pain, voice change when started back smoking  The following were reviewed and entered/updated in epic: Past Medical History:  Diagnosis Date   COPD (chronic obstructive pulmonary disease) ([place])    mild   Diabetes mellitus without complication ([place])    more controlled A1C after 30 # weight lost   GERD (gastroesophageal reflux disease)    History of colonic polyps    Hyperlipidemia    Hypothyroidism    NONGONOCOCCAL URETHRITIS 10/12/2009   Qualifier: Diagnosis of  By: Burnice Logan  MD, Doretha Sou    Tobacco abuse    Patient Active Problem List   Diagnosis Date Noted   Smoker 05/06/2018    Priority: High   COPD (chronic obstructive pulmonary disease) ([place])     Priority: High   Diabetes mellitus without complication ([place]) 47/65/4650    Priority: High   Hypothyroidism 07/20/2009    Priority: Medium    Hyperlipidemia associated with type 2 diabetes mellitus ([place]) 07/20/2009    Priority: Medium    Testosterone deficiency 08/22/2013    Priority: Low   GERD 01/21/2010    Priority: Low   Aortic atherosclerosis ([place]) 07/31/2020   Past Surgical History:  Procedure Laterality Date   COLONOSCOPY  2008   ETT  2007   [place] Bilateral 05/25/2014   Procedure: BILATERAL LAPAROSCOPIC INGUINAL HERNIA WITH MESH;  Surgeon: Michael Boston, MD;  Location: [facility];  Service: General;  Laterality: Bilateral;   INSERTION OF MESH Bilateral 05/25/2014   Procedure: INSERTION OF MESH;  Surgeon: Michael Boston, MD;  Location[facility];  Service: General;  Laterality: Bilateral;   TOOTH EXTRACTION     several -molars    Family History  Problem Relation Age of Onset   Heart disease Mother    Dementia Mother        on hospice early  49-died from COVID-21 in rehab-got sick from nurse   Heart disease Father        died at 32   Stroke Father    Hyperlipidemia Father    Obesity Sister        unknown cause of death- possible cardiac   Drug abuse Sister    Hypertension Sister    Hypertension Brother    Colon cancer Neg Hx     Medications- reviewed and updated Current Outpatient Medications  Medication Sig Dispense Refill   aspirin EC 81 MG tablet Take 81 mg by mouth daily.     atorvastatin (LIPITOR) 40 MG tablet Take 1 tablet (40 mg total) by mouth daily. 90 tablet 3   levothyroxine (SYNTHROID) 150 MCG tablet TAKE 1 TABLET DAILY 90 tablet 3   metFORMIN (GLUCOPHAGE) 1000 MG tablet TAKE 1 TABLET BY MOUTH  TWICE DAILY 180 tablet 3   Multiple Vitamins-Minerals (MENS 50+ MULTI VITAMIN/MIN PO) Take 1 tablet by mouth daily.     pantoprazole (PROTONIX) 40 MG tablet TAKE 1 TABLET DAILY 90 tablet 3   sildenafil (VIAGRA) 100 MG tablet Take 0.5 tablets (50 mg total) by mouth as needed for erectile dysfunction. 30 tablet 6   No current facility-administered medications for this visit.    Allergies-reviewed and updated No Known Allergies  Social History   Social History Narrative   Married. 3 children. 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    Prior landscaping as a side business      Hobbies: golf   Objective  Objective:  BP 110/70   Pulse 73   Temp 98.5 F (36.9 C)   Ht '5\' 11"'$  (1.803 m)   Wt 207 lb (93.9 kg)   SpO2 96%   BMI 28.87 kg/m  Gen: NAD, resting comfortably HEENT: Mucous membranes are moist. Oropharynx normal Neck: no thyromegaly CV: RRR no murmurs rubs or gallops Lungs: CTAB no crackles, wheeze, rhonchi Abdomen: soft/nontender/nondistended/normal bowel sounds. No rebound or guarding.  Ext: no edema Skin: warm, dry Neuro: grossly normal, moves all extremities, PERRLA  Diabetic Foot Exam - Simple   Simple Foot Form Diabetic Foot  exam was performed with the following findings: Yes 10/08/2021  3:57 PM  Visual Inspection No deformities, no ulcerations, no other skin breakdown bilaterally: Yes Sensation Testing Intact to touch and monofilament testing bilaterally: Yes Pulse Check Posterior Tibialis and Dorsalis pulse intact bilaterally: Yes Comments      Assessment and Plan  66 y.o. male presenting for annual physical.  Health Maintenance counseling: 1. Anticipatory guidance: Patient counseled regarding regular dental exams - advised q6 months, eye exams - just got reminder for visit,  avoiding smoking and second hand smoke- see below , limiting alcohol to 2 beverages per day - rarel drinks, no illicit drugs.   2. Risk factor reduction:  Advised patient of need for regular exercise and diet rich and fruits and vegetables to reduce risk of heart attack and stroke.  Exercise- tough with work- sitting behind cpu- some activity at home but no regular exercise.  Diet/weight management-mild weight gain over last few months- feels like inactivity related but discussed dietary changes- more fruits/veggies Wt Readings from Last 3 Encounters:  10/08/21 207 lb (93.9 kg)  06/12/21 202 lb 4 oz (91.7 kg)  05/13/21 199 lb 12.8 oz (90.6 kg)  3. Immunizations/screenings/ancillary studies- opts out of shingrix, wants to hold on covid vaccine Immunization History  Administered Date(s) Administered   Influenza Inj Mdck Quad Pf 03/16/2018   Influenza Split 03/06/2011   Influenza, Quadrivalent, Recombinant, Inj, Pf 02/27/2019   Influenza,inj,Quad PF,6+ Mos 04/04/2013, 04/01/2017   Influenza-Unspecified 02/02/2014, 02/27/2015, 02/07/2016, 02/16/2018   Pneumococcal Polysaccharide-23 05/06/2018   Tdap 07/29/2013   4. Prostate cancer screening- low risk trend- psa today to monitor  Lab Results  Component Value Date   PSA 0.74 05/25/2020   PSA 1.12 05/06/2018   PSA 0.95 04/01/2017   5. Colon cancer screening - 04/08/2017 with 10 year  repeat 6. Skin cancer screening- occasional dermatology vistis. advised regular sunscreen use. Denies worrisome, changing, or new skin lesions.  7. Smoking associated screening (lung cancer screening, AAA screen 65-75, UA)- current smoker- get ua 8. STD screening - only active with wife     Status of chronic or acute concerns   #social update- wife with breast cancer but fortunately did well with treatments- radiation upcoming  #smoking- had quit but lost sister recently and started back. Also notes some tingling in right foot since restarting- he plans to try to quit again  -has some old chantix may use  # Diabetes S: Medication:increased last visit to metformin 1000 mg BID. tolerating CBGs- doesn't check Lab Results  Component Value Date   HGBA1C 7.4 (H) 05/13/2021   HGBA1C 7.1 (H) 05/25/2020   HGBA1C 6.4 11/08/2018  A/P: hopefully improved- update a1c today. Continue current meds for now  # COPD - mild copd S: Patient reports no shortness of breath. Mild occasional cough with smoking Maintenance medications: none. Not on albuterol A/P: mild and stable- continue to monitor  #hypothyroidism S: compliant On thyroid medication- levothyroxine 150 mcg Lab Results  Component Value Date   TSH 3.49 05/13/2021   A/P:hopefully stable- update tsh today. Continue current meds for now    #hyperlipidemia with aortic atherosclerosis (presumed stable) S: Medication: asa 81 mg, atorvastatin 40 mg   - increased last visit Lab Results  Component Value Date   CHOL 154 05/13/2021   HDL 37.20 (L) 05/13/2021   LDLCALC 83 05/13/2021   LDLDIRECT 98.0 05/25/2020   TRIG 172.0 (H) 05/13/2021   CHOLHDL 4 05/13/2021   A/P: hopefully improved- update lipid panel today. Continue current meds for now  # GERD S:Medication:  pantoprazole 40 mg B12 levels related to PPI use: Lab Results  Component Value Date   [id number] 263 05/13/2021  A/P: reasonably well controlled- if wasn't having  hoarseness issues id consider trying 20 mg but hold off for now   #hoarseness since starting back coughing- discussed quitting again and if doesn't resolve likely refer to PT. Smokers cough worse since quitting back smoking  Recommended follow up: Return in about 6 months (around 04/09/2022) for followup or sooner if needed.Schedule b4 you leave.  Lab/Order associations: fasting   ICD-10-CM   1. Preventative health care  Z00.00 CBC with Differential/Platelet    Comprehensive metabolic panel    Lipid panel    Hemoglobin A1c    PSA    POCT Urinalysis Dipstick (Automated)    2. Chronic obstructive pulmonary disease, unspecified COPD type ([place])  J44.9     3. Diabetes mellitus without complication (HCC)  Z85.8 CBC with Differential/Platelet    Comprehensive metabolic panel    Lipid panel    Hemoglobin A1c    4. Hyperlipidemia associated with type 2 diabetes mellitus (HCC)  E11.69 CBC with Differential/Platelet   E78.5 Comprehensive metabolic panel    Lipid panel    5. Aortic atherosclerosis (HCC)  I70.0     6. Acquired hypothyroidism  E03.9 TSH    7. Smoker  F17.200 POCT Urinalysis Dipstick (Automated)    8. Screening for prostate cancer  Z12.5 PSA    9. High risk medication use  Z79.899 Vitamin B12      No orders of the defined types were placed in this encounter.   Return precautions advised.  Garret Reddish, MD

## 2021-10-14 ENCOUNTER — Other Ambulatory Visit (INDEPENDENT_AMBULATORY_CARE_PROVIDER_SITE_OTHER): Payer: Managed Care, Other (non HMO)

## 2021-10-14 DIAGNOSIS — E119 Type 2 diabetes mellitus without complications: Secondary | ICD-10-CM | POA: Diagnosis not present

## 2021-10-14 DIAGNOSIS — Z79899 Other long term (current) drug therapy: Secondary | ICD-10-CM | POA: Diagnosis not present

## 2021-10-14 DIAGNOSIS — F17200 Nicotine dependence, unspecified, uncomplicated: Secondary | ICD-10-CM | POA: Diagnosis not present

## 2021-10-14 DIAGNOSIS — E039 Hypothyroidism, unspecified: Secondary | ICD-10-CM

## 2021-10-14 DIAGNOSIS — Z0000 Encounter for general adult medical examination without abnormal findings: Secondary | ICD-10-CM | POA: Diagnosis not present

## 2021-10-14 DIAGNOSIS — E1169 Type 2 diabetes mellitus with other specified complication: Secondary | ICD-10-CM | POA: Diagnosis not present

## 2021-10-14 DIAGNOSIS — E785 Hyperlipidemia, unspecified: Secondary | ICD-10-CM

## 2021-10-14 DIAGNOSIS — Z125 Encounter for screening for malignant neoplasm of prostate: Secondary | ICD-10-CM

## 2021-10-14 LAB — CBC WITH DIFFERENTIAL/PLATELET
Basophils Absolute: 0 10*3/uL (ref 0.0–0.1)
Basophils Relative: 0.6 % (ref 0.0–3.0)
Eosinophils Absolute: 0.2 10*3/uL (ref 0.0–0.7)
Eosinophils Relative: 2.8 % (ref 0.0–5.0)
HCT: 44 % (ref 39.0–52.0)
Hemoglobin: 14.6 g/dL (ref 13.0–17.0)
Lymphocytes Relative: 19.1 % (ref 12.0–46.0)
Lymphs Abs: 1.3 10*3/uL (ref 0.7–4.0)
MCHC: 33.2 g/dL (ref 30.0–36.0)
MCV: 91.6 fl (ref 78.0–100.0)
Monocytes Absolute: 0.4 10*3/uL (ref 0.1–1.0)
Monocytes Relative: 6.2 % (ref 3.0–12.0)
Neutro Abs: 4.9 10*3/uL (ref 1.4–7.7)
Neutrophils Relative %: 71.3 % (ref 43.0–77.0)
Platelets: 261 10*3/uL (ref 150.0–400.0)
RBC: 4.8 Mil/uL (ref 4.22–5.81)
RDW: 14 % (ref 11.5–15.5)
WBC: 6.8 10*3/uL (ref 4.0–10.5)

## 2021-10-14 LAB — POC URINALSYSI DIPSTICK (AUTOMATED)
Bilirubin, UA: NEGATIVE
Blood, UA: NEGATIVE
Glucose, UA: NEGATIVE
Ketones, UA: NEGATIVE
Leukocytes, UA: NEGATIVE
Nitrite, UA: NEGATIVE
Protein, UA: NEGATIVE
Spec Grav, UA: >=1.03 — AB (ref 1.010–1.025)
Urobilinogen, UA: 1 E.U./dL
pH, UA: 5 (ref 5.0–8.0)

## 2021-10-14 LAB — LIPID PANEL
Cholesterol: 142 mg/dL (ref 0–200)
HDL: 38.9 mg/dL — ABNORMAL LOW (ref >39.00)
LDL Cholesterol: 65 mg/dL (ref 0–99)
NonHDL: 103.35
Total CHOL/HDL Ratio: 4
Triglycerides: 194 mg/dL — ABNORMAL HIGH (ref 0.0–149.0)
VLDL: 38.8 mg/dL (ref 0.0–40.0)

## 2021-10-14 LAB — COMPREHENSIVE METABOLIC PANEL
ALT: 26 U/L (ref 0–53)
AST: 15 U/L (ref 0–37)
Albumin: 4.5 g/dL (ref 3.5–5.2)
Alkaline Phosphatase: 47 U/L (ref 39–117)
BUN: 18 mg/dL (ref 6–23)
CO2: 28 mEq/L (ref 19–32)
Calcium: 10.1 mg/dL (ref 8.4–10.5)
Chloride: 103 mEq/L (ref 96–112)
Creatinine, Ser: 1.21 mg/dL (ref 0.40–1.50)
GFR: 62.87 mL/min (ref >60.00)
Glucose, Bld: 130 mg/dL — ABNORMAL HIGH (ref 70–99)
Potassium: 4.8 mEq/L (ref 3.5–5.1)
Sodium: 140 mEq/L (ref 135–145)
Total Bilirubin: 0.7 mg/dL (ref 0.2–1.2)
Total Protein: 6.7 g/dL (ref 6.0–8.3)

## 2021-10-14 LAB — VITAMIN B12: Vitamin B-12: 428 pg/mL (ref 211–911)

## 2021-10-14 LAB — PSA: PSA: 0.95 ng/mL (ref 0.10–4.00)

## 2021-10-14 LAB — TSH: TSH: 4.49 u[IU]/mL (ref 0.35–5.50)

## 2021-10-14 LAB — HEMOGLOBIN A1C: Hgb A1c MFr Bld: 8 % — ABNORMAL HIGH (ref 4.6–6.5)

## 2021-10-15 ENCOUNTER — Other Ambulatory Visit: Payer: Self-pay

## 2021-10-15 MED ORDER — EMPAGLIFLOZIN 10 MG PO TABS: 10.0000 mg | ORAL_TABLET | Freq: Every day | ORAL | 5 refills | Status: DC

## 2021-10-16 ENCOUNTER — Other Ambulatory Visit: Payer: Managed Care, Other (non HMO)

## 2021-12-03 ENCOUNTER — Encounter: Payer: Self-pay | Admitting: Family Medicine

## 2021-12-24 LAB — HM DIABETES EYE EXAM

## 2021-12-26 ENCOUNTER — Encounter: Payer: Self-pay | Admitting: Family Medicine

## 2022-01-27 ENCOUNTER — Encounter: Payer: Self-pay | Admitting: *Deleted

## 2022-02-03 ENCOUNTER — Ambulatory Visit: Payer: Managed Care, Other (non HMO) | Admitting: Family Medicine

## 2022-02-03 ENCOUNTER — Encounter: Payer: Self-pay | Admitting: Family Medicine

## 2022-02-03 VITALS — BP 118/64 | HR 76 | Temp 98.4°F | Ht 71.0 in | Wt 191.8 lb

## 2022-02-03 DIAGNOSIS — I700 Atherosclerosis of aorta: Secondary | ICD-10-CM | POA: Diagnosis not present

## 2022-02-03 DIAGNOSIS — E1169 Type 2 diabetes mellitus with other specified complication: Secondary | ICD-10-CM | POA: Diagnosis not present

## 2022-02-03 DIAGNOSIS — E785 Hyperlipidemia, unspecified: Secondary | ICD-10-CM | POA: Diagnosis not present

## 2022-02-03 DIAGNOSIS — E119 Type 2 diabetes mellitus without complications: Secondary | ICD-10-CM

## 2022-02-03 DIAGNOSIS — E039 Hypothyroidism, unspecified: Secondary | ICD-10-CM

## 2022-02-03 LAB — HEMOGLOBIN A1C: Hgb A1c MFr Bld: 6.6 % — ABNORMAL HIGH (ref 4.6–6.5)

## 2022-02-03 LAB — COMPREHENSIVE METABOLIC PANEL
ALT: 27 U/L (ref 0–53)
AST: 17 U/L (ref 0–37)
Albumin: 4.2 g/dL (ref 3.5–5.2)
Alkaline Phosphatase: 51 U/L (ref 39–117)
BUN: 19 mg/dL (ref 6–23)
CO2: 26 mEq/L (ref 19–32)
Calcium: 9.7 mg/dL (ref 8.4–10.5)
Chloride: 104 mEq/L (ref 96–112)
Creatinine, Ser: 1.11 mg/dL (ref 0.40–1.50)
GFR: 69.58 mL/min (ref >60.00)
Glucose, Bld: 88 mg/dL (ref 70–99)
Potassium: 4.2 mEq/L (ref 3.5–5.1)
Sodium: 139 mEq/L (ref 135–145)
Total Bilirubin: 0.5 mg/dL (ref 0.2–1.2)
Total Protein: 6.8 g/dL (ref 6.0–8.3)

## 2022-02-03 LAB — TSH: TSH: 2.59 u[IU]/mL (ref 0.35–5.50)

## 2022-02-03 NOTE — Patient Instructions (Addendum)
Please stop by lab before you go If you have mychart- we will send your results within 3 business days of [place] receiving them.  If you do not have mychart- we will call you about results within 5 business days of [place] receiving them.  *please also note that you will see labs on mychart as soon as they post. I will later go in and write notes on them- will say "notes from Dr. Yong Channel"   Recommended follow up: Return in about 4 months (around 06/06/2022) for followup or sooner if needed.Schedule b4 you leave. -could also schedule physical June 7th or later

## 2022-02-03 NOTE — Progress Notes (Signed)
Phone [phone] In person visit   Subjective:   Tracy Turner is a 66 y.o. year old very pleasant male patient who presents for/with See problem oriented charting Chief Complaint  Patient presents with   Follow-up   Diabetes   Past Medical History-  Patient Active Problem List   Diagnosis Date Noted   Smoker 05/06/2018    Priority: High   COPD (chronic obstructive pulmonary disease) ([place])     Priority: High   Diabetes mellitus without complication ([place]) 48/54/6270    Priority: High   Hypothyroidism 07/20/2009    Priority: Medium    Hyperlipidemia associated with type 2 diabetes mellitus ([place]) 07/20/2009    Priority: Medium    Testosterone deficiency 08/22/2013    Priority: Low   GERD 01/21/2010    Priority: Low   Aortic atherosclerosis ([place]) 07/31/2020    Medications- reviewed and updated Current Outpatient Medications  Medication Sig Dispense Refill   aspirin EC 81 MG tablet Take 81 mg by mouth daily.     atorvastatin (LIPITOR) 40 MG tablet Take 1 tablet (40 mg total) by mouth daily. 90 tablet 3   empagliflozin (JARDIANCE) 10 MG TABS tablet Take 1 tablet (10 mg total) by mouth daily before breakfast. 30 tablet 5   levothyroxine (SYNTHROID) 150 MCG tablet TAKE 1 TABLET DAILY 90 tablet 3   metFORMIN (GLUCOPHAGE) 1000 MG tablet TAKE 1 TABLET BY MOUTH  TWICE DAILY 180 tablet 3   Multiple Vitamins-Minerals (MENS 50+ MULTI VITAMIN/MIN PO) Take 1 tablet by mouth daily.     pantoprazole (PROTONIX) 40 MG tablet TAKE 1 TABLET DAILY 90 tablet 3   sildenafil (VIAGRA) 100 MG tablet Take 0.5 tablets (50 mg total) by mouth as needed for erectile dysfunction. 30 tablet 6   No current facility-administered medications for this visit.     Objective:  BP 118/64   Pulse 76   Temp 98.4 F (36.9 C)   Ht '5\' 11"'$  (1.803 m)   Wt 191 lb 12.8 oz (87 kg)   SpO2 95%   BMI 26.75 kg/m  Gen: NAD, resting comfortably CV: RRR no murmurs rubs or gallops Lungs: CTAB no crackles, wheeze,  rhonchi Ext: no edema Skin: warm, dry     Assessment and Plan   # social update- wife thankfully has done well with breast cancer- she wants to hold off on tamoxifen  # Diabetes S: Medication: metformin '1000mg'$  BID, jardiance 10 mg (does note mild dehydration) CBGs- most #s in 100s. As high as 122.  Exercise and diet- cut out in sugars except once a week. Has gotten more active- down 16 lbs as well! Cut out nestle quick milk -uses relion meter -tingling in legs better with more movement and possibly better sugar control Lab Results  Component Value Date   HGBA1C 8.0 (H) 10/14/2021   HGBA1C 7.4 (H) 05/13/2021   HGBA1C 7.1 (H) 05/25/2020   A/P: diabetes sounds to be signficiantly improved- update a1c with labs today. Hed like to reduce meds if possible- if a1c was as low as 6 maybe decrease to once daily metformin- if above this likely maintain meds  #hyperlipidemia with aortic atherosclerosis S: Medication: asa 81 mg, atorvastatin 40 mg  -body armour lite helps with cramping and hydration Lab Results  Component Value Date   CHOL 142 10/14/2021   HDL 38.90 (L) 10/14/2021   LDLCALC 65 10/14/2021   LDLDIRECT 98.0 05/25/2020   TRIG 194.0 (H) 10/14/2021   CHOLHDL 4 10/14/2021   A/P:  LDL  at ideal goal- continue current meds  #hypothyroidism S: compliant On thyroid medication- levothyroxine 150 mcg Lab Results  Component Value Date   TSH 4.49 10/14/2021   A/P: Controlled. Continue current medications.    #GERD- hoarseness started since starting back smoking prior to last visit but improved- we had discussed quitting again and refer to ENT if failed to improve. We continued on pantoprazole 40 mg as result (considered 20 mg otherwise- just refilled- consider another visit)  #tobacco use- recommended smoking cessation. Has mild copd- not on inhaler. He is not ready to quit- still wants to focus on diabetes control first  #HM- wants to hold off on prevnar and flu for  now  Recommended follow up: Return in about 4 months (around 06/06/2022) for followup or sooner if needed.Schedule b4 you leave. Future Appointments  Date Time Provider [place]  04/10/2022  9:00 AM Marin Olp, MD [facility]   Lab/Order associations:   ICD-10-CM   1. Acquired hypothyroidism  E03.9 TSH    2. Diabetes mellitus without complication (HCC)  W40.9 Comprehensive metabolic panel    Hemoglobin A1c    3. Hyperlipidemia associated with type 2 diabetes mellitus ([place])  E11.69    E78.5     4. Aortic atherosclerosis (HCC)  I70.0       No orders of the defined types were placed in this encounter.   Return precautions advised.  Garret Reddish, MD

## 2022-04-10 ENCOUNTER — Ambulatory Visit: Payer: Managed Care, Other (non HMO) | Admitting: Family Medicine

## 2022-04-22 ENCOUNTER — Other Ambulatory Visit: Payer: Self-pay | Admitting: Family Medicine

## 2022-05-28 ENCOUNTER — Other Ambulatory Visit: Payer: Self-pay | Admitting: Family Medicine

## 2022-05-30 ENCOUNTER — Ambulatory Visit: Payer: Managed Care, Other (non HMO) | Admitting: Family Medicine

## 2022-05-30 ENCOUNTER — Encounter: Payer: Self-pay | Admitting: Family Medicine

## 2022-05-30 VITALS — BP 100/62 | HR 79 | Temp 98.8°F | Ht 71.0 in | Wt 190.8 lb

## 2022-05-30 DIAGNOSIS — E785 Hyperlipidemia, unspecified: Secondary | ICD-10-CM

## 2022-05-30 DIAGNOSIS — E1169 Type 2 diabetes mellitus with other specified complication: Secondary | ICD-10-CM | POA: Diagnosis not present

## 2022-05-30 DIAGNOSIS — E039 Hypothyroidism, unspecified: Secondary | ICD-10-CM

## 2022-05-30 DIAGNOSIS — J449 Chronic obstructive pulmonary disease, unspecified: Secondary | ICD-10-CM | POA: Diagnosis not present

## 2022-05-30 DIAGNOSIS — E119 Type 2 diabetes mellitus without complications: Secondary | ICD-10-CM

## 2022-05-30 DIAGNOSIS — I700 Atherosclerosis of aorta: Secondary | ICD-10-CM

## 2022-05-30 NOTE — Progress Notes (Signed)
Phone [phone] In person visit   Subjective:   Tracy Turner is a 67 y.o. year old very pleasant male patient who presents for/with See problem oriented charting Chief Complaint  Patient presents with   Follow-up   Hypothyroidism   Diabetes    Pt states he has stopped Jardiance and his blood sugars have remained the same.    Past Medical History-  Patient Active Problem List   Diagnosis Date Noted   Smoker 05/06/2018    Priority: High   COPD (chronic obstructive pulmonary disease) ([place])     Priority: High   Diabetes mellitus without complication ([place]) 98/92/1194    Priority: High   Hypothyroidism 07/20/2009    Priority: Medium    Hyperlipidemia associated with type 2 diabetes mellitus ([place]) 07/20/2009    Priority: Medium    Testosterone deficiency 08/22/2013    Priority: Low   GERD 01/21/2010    Priority: Low   Aortic atherosclerosis ([place]) 07/31/2020    Medications- reviewed and updated Current Outpatient Medications  Medication Sig Dispense Refill   aspirin EC 81 MG tablet Take 81 mg by mouth daily.     atorvastatin (LIPITOR) 40 MG tablet TAKE 1 TABLET DAILY 90 tablet 3   levothyroxine (SYNTHROID) 150 MCG tablet TAKE 1 TABLET DAILY 90 tablet 3   metFORMIN (GLUCOPHAGE) 1000 MG tablet TAKE 1 TABLET TWICE A DAY 180 tablet 3   Multiple Vitamins-Minerals (MENS 50+ MULTI VITAMIN/MIN PO) Take 1 tablet by mouth daily.     pantoprazole (PROTONIX) 40 MG tablet TAKE 1 TABLET DAILY 90 tablet 3   sildenafil (VIAGRA) 100 MG tablet Take 0.5 tablets (50 mg total) by mouth as needed for erectile dysfunction. 30 tablet 6   No current facility-administered medications for this visit.     Objective:  BP 100/62   Pulse 79   Temp 98.8 F (37.1 C)   Ht '5\' 11"'$  (1.803 m)   Wt 190 lb 12.8 oz (86.5 kg)   SpO2 96%   BMI 26.61 kg/m  Gen: NAD, resting comfortably CV: RRR no murmurs rubs or gallops Lungs: CTAB no crackles, wheeze, rhonchi Ext: no edema Skin: warm, dry      Assessment and Plan   #social update- wife got laid off and he got her on [employer] (she's 59.5). he will be on [facility] (thinks jardiance will go from 75 to 131 per 3 months).   # Diabetes S: Medication:metformin '1000mg'$  BID, jardiance he stopped as worsened urination - frequency and felt dehydrated- stopped about a month ago CBGs- 100-120 in the morning. Did not go up after stopping jardiance Exercise and diet- not exercising right now but trying to eat well. Down 1 lb through holidays. Had gotten as low on home scale to 180.2- felt gained some weight with birthday recently Lab Results  Component Value Date   HGBA1C 6.6 (H) 02/03/2022   HGBA1C 8.0 (H) 10/14/2021   HGBA1C 7.4 (H) 05/13/2021  A/P: hopefully stable- update a1c today. Continue current meds for now (metformin)- we discussed with 1 month off jardiance if a1c 7 or above restart jardiance likely.  -some numbness in right foot when takes shoe off- was worse with high a1c  #hypothyroidism S: compliant On thyroid medication-levothyroxine  150 mcg Lab Results  Component Value Date   TSH 2.59 02/03/2022   A/P:stable- continue current medicines   #hyperlipidemia #aortic atherosclerosis S: Medication:atorvastatin 40 mg  Lab Results  Component Value Date   CHOL 142 10/14/2021   HDL 38.90 (L) 10/14/2021  LDLCALC 65 10/14/2021   LDLDIRECT 98.0 05/25/2020   TRIG 194.0 (H) 10/14/2021   CHOLHDL 4 10/14/2021   A/P: stable- continue current medicines. With improved diabetes I bet triglycerides as well Aortic atherosclerosis (presumed stable)- LDL goal ideally <70 - continue current medications   # GERD S:Medication: pantoprazole 40 mg . Very rare breakthrough other than one x bad experience with [employer] A/P: stable- continue current medicines    #Smoking/copd- down to 0.5 ppd- continues to try to cut down- encouraged full cessation. Has some leftover chantix may use -copd asymptomatic- encouraged smoking cessation but  otherwise no treatment  Recommended follow up: Return for next already scheduled visit or sooner if needed. Future Appointments  Date Time Provider [place]  10/10/2022  9:00 AM Marin Olp, MD [facility]    Lab/Order associations:   ICD-10-CM   1. Diabetes mellitus without complication (HCC)  R67.8 Microalbumin / creatinine urine ratio    Hemoglobin A1c    Comprehensive metabolic panel    2. Acquired hypothyroidism  E03.9     3. Chronic obstructive pulmonary disease, unspecified COPD type (HCC) Chronic J44.9     4. Hyperlipidemia associated with type 2 diabetes mellitus (HCC) Chronic E11.69    E78.5     5. Aortic atherosclerosis (HCC) Chronic I70.0      Return precautions advised.  Garret Reddish, MD

## 2022-05-30 NOTE — Patient Instructions (Addendum)
Please stop by lab before you go If you have mychart- we will send your results within 3 business days of [place] receiving them.  If you do not have mychart- we will call you about results within 5 business days of [place] receiving them.  *please also note that you will see labs on mychart as soon as they post. I will later go in and write notes on them- will say "notes from Dr. Yong Channel"   Recommended follow up: Return for next already scheduled visit or sooner if needed.

## 2022-05-31 LAB — HEMOGLOBIN A1C
Hgb A1c MFr Bld: 6.6 % of total Hgb — ABNORMAL HIGH (ref <5.7)
Mean Plasma Glucose: 143 mg/dL
eAG (mmol/L): 7.9 mmol/L

## 2022-05-31 LAB — COMPREHENSIVE METABOLIC PANEL
AG Ratio: 2 (calc) (ref 1.0–2.5)
ALT: 27 U/L (ref 9–46)
AST: 18 U/L (ref 10–35)
Albumin: 4.6 g/dL (ref 3.6–5.1)
Alkaline phosphatase (APISO): 54 U/L (ref 35–144)
BUN: 16 mg/dL (ref 7–25)
CO2: 25 mmol/L (ref 20–32)
Calcium: 9.9 mg/dL (ref 8.6–10.3)
Chloride: 106 mmol/L (ref 98–110)
Creat: 1.02 mg/dL (ref 0.70–1.35)
Globulin: 2.3 g/dL (calc) (ref 1.9–3.7)
Glucose, Bld: 85 mg/dL (ref 65–99)
Potassium: 4.4 mmol/L (ref 3.5–5.3)
Sodium: 147 mmol/L — ABNORMAL HIGH (ref 135–146)
Total Bilirubin: 0.3 mg/dL (ref 0.2–1.2)
Total Protein: 6.9 g/dL (ref 6.1–8.1)

## 2022-05-31 LAB — MICROALBUMIN / CREATININE URINE RATIO
Creatinine, Urine: 75 mg/dL (ref 20–320)
Microalb Creat Ratio: 5 mcg/mg creat (ref <30)
Microalb, Ur: 0.4 mg/dL

## 2022-06-05 DIAGNOSIS — L570 Actinic keratosis: Secondary | ICD-10-CM | POA: Diagnosis not present

## 2022-06-05 DIAGNOSIS — L814 Other melanin hyperpigmentation: Secondary | ICD-10-CM | POA: Diagnosis not present

## 2022-06-05 DIAGNOSIS — D225 Melanocytic nevi of trunk: Secondary | ICD-10-CM | POA: Diagnosis not present

## 2022-06-05 DIAGNOSIS — L821 Other seborrheic keratosis: Secondary | ICD-10-CM | POA: Diagnosis not present

## 2022-06-06 ENCOUNTER — Ambulatory Visit: Payer: Managed Care, Other (non HMO) | Admitting: Family Medicine

## 2022-06-24 ENCOUNTER — Telehealth: Payer: Self-pay | Admitting: Family Medicine

## 2022-06-24 NOTE — Telephone Encounter (Signed)
Pharmacy has been updated, will transfer meds when pt needs refills.

## 2022-06-24 NOTE — Telephone Encounter (Signed)
Pt now has new insurance. He needs to have all of his medication transferred to [facility]. He DOES NOT need refills at this time.

## 2022-07-11 ENCOUNTER — Ambulatory Visit
Admission: RE | Admit: 2022-07-11 | Discharge: 2022-07-11 | Disposition: A | Discharge status: Home / Self Care | Payer: Medicare Other | Source: Ambulatory Visit | Attending: Acute Care | Admitting: Acute Care

## 2022-07-11 DIAGNOSIS — Z87891 Personal history of nicotine dependence: Secondary | ICD-10-CM

## 2022-07-14 ENCOUNTER — Other Ambulatory Visit: Payer: Self-pay | Admitting: Acute Care

## 2022-07-14 DIAGNOSIS — Z87891 Personal history of nicotine dependence: Secondary | ICD-10-CM

## 2022-07-14 DIAGNOSIS — Z122 Encounter for screening for malignant neoplasm of respiratory organs: Secondary | ICD-10-CM

## 2022-07-21 ENCOUNTER — Other Ambulatory Visit: Payer: Self-pay | Admitting: Family Medicine

## 2022-07-28 ENCOUNTER — Telehealth: Payer: Self-pay | Admitting: Family Medicine

## 2022-07-28 DIAGNOSIS — M545 Low back pain, unspecified: Secondary | ICD-10-CM

## 2022-07-28 DIAGNOSIS — G8929 Other chronic pain: Secondary | ICD-10-CM

## 2022-07-28 MED ORDER — METFORMIN HCL 1000 MG PO TABS: ORAL_TABLET | ORAL | 3 refills | Status: DC

## 2022-07-28 MED ORDER — LEVOTHYROXINE SODIUM 150 MCG PO TABS: 150.0000 ug | ORAL_TABLET | Freq: Every day | ORAL | 3 refills | Status: DC

## 2022-07-28 MED ORDER — PANTOPRAZOLE SODIUM 40 MG PO TBEC: 40.0000 mg | DELAYED_RELEASE_TABLET | Freq: Every day | ORAL | 3 refills | Status: DC

## 2022-07-28 MED ORDER — ATORVASTATIN CALCIUM 40 MG PO TABS: 40.0000 mg | ORAL_TABLET | Freq: Every day | ORAL | 3 refills | Status: DC

## 2022-07-28 NOTE — Telephone Encounter (Signed)
Patient states would like to start physical therapy at [facility]. States he has visit on Wednesday, March 27th but needs a referral.   [place]  Phone: [phone] Fax: [phone]

## 2022-07-28 NOTE — Telephone Encounter (Signed)
Rx refilled.

## 2022-07-28 NOTE — Telephone Encounter (Signed)
Last OV: 05/30/22 Next OV: 10/10/22 Med: Levothyroxine 150 mcg  Pantoprazole 40 mg  Lipitor 40 mg  Metformin 1000mg  Pharmacy: [facility]

## 2022-07-28 NOTE — Telephone Encounter (Signed)
Referral has been placed per below request.

## 2022-07-30 DIAGNOSIS — M5459 Other low back pain: Secondary | ICD-10-CM | POA: Diagnosis not present

## 2022-07-30 DIAGNOSIS — M25511 Pain in right shoulder: Secondary | ICD-10-CM | POA: Diagnosis not present

## 2022-08-05 DIAGNOSIS — M5459 Other low back pain: Secondary | ICD-10-CM | POA: Diagnosis not present

## 2022-08-05 DIAGNOSIS — M25511 Pain in right shoulder: Secondary | ICD-10-CM | POA: Diagnosis not present

## 2022-08-08 DIAGNOSIS — M25511 Pain in right shoulder: Secondary | ICD-10-CM | POA: Diagnosis not present

## 2022-08-08 DIAGNOSIS — M5459 Other low back pain: Secondary | ICD-10-CM | POA: Diagnosis not present

## 2022-08-13 DIAGNOSIS — M25511 Pain in right shoulder: Secondary | ICD-10-CM | POA: Diagnosis not present

## 2022-08-13 DIAGNOSIS — M5459 Other low back pain: Secondary | ICD-10-CM | POA: Diagnosis not present

## 2022-08-15 DIAGNOSIS — M25511 Pain in right shoulder: Secondary | ICD-10-CM | POA: Diagnosis not present

## 2022-08-15 DIAGNOSIS — M5459 Other low back pain: Secondary | ICD-10-CM | POA: Diagnosis not present

## 2022-08-19 ENCOUNTER — Encounter: Payer: Self-pay | Admitting: Family Medicine

## 2022-08-19 ENCOUNTER — Ambulatory Visit (INDEPENDENT_AMBULATORY_CARE_PROVIDER_SITE_OTHER): Payer: Medicare Other | Admitting: Family Medicine

## 2022-08-19 VITALS — BP 128/74 | HR 82 | Temp 98.3°F | Ht 71.0 in | Wt 184.6 lb

## 2022-08-19 DIAGNOSIS — M5136 Other intervertebral disc degeneration, lumbar region: Secondary | ICD-10-CM

## 2022-08-19 DIAGNOSIS — M545 Low back pain, unspecified: Secondary | ICD-10-CM

## 2022-08-19 DIAGNOSIS — G8929 Other chronic pain: Secondary | ICD-10-CM | POA: Diagnosis not present

## 2022-08-19 NOTE — Progress Notes (Signed)
Phone [phone] In person visit   Subjective:   Tracy Turner is a 67 y.o. year old very pleasant male patient who presents for/with See problem oriented charting Chief Complaint  Patient presents with   Referral    Pt would like to discuss referral to pain due to chronic lower back pain. He is doing PT and stretching.    Past Medical History-  Patient Active Problem List   Diagnosis Date Noted   Smoker 05/06/2018    Priority: High   COPD (chronic obstructive pulmonary disease)     Priority: High   Diabetes mellitus without complication (HCC) 08/22/2013    Priority: High   Hypothyroidism 07/20/2009    Priority: Medium    Hyperlipidemia associated with type 2 diabetes mellitus 07/20/2009    Priority: Medium    Testosterone deficiency 08/22/2013    Priority: Low   GERD 01/21/2010    Priority: Low   Aortic atherosclerosis 07/31/2020    Medications- reviewed and updated Current Outpatient Medications  Medication Sig Dispense Refill   aspirin EC 81 MG tablet Take 81 mg by mouth daily.     atorvastatin (LIPITOR) 40 MG tablet Take 1 tablet (40 mg total) by mouth daily. 90 tablet 3   levothyroxine (SYNTHROID) 150 MCG tablet Take 1 tablet (150 mcg total) by mouth daily. 90 tablet 3   metFORMIN (GLUCOPHAGE) 1000 MG tablet TAKE 1 TABLET TWICE A DAY 180 tablet 3   Multiple Vitamins-Minerals (MENS 50+ MULTI VITAMIN/MIN PO) Take 1 tablet by mouth daily.     pantoprazole (PROTONIX) 40 MG tablet Take 1 tablet (40 mg total) by mouth daily. 90 tablet 3   sildenafil (VIAGRA) 100 MG tablet Take 0.5 tablets (50 mg total) by mouth as needed for erectile dysfunction. 30 tablet 6   No current facility-administered medications for this visit.     Objective:  BP 128/74   Pulse 82   Temp 98.3 F (36.8 C)   Ht  (1.803 m)   Wt 184 lb 9.6 oz (83.7 kg)   SpO2 96%   BMI 25.75 kg/m  Gen: NAD, resting comfortably Back - Normal skin, Spine with normal alignment and no deformity.  No  tenderness to vertebral process palpation.  Paraspinous muscles are not tender and without spasm.   Range of motion is full at neck and lumbar sacral regions. Negative Straight leg raise.  Neuro- no saddle anesthesia, 5/5 strength lower extremities -unable to reproduce pain     Assessment and Plan   # Right low back pain with history of moderate degenerative disk disease  S: Patient with back pain for several years.  Dates back to basketball when he had a hard fall. -In March 2022 we discussed recurrent pain with bending over-poker like sensation and in general made him very guarded about stretching or bending to try to protect himself.  Has had intense pain at times with coughing nearly to the point of tears ibuprofen helped some at that time. - With flare in March 2022 we tried tizanidine and we had x-rays of the lumbar spine due to chronicity.  We also checked a urinalysis and CMP which were largely reassuring - Also refer to physical therapy and discussed sports medicine referral - In the past had been on Valium and Darvocet but he did not think very clearly  -Lumbar spine films 07/30/2020 showed "1. No acute osseous abnormality. 2. Moderate multilevel lumbar degenerative disc disease. 3. Minimal lower lumbar retrolisthesis."  Most recently he reached out  in March and was referred to [facility] - he reports woke up about a month ago and was having trouble with shoulder- he improved with seeing them - he reports right lateral back pain. Still doing 45 minutes of stretching am and PM and finds that very helpful - he went out to play golf though on Saturday after weeks of feeling good and immediately with swinging felt recurrence of severe pain in right low back.  -leaving for [place] in 2 weeks -some midline pain from prior injury but had that at time of prior injury A/P: Right low back pain in patient with history of degenerative disk disease  from imaging in 2022- with ongoing pain  and only short term improvement with physical therapy but still unable to play golf which is very important to him- we opted to refer to sports medicine for their opinion - hed really like to find underlying cause and if there are treatments like steroid injections or nerve blocks that could make him more functional- short term he wants to be able to play golf for his time in [place]  -outside of golf is able to be very active- this is key issue for him- being able to play for his trip -offered NSAIDs like mobic and he wanted to wait and get sports medicine opinoin    Recommended follow up: Return for as needed for new, worsening, persistent symptoms. Future Appointments  Date Time Provider Department Center  10/10/2022  9:00 AM Shelva Majestic, MD [facility]   Lab/Order associations:   ICD-10-CM   1. Chronic right-sided low back pain without sciatica  M54.50 Ambulatory referral to Sports Medicine   G89.29     2. DDD (degenerative disc disease), lumbar  M51.36       Return precautions advised.  Tana Conch, MD

## 2022-08-19 NOTE — Patient Instructions (Addendum)
We have placed a referral for you today to [facility]- im asking for Dr. Jean Rosenthal or Dr. Katrinka Blazing. you will see # listed below- you can call this if you have not heard within 2 days.  Recommended follow up: Return for as needed for new, worsening, persistent symptoms.

## 2022-08-19 NOTE — Progress Notes (Unsigned)
    Aleen Sells D.[name] [address] [place] [address] Phone: [phone]   Assessment and Plan:     There are no diagnoses linked to this encounter.  ***   Pertinent previous records reviewed include ***   Follow Up: ***     Subjective:   I, Micaiah Remillard, am serving as a Neurosurgeon for Doctor Richardean Sale  Chief Complaint: low back pain   HPI:   08/20/2022 Patient is  67 year old male complaining of low back pain. Patient states  Relevant Historical Information: ***  Additional pertinent review of systems negative.   Current Outpatient Medications:    aspirin EC 81 MG tablet, Take 81 mg by mouth daily., Disp: , Rfl:    atorvastatin (LIPITOR) 40 MG tablet, Take 1 tablet (40 mg total) by mouth daily., Disp: 90 tablet, Rfl: 3   levothyroxine (SYNTHROID) 150 MCG tablet, Take 1 tablet (150 mcg total) by mouth daily., Disp: 90 tablet, Rfl: 3   metFORMIN (GLUCOPHAGE) 1000 MG tablet, TAKE 1 TABLET TWICE A DAY, Disp: 180 tablet, Rfl: 3   Multiple Vitamins-Minerals (MENS 50+ MULTI VITAMIN/MIN PO), Take 1 tablet by mouth daily., Disp: , Rfl:    pantoprazole (PROTONIX) 40 MG tablet, Take 1 tablet (40 mg total) by mouth daily., Disp: 90 tablet, Rfl: 3   sildenafil (VIAGRA) 100 MG tablet, Take 0.5 tablets (50 mg total) by mouth as needed for erectile dysfunction., Disp: 30 tablet, Rfl: 6   Objective:     There were no vitals filed for this visit.    There is no height or weight on file to calculate BMI.    Physical Exam:    ***   Electronically signed by:  Aleen Sells D.[name] 3:49 PM 08/19/22

## 2022-08-20 ENCOUNTER — Ambulatory Visit: Payer: Medicare Other | Admitting: Sports Medicine

## 2022-08-20 VITALS — BP 118/78 | HR 90 | Ht 71.0 in | Wt 183.0 lb

## 2022-08-20 DIAGNOSIS — G8929 Other chronic pain: Secondary | ICD-10-CM | POA: Diagnosis not present

## 2022-08-20 DIAGNOSIS — M5136 Other intervertebral disc degeneration, lumbar region: Secondary | ICD-10-CM

## 2022-08-20 DIAGNOSIS — M545 Low back pain, unspecified: Secondary | ICD-10-CM | POA: Diagnosis not present

## 2022-08-20 DIAGNOSIS — M25511 Pain in right shoulder: Secondary | ICD-10-CM | POA: Diagnosis not present

## 2022-08-20 DIAGNOSIS — M5459 Other low back pain: Secondary | ICD-10-CM | POA: Diagnosis not present

## 2022-08-20 MED ORDER — MELOXICAM 15 MG PO TABS: 15.0000 mg | ORAL_TABLET | Freq: Every day | ORAL | 0 refills | Status: DC

## 2022-08-20 NOTE — Patient Instructions (Addendum)
Good to see you  - Start meloxicam 15 mg daily x2 weeks.  If still having pain after 2 weeks, complete 3rd-week of meloxicam. May use remaining meloxicam as needed once daily for pain control.  Do not to use additional NSAIDs while taking meloxicam.  May use Tylenol [phone] mg 2 to 3 times a day for breakthrough pain. Low back HEP  MRI referral  Don't put things in your back pocket  Follow up 3 days after to discuss results

## 2022-08-21 DIAGNOSIS — M25511 Pain in right shoulder: Secondary | ICD-10-CM | POA: Diagnosis not present

## 2022-08-21 DIAGNOSIS — M5459 Other low back pain: Secondary | ICD-10-CM | POA: Diagnosis not present

## 2022-08-23 ENCOUNTER — Ambulatory Visit (INDEPENDENT_AMBULATORY_CARE_PROVIDER_SITE_OTHER): Payer: Medicare Other

## 2022-08-23 DIAGNOSIS — M545 Low back pain, unspecified: Secondary | ICD-10-CM | POA: Diagnosis not present

## 2022-08-23 DIAGNOSIS — G8929 Other chronic pain: Secondary | ICD-10-CM

## 2022-08-23 DIAGNOSIS — M47816 Spondylosis without myelopathy or radiculopathy, lumbar region: Secondary | ICD-10-CM | POA: Diagnosis not present

## 2022-08-23 DIAGNOSIS — M5126 Other intervertebral disc displacement, lumbar region: Secondary | ICD-10-CM | POA: Diagnosis not present

## 2022-08-26 ENCOUNTER — Encounter: Payer: Self-pay | Admitting: Sports Medicine

## 2022-08-26 NOTE — Progress Notes (Unsigned)
    Tracy Turner D.[name] [address] [place] [address] Phone: [phone]   Assessment and Plan:     There are no diagnoses linked to this encounter.  ***   Pertinent previous records reviewed include ***   Follow Up: ***     Subjective:   I, Tracy Turner, am serving as a Neurosurgeon for Doctor Tracy Turner   Chief Complaint: low back pain    HPI:    08/20/2022 Patient is  67 year old male complaining of low back pain. Patient states Patient with back pain for several years.  Dates back to basketball when he had a hard fall 45 years ago .   recurrent pain with bending over-poker like sensation  since he was [age] and in general made him very guarded about stretching or bending to try to protect himself.  Has had intense pain at times with coughing nearly to the point of tears ibuprofen helped some at that time. 3 weeks ago during his back swing in golf he felt a knife stabbing sensation , has a golf trip to [place] in a couple of weeks and wants to be able to play , icy hot and ibu and that helped some   08/27/2022 Patient states    Relevant Historical Information: COPD, smoker, DM type II, GERD  Additional pertinent review of systems negative.   Current Outpatient Medications:    aspirin EC 81 MG tablet, Take 81 mg by mouth daily., Disp: , Rfl:    atorvastatin (LIPITOR) 40 MG tablet, Take 1 tablet (40 mg total) by mouth daily., Disp: 90 tablet, Rfl: 3   levothyroxine (SYNTHROID) 150 MCG tablet, Take 1 tablet (150 mcg total) by mouth daily., Disp: 90 tablet, Rfl: 3   meloxicam (MOBIC) 15 MG tablet, Take 1 tablet (15 mg total) by mouth daily., Disp: 40 tablet, Rfl: 0   metFORMIN (GLUCOPHAGE) 1000 MG tablet, TAKE 1 TABLET TWICE A DAY, Disp: 180 tablet, Rfl: 3   Multiple Vitamins-Minerals (MENS 50+ MULTI VITAMIN/MIN PO), Take 1 tablet by mouth daily., Disp: , Rfl:    pantoprazole (PROTONIX) 40 MG tablet, Take 1 tablet (40 mg total)  by mouth daily., Disp: 90 tablet, Rfl: 3   sildenafil (VIAGRA) 100 MG tablet, Take 0.5 tablets (50 mg total) by mouth as needed for erectile dysfunction., Disp: 30 tablet, Rfl: 6   Objective:     There were no vitals filed for this visit.    There is no height or weight on file to calculate BMI.    Physical Exam:    ***   Electronically signed by:  Tracy Turner D.[name] 4:09 PM 08/26/22

## 2022-08-27 ENCOUNTER — Ambulatory Visit: Payer: Medicare Other | Admitting: Sports Medicine

## 2022-08-27 VITALS — BP 130/80 | HR 90 | Ht 71.0 in | Wt 185.0 lb

## 2022-08-27 DIAGNOSIS — G8929 Other chronic pain: Secondary | ICD-10-CM | POA: Diagnosis not present

## 2022-08-27 DIAGNOSIS — M25511 Pain in right shoulder: Secondary | ICD-10-CM | POA: Diagnosis not present

## 2022-08-27 DIAGNOSIS — M545 Low back pain, unspecified: Secondary | ICD-10-CM

## 2022-08-27 DIAGNOSIS — M5136 Other intervertebral disc degeneration, lumbar region: Secondary | ICD-10-CM | POA: Diagnosis not present

## 2022-08-27 DIAGNOSIS — M5459 Other low back pain: Secondary | ICD-10-CM | POA: Diagnosis not present

## 2022-08-27 MED ORDER — TIZANIDINE HCL 4 MG PO TABS: 4.0000 mg | ORAL_TABLET | Freq: Two times a day (BID) | ORAL | 0 refills | Status: DC | PRN

## 2022-08-27 NOTE — Patient Instructions (Addendum)
Good to see you  - Start meloxicam 15 mg daily x2 weeks.  If still having pain after 2 weeks, complete 3rd-week of meloxicam. May use remaining meloxicam as needed once daily for pain control.  Do not to use additional NSAIDs while taking meloxicam.  May use Tylenol [phone] mg 2 to 3 times a day for breakthrough pain. Tizanidine 4-8 mg 2x a day as needed  Core HEP  4-5 week follow up

## 2022-08-28 ENCOUNTER — Encounter: Payer: Self-pay | Admitting: Sports Medicine

## 2022-09-27 ENCOUNTER — Emergency Department (HOSPITAL_COMMUNITY)
Admission: EM | Admit: 2022-09-27 | Discharge: 2022-09-27 | Disposition: A | Discharge status: Home / Self Care | Payer: Medicare Other | Attending: Emergency Medicine | Admitting: Emergency Medicine

## 2022-09-27 ENCOUNTER — Other Ambulatory Visit: Payer: Self-pay

## 2022-09-27 ENCOUNTER — Encounter (HOSPITAL_COMMUNITY): Payer: Self-pay | Admitting: Pharmacy Technician

## 2022-09-27 ENCOUNTER — Emergency Department (HOSPITAL_COMMUNITY): Payer: Medicare Other

## 2022-09-27 DIAGNOSIS — F17210 Nicotine dependence, cigarettes, uncomplicated: Secondary | ICD-10-CM | POA: Diagnosis not present

## 2022-09-27 DIAGNOSIS — I471 Supraventricular tachycardia, unspecified: Secondary | ICD-10-CM | POA: Diagnosis not present

## 2022-09-27 DIAGNOSIS — Z0389 Encounter for observation for other suspected diseases and conditions ruled out: Secondary | ICD-10-CM | POA: Diagnosis not present

## 2022-09-27 DIAGNOSIS — R079 Chest pain, unspecified: Secondary | ICD-10-CM | POA: Diagnosis not present

## 2022-09-27 DIAGNOSIS — I499 Cardiac arrhythmia, unspecified: Secondary | ICD-10-CM | POA: Diagnosis not present

## 2022-09-27 DIAGNOSIS — R404 Transient alteration of awareness: Secondary | ICD-10-CM | POA: Diagnosis not present

## 2022-09-27 DIAGNOSIS — Z743 Need for continuous supervision: Secondary | ICD-10-CM | POA: Diagnosis not present

## 2022-09-27 DIAGNOSIS — R6889 Other general symptoms and signs: Secondary | ICD-10-CM | POA: Diagnosis not present

## 2022-09-27 LAB — CBC
HCT: 44.9 % (ref 39.0–52.0)
Hemoglobin: 14.6 g/dL (ref 13.0–17.0)
MCH: 30.9 pg (ref 26.0–34.0)
MCHC: 32.5 g/dL (ref 30.0–36.0)
MCV: 95.1 fL (ref 80.0–100.0)
Platelets: 256 10*3/uL (ref 150–400)
RBC: 4.72 MIL/uL (ref 4.22–5.81)
RDW: 12.8 % (ref 11.5–15.5)
WBC: 8.1 10*3/uL (ref 4.0–10.5)
nRBC: 0 % (ref 0.0–0.2)

## 2022-09-27 LAB — BASIC METABOLIC PANEL
Anion gap: 10 (ref 5–15)
BUN: 14 mg/dL (ref 8–23)
CO2: 23 mmol/L (ref 22–32)
Calcium: 9.2 mg/dL (ref 8.9–10.3)
Chloride: 103 mmol/L (ref 98–111)
Creatinine, Ser: 1.17 mg/dL (ref 0.61–1.24)
GFR, Estimated: >60 mL/min (ref >60)
Glucose, Bld: 100 mg/dL — ABNORMAL HIGH (ref 70–99)
Potassium: 3.6 mmol/L (ref 3.5–5.1)
Sodium: 136 mmol/L (ref 135–145)

## 2022-09-27 LAB — MAGNESIUM: Magnesium: 1.8 mg/dL (ref 1.7–2.4)

## 2022-09-27 MED ORDER — METOPROLOL TARTRATE 25 MG PO TABS: 25.0000 mg | ORAL_TABLET | Freq: Every day | ORAL | 1 refills | Status: DC | PRN

## 2022-09-27 NOTE — ED Provider Notes (Signed)
[place] EMERGENCY DEPARTMENT AT [facility] Provider Note   CSN: [id number] Arrival date & time: 09/27/22  1804     History  Chief Complaint  Patient presents with   SVT converted     SVT    Tracy Turner is a 67 y.o. male.  HPI    Patient is a very pleasant 67 year old male who smokes cigarettes, he is a borderline diabetic, he does not drink any significant amount of coffee or caffeine, he does not drink any significant amount of alcohol, he has no prior history of cardiac or pulmonary disease that he knows of.  He was in his usual state of health today when he got up to go to the kitchen, he felt acute onset of dizziness, a feeling of pressure, he felt very off, his heart rate was very elevated and the paramedics found the patient to be in SVT.  They delivered 6 mg of adenosine after trying vagal maneuvers unsuccessfully.  This caused a conversion to normal sinus rhythm prehospital.  The patient is now asymptomatic.  He denies any other feelings.  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  Home Medications Prior to Admission medications   Medication Sig Start Date End Date Taking? Authorizing Provider  metoprolol tartrate (LOPRESSOR) 25 MG tablet Take 1 tablet (25 mg total) by mouth daily as needed (rapid heart rate). 09/27/22 10/27/22 Yes Eber Hong, MD  aspirin EC 81 MG tablet Take 81 mg by mouth daily.    [provider]  atorvastatin (LIPITOR) 40 MG tablet Take 1 tablet (40 mg total) by mouth daily. 07/28/22   Shelva Majestic, MD  levothyroxine (SYNTHROID) 150 MCG tablet Take 1 tablet (150 mcg total) by mouth daily. 07/28/22   Shelva Majestic, MD  meloxicam (MOBIC) 15 MG tablet Take 1 tablet (15 mg total) by mouth daily. 08/20/22   Richardean Sale, DO  metFORMIN (GLUCOPHAGE) 1000 MG tablet TAKE 1 TABLET TWICE A DAY 07/28/22   Shelva Majestic, MD  Multiple Vitamins-Minerals (MENS 50+ MULTI  VITAMIN/MIN PO) Take 1 tablet by mouth daily.    [provider]  pantoprazole (PROTONIX) 40 MG tablet Take 1 tablet (40 mg total) by mouth daily. 07/28/22   Shelva Majestic, MD  sildenafil (VIAGRA) 100 MG tablet Take 0.5 tablets (50 mg total) by mouth as needed for erectile dysfunction. 03/27/20   Shelva Majestic, MD  tiZANidine (ZANAFLEX) 4 MG tablet Take 1 tablet (4 mg total) by mouth 2 (two) times daily as needed for muscle spasms. 08/27/22   Richardean Sale, DO      Allergies    Patient has no known allergies.    Review of Systems   Review of Systems  All other systems reviewed and are negative.   Physical Exam Updated Vital Signs BP 121/72 (BP Location: Right Arm)   Pulse (!) 108   Temp 98.4 F (36.9 C) (Oral)   Resp 16   SpO2 98%  Physical Exam Vitals and nursing note reviewed.  Constitutional:      General: He is not in acute distress.    Appearance: He is well-developed.  HENT:     Head: Normocephalic and atraumatic.     Mouth/Throat:     Pharynx: No oropharyngeal exudate.  Eyes:     General: No scleral icterus.       Right eye: No discharge.        Left eye: No  discharge.     Conjunctiva/sclera: Conjunctivae normal.     Pupils: Pupils are equal, round, and reactive to light.  Neck:     Thyroid: No thyromegaly.     Vascular: No JVD.  Cardiovascular:     Rate and Rhythm: Normal rate and regular rhythm.     Heart sounds: Normal heart sounds. No murmur heard.    No friction rub. No gallop.  Pulmonary:     Effort: Pulmonary effort is normal. No respiratory distress.     Breath sounds: Normal breath sounds. No wheezing or rales.  Abdominal:     General: Bowel sounds are normal. There is no distension.     Palpations: Abdomen is soft. There is no mass.     Tenderness: There is no abdominal tenderness.  Musculoskeletal:        General: No tenderness. Normal range of motion.     Cervical back: Normal range of motion and neck supple.     Right lower  leg: No edema.     Left lower leg: No edema.  Lymphadenopathy:     Cervical: No cervical adenopathy.  Skin:    General: Skin is warm and dry.     Findings: No erythema or rash.  Neurological:     General: No focal deficit present.     Mental Status: He is alert.     Coordination: Coordination normal.  Psychiatric:        Behavior: Behavior normal.     ED Results / Procedures / Treatments   Labs (all labs ordered are listed, but only abnormal results are displayed) Labs Reviewed  BASIC METABOLIC PANEL - Abnormal; Notable for the following components:      Result Value   Glucose, Bld 100 (*)    All other components within normal limits  CBC  MAGNESIUM    EKG EKG Interpretation  Date/Time:  Saturday Sep 27 2022 18:10:44 EDT Ventricular Rate:  113 PR Interval:  172 QRS Duration: 92 QT Interval:  332 QTC Calculation: 455 R Axis:   82 Text Interpretation: Sinus tachycardia Otherwise normal ECG When compared with ECG of 22-May-2014 10:38, PREVIOUS ECG IS PRESENT Rate faster Confirmed by Glynn Octave [phone]) on 09/27/2022 6:23:23 PM  Radiology DG Chest 2 View  Result Date: 09/27/2022 CLINICAL DATA:  SVT EXAM: CHEST - 2 VIEW COMPARISON:  CT of the chest July 11, 2022 FINDINGS: The heart size and mediastinal contours are within normal limits. Both lungs are clear. The visualized skeletal structures are unremarkable. IMPRESSION: No active cardiopulmonary disease. Electronically Signed   By: Ted Mcalpine M.D.   On: 09/27/2022 19:23    Procedures Procedures    Medications Ordered in ED Medications - No data to display  ED Course/ Medical Decision Making/ A&P                             Medical Decision Making Amount and/or Complexity of Data Reviewed Labs: ordered. Radiology: ordered.  Risk Prescription drug management.    This patient presents to the ED for concern of palpitations differential diagnosis includes SVT, A-fib, a flutter, V. tach,  thankfully the prehospital tracings confirm SVT which I was able to evaluate.  The recorded conversion to normal sinus rhythm was captured as well.    Additional history obtained:  Additional history obtained from electronic medical record External records from outside source obtained and reviewed including no significant prior visits to the hospital, he is  followed for his back pain in the office by sports medicine   Lab Tests:  I Ordered, and personally interpreted labs.  The pertinent results include: CBC and metabolic panel unremarkable   Imaging Studies ordered:  I ordered imaging studies including chest x-ray I independently visualized and interpreted imaging which showed no findings I agree with the radiologist interpretation   Medicines ordered and prescription drug management:  I ordered medication including metoprolol tartrate for interim tachycardia the patient can use this medication at home  I have reviewed the patients home medicines and have made adjustments as needed   Problem List / ED Course:  SVT resolved, kept on a cardiac monitor for the last hour without any recurrent arrhythmias, he is totally asymptomatic with unremarkable labs, vitals are totally normal, this patient is well-appearing with a heart rate of 80 at the time of discharge, he understands indications for return.   Social Determinants of Health:  Tobacco use  Patient was counseled on his tobacco use           Final Clinical Impression(s) / ED Diagnoses Final diagnoses:  SVT (supraventricular tachycardia)    Rx / DC Orders ED Discharge Orders          Ordered    metoprolol tartrate (LOPRESSOR) 25 MG tablet  Daily PRN        09/27/22 2208              Eber Hong, MD 09/27/22 2214

## 2022-09-27 NOTE — ED Triage Notes (Signed)
Pt bib ems from home with reports of sudden onset bloating pressure and dizziness. Pt SVT 180. En route given 6mg  adenosine with conversion to NSR.

## 2022-09-27 NOTE — Discharge Instructions (Addendum)
Your testing was very reassuring, please stop smoking immediately, if you need help contact your doctor for additional resources. \ If you continue to have these episodes you will need to be seen by cardiologist however this may never happen again.  If it does please practice the exercises that we practiced including holding her breath and bearing down.  If this does not work you may take one of the pills called metoprolol, if that does not work come back to the hospital immediately.  If your symptoms are severe return to the emergency department immediately.

## 2022-10-01 ENCOUNTER — Telehealth: Payer: Self-pay | Admitting: *Deleted

## 2022-10-01 NOTE — Telephone Encounter (Signed)
Transition Care Management Unsuccessful Follow-up Telephone Call  Date of discharge and from where:  [place] [facility] 09/28/2022  Attempts:  1st Attempt  Reason for unsuccessful TCM follow-up call:  Left voice message

## 2022-10-02 ENCOUNTER — Telehealth: Payer: Self-pay | Admitting: *Deleted

## 2022-10-02 NOTE — Telephone Encounter (Signed)
Transition Care Management Unsuccessful Follow-up Telephone Call  Date of discharge and from where:  [place] [facility] 09/27/2022  Attempts:  2nd Attempt  Reason for unsuccessful TCM follow-up call:  Left voice message

## 2022-10-08 ENCOUNTER — Encounter: Payer: Self-pay | Admitting: Family Medicine

## 2022-10-10 ENCOUNTER — Encounter: Payer: Self-pay | Admitting: Family Medicine

## 2022-10-10 ENCOUNTER — Ambulatory Visit (INDEPENDENT_AMBULATORY_CARE_PROVIDER_SITE_OTHER): Payer: Medicare Other | Admitting: Family Medicine

## 2022-10-10 VITALS — BP 120/72 | HR 72 | Temp 98.5°F | Ht 71.0 in | Wt 185.4 lb

## 2022-10-10 DIAGNOSIS — E785 Hyperlipidemia, unspecified: Secondary | ICD-10-CM | POA: Diagnosis not present

## 2022-10-10 DIAGNOSIS — E119 Type 2 diabetes mellitus without complications: Secondary | ICD-10-CM

## 2022-10-10 DIAGNOSIS — E039 Hypothyroidism, unspecified: Secondary | ICD-10-CM

## 2022-10-10 DIAGNOSIS — I700 Atherosclerosis of aorta: Secondary | ICD-10-CM

## 2022-10-10 DIAGNOSIS — Z125 Encounter for screening for malignant neoplasm of prostate: Secondary | ICD-10-CM

## 2022-10-10 DIAGNOSIS — F17200 Nicotine dependence, unspecified, uncomplicated: Secondary | ICD-10-CM | POA: Diagnosis not present

## 2022-10-10 DIAGNOSIS — Z79899 Other long term (current) drug therapy: Secondary | ICD-10-CM

## 2022-10-10 DIAGNOSIS — Z0000 Encounter for general adult medical examination without abnormal findings: Secondary | ICD-10-CM | POA: Diagnosis not present

## 2022-10-10 DIAGNOSIS — Z7984 Long term (current) use of oral hypoglycemic drugs: Secondary | ICD-10-CM | POA: Diagnosis not present

## 2022-10-10 DIAGNOSIS — E1169 Type 2 diabetes mellitus with other specified complication: Secondary | ICD-10-CM | POA: Diagnosis not present

## 2022-10-10 LAB — COMPREHENSIVE METABOLIC PANEL
ALT: 24 U/L (ref 0–53)
AST: 15 U/L (ref 0–37)
Albumin: 4.3 g/dL (ref 3.5–5.2)
Alkaline Phosphatase: 59 U/L (ref 39–117)
BUN: 14 mg/dL (ref 6–23)
CO2: 29 mEq/L (ref 19–32)
Calcium: 9.5 mg/dL (ref 8.4–10.5)
Chloride: 104 mEq/L (ref 96–112)
Creatinine, Ser: 0.98 mg/dL (ref 0.40–1.50)
GFR: 80.41 mL/min (ref >60.00)
Glucose, Bld: 103 mg/dL — ABNORMAL HIGH (ref 70–99)
Potassium: 4.3 mEq/L (ref 3.5–5.1)
Sodium: 141 mEq/L (ref 135–145)
Total Bilirubin: 0.6 mg/dL (ref 0.2–1.2)
Total Protein: 6.7 g/dL (ref 6.0–8.3)

## 2022-10-10 LAB — LIPID PANEL
Cholesterol: 107 mg/dL (ref 0–200)
HDL: 37.5 mg/dL — ABNORMAL LOW (ref >39.00)
LDL Cholesterol: 48 mg/dL (ref 0–99)
NonHDL: 69.89
Total CHOL/HDL Ratio: 3
Triglycerides: 109 mg/dL (ref 0.0–149.0)
VLDL: 21.8 mg/dL (ref 0.0–40.0)

## 2022-10-10 LAB — URINALYSIS, ROUTINE W REFLEX MICROSCOPIC
Bilirubin Urine: NEGATIVE
Hgb urine dipstick: NEGATIVE
Ketones, ur: NEGATIVE
Leukocytes,Ua: NEGATIVE
Nitrite: NEGATIVE
RBC / HPF: NONE SEEN (ref 0–?)
Specific Gravity, Urine: >=1.03 — AB (ref 1.000–1.030)
Total Protein, Urine: NEGATIVE
Urine Glucose: NEGATIVE
Urobilinogen, UA: 0.2 (ref 0.0–1.0)
pH: 6 (ref 5.0–8.0)

## 2022-10-10 LAB — CBC WITH DIFFERENTIAL/PLATELET
Basophils Absolute: 0.1 10*3/uL (ref 0.0–0.1)
Basophils Relative: 0.7 % (ref 0.0–3.0)
Eosinophils Absolute: 0.2 10*3/uL (ref 0.0–0.7)
Eosinophils Relative: 3 % (ref 0.0–5.0)
HCT: 44.7 % (ref 39.0–52.0)
Hemoglobin: 14.5 g/dL (ref 13.0–17.0)
Lymphocytes Relative: 15.9 % (ref 12.0–46.0)
Lymphs Abs: 1.2 10*3/uL (ref 0.7–4.0)
MCHC: 32.5 g/dL (ref 30.0–36.0)
MCV: 95.4 fl (ref 78.0–100.0)
Monocytes Absolute: 0.6 10*3/uL (ref 0.1–1.0)
Monocytes Relative: 8.1 % (ref 3.0–12.0)
Neutro Abs: 5.6 10*3/uL (ref 1.4–7.7)
Neutrophils Relative %: 72.3 % (ref 43.0–77.0)
Platelets: 247 10*3/uL (ref 150.0–400.0)
RBC: 4.68 Mil/uL (ref 4.22–5.81)
RDW: 13.1 % (ref 11.5–15.5)
WBC: 7.8 10*3/uL (ref 4.0–10.5)

## 2022-10-10 LAB — VITAMIN B12: Vitamin B-12: 537 pg/mL (ref 211–911)

## 2022-10-10 LAB — TSH: TSH: 1.79 u[IU]/mL (ref 0.35–5.50)

## 2022-10-10 LAB — PSA, MEDICARE: PSA: 1.46 ng/ml (ref 0.10–4.00)

## 2022-10-10 MED ORDER — PANTOPRAZOLE SODIUM 20 MG PO TBEC: 40.0000 mg | DELAYED_RELEASE_TABLET | Freq: Every day | ORAL | 3 refills | Status: DC

## 2022-10-10 NOTE — Progress Notes (Signed)
Phone: [phone]   Subjective:  Patient presents today for their annual physical. Chief complaint-noted.   See problem oriented charting- ROS- full  review of systems was completed and negative  except for: recent palpitations with SVT episode, some back pain , mild neck soreness- strained in ambulance and some into right shoulder- got new pillwo to try to help, harder to get stream going and then some urgency  The following were reviewed and entered/updated in epic: Past Medical History:  Diagnosis Date   COPD (chronic obstructive pulmonary disease) (HCC)    mild   Diabetes mellitus without complication (HCC)    more controlled A1C after 30 # weight lost   GERD (gastroesophageal reflux disease)    History of colonic polyps    Hyperlipidemia    Hypothyroidism    NONGONOCOCCAL URETHRITIS 10/12/2009   Qualifier: Diagnosis of  By: Amador Cunas  MD, Janett Labella    Tobacco abuse    Patient Active Problem List   Diagnosis Date Noted   Smoker 05/06/2018    Priority: High   COPD (chronic obstructive pulmonary disease) (HCC)     Priority: High   Diabetes mellitus without complication (HCC) 08/22/2013    Priority: High   Hypothyroidism 07/20/2009    Priority: Medium    Hyperlipidemia associated with type 2 diabetes mellitus (HCC) 07/20/2009    Priority: Medium    Testosterone deficiency 08/22/2013    Priority: Low   GERD 01/21/2010    Priority: Low   Aortic atherosclerosis (HCC) 07/31/2020   Past Surgical History:  Procedure Laterality Date   COLONOSCOPY  2008   ETT  2007   INGUINAL HERNIA REPAIR Bilateral 05/25/2014   Procedure: BILATERAL LAPAROSCOPIC INGUINAL HERNIA WITH MESH;  Surgeon: Karie Soda, MD;  Location: [facility];  Service: General;  Laterality: Bilateral;   INSERTION OF MESH Bilateral 05/25/2014   Procedure: INSERTION OF MESH;  Surgeon: Karie Soda, MD;  Location: [facility];  Service: General;  Laterality: Bilateral;   TOOTH EXTRACTION     several -molars    Family  History  Problem Relation Age of Onset   Heart disease Mother    Dementia Mother        on hospice early 76-died from COVID-19 in rehab-got sick from nurse   Heart disease Father        died at 4   Stroke Father    Hyperlipidemia Father    Obesity Sister        unknown cause of death- possible cardiac   Drug abuse Sister    Hypertension Sister    Hypertension Brother    Colon cancer Neg Hx     Medications- reviewed and updated Current Outpatient Medications  Medication Sig Dispense Refill   aspirin EC 81 MG tablet Take 81 mg by mouth daily.     atorvastatin (LIPITOR) 40 MG tablet Take 1 tablet (40 mg total) by mouth daily. 90 tablet 3   levothyroxine (SYNTHROID) 150 MCG tablet Take 1 tablet (150 mcg total) by mouth daily. 90 tablet 3   meloxicam (MOBIC) 15 MG tablet Take 1 tablet (15 mg total) by mouth daily. 40 tablet 0   metFORMIN (GLUCOPHAGE) 1000 MG tablet TAKE 1 TABLET TWICE A DAY 180 tablet 3   Multiple Vitamins-Minerals (MENS 50+ MULTI VITAMIN/MIN PO) Take 1 tablet by mouth daily.     metoprolol tartrate (LOPRESSOR) 25 MG tablet Take 1 tablet (25 mg total) by mouth daily as needed (rapid heart rate). (Patient not taking: Reported on 10/10/2022)  30 tablet 1   pantoprazole (PROTONIX) 20 MG tablet Take 2 tablets (40 mg total) by mouth daily. 90 tablet 3   sildenafil (VIAGRA) 100 MG tablet Take 0.5 tablets (50 mg total) by mouth as needed for erectile dysfunction. (Patient not taking: Reported on 10/10/2022) 30 tablet 6   No current facility-administered medications for this visit.    Allergies-reviewed and updated No Known Allergies  Social History   Social History Narrative   Married. 3 children. 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    Prior landscaping as a side business      Hobbies: golf   Objective  Objective:  BP 120/72   Pulse 72   Temp 98.5 F (36.9 C)   Ht 5\' 11"  (1.803 m)   Wt 185 lb 6.4 oz  (84.1 kg)   SpO2 94%   BMI 25.86 kg/m  Gen: NAD, resting comfortably HEENT: Mucous membranes are moist. Oropharynx normal Neck: no thyromegaly CV: RRR no murmurs rubs or gallops Lungs: CTAB no crackles, wheeze, rhonchi Abdomen: soft/nontender/nondistended/normal bowel sounds. No rebound or guarding.  Ext: no edema Skin: warm, dry Neuro: grossly normal, moves all extremities, PERRLA  Diabetic Foot Exam - Simple   Simple Foot Form Diabetic Foot exam was performed with the following findings: Yes 10/10/2022  9:44 AM  Visual Inspection No deformities, no ulcerations, no other skin breakdown bilaterally: Yes Sensation Testing Intact to touch and monofilament testing bilaterally: Yes Pulse Check Posterior Tibialis and Dorsalis pulse intact bilaterally: Yes Comments      Assessment and Plan  67 y.o. male presenting for annual physical.  Health Maintenance counseling: 1. Anticipatory guidance: Patient counseled regarding regular dental exams - advised q6 months, eye exams -yearly in august,  avoiding smoking and second hand smoke- see above , limiting alcohol to 2 beverages per day - very rare intake, no illicit drugs .   2. Risk factor reduction:  Advised patient of need for regular exercise and diet rich and fruits and vegetables to reduce risk of heart attack and stroke.  Exercise- hard with back issues recently- when back stabilizes wants to get back into the gym.  Diet/weight management-down 22 pounds from last year but had gained several lbs before last physical- reports has regained 5 lbs in last few weeks- had to be intentional as appetite is just lower. Tries to eat reasonably healthy to watch sugar intake which headache(s) slikely helped weight loss Wt Readings from Last 3 Encounters:  10/10/22 185 lb 6.4 oz (84.1 kg)  08/27/22 185 lb (83.9 kg)  08/20/22 183 lb (83 kg)  3. Immunizations/screenings/ancillary - opts out shingrix and COVID. Offered prevnar 20- wants to consider  future year Immunization History  Administered Date(s) Administered   Influenza Inj Mdck Quad Pf 03/16/2018   Influenza Split 03/06/2011   Influenza, Quadrivalent, Recombinant, Inj, Pf 02/27/2019   Influenza,inj,Quad PF,6+ Mos 04/04/2013, 04/01/2017   Influenza-Unspecified 02/02/2014, 02/27/2015, 02/07/2016, 02/16/2018   Pneumococcal Polysaccharide-23 05/06/2018   Tdap 07/29/2013  4. Prostate cancer screening-  low risk prior psa trend- continue to monitor  Lab Results  Component Value Date   PSA 0.95 10/14/2021   PSA 0.74 05/25/2020   PSA 1.12 05/06/2018   5. Colon cancer screening - 04/08/2017 with 10 year repeat 6. Skin cancer screening- occasional dermatology visits- seen 3 months ago by Dr. Charm Barges. advised regular sunscreen use. Denies worrisome, changing, or new skin lesions.  7. Smoking associated screening (lung cancer screening, AAA screen 65-75, UA)- current smoker (had quit in past but restarted after losing sister)- get urinalysis, lung cancer screen -26 pack years - he is enrolled, abdominal aortic aneurysm  8. STD screening - only active with wife  Status of chronic or acute concerns   #social udpate- wife still doing well after prior breast cancer  # Diabetes S: Medication: metformin 1g twice daily - appetite has been lower and with weight loss - now off jardiance CBGs-126 this morning- some variability  Lab Results  Component Value Date   HGBA1C 6.6 (H) 05/30/2022   HGBA1C 6.6 (H) 02/03/2022   HGBA1C 8.0 (H) 10/14/2021  A/P: hopefully stable- update a1c today. Continue current meds for now  -he is interested in trying 500 mg twice daily if a1c is lets say 6.3 or less  #hyperlipidemia #aortic atherosclerosis, 3 vessel coronary atherosclerosis per lung cancer screening 07/13/22 S: Medication: atorvastatin 40 mg daily Lab Results  Component Value Date   CHOL 142 10/14/2021   HDL 38.90 (L) 10/14/2021   LDLCALC 65 10/14/2021   LDLDIRECT 98.0 05/25/2020   TRIG  194.0 (H) 10/14/2021   CHOLHDL 4 10/14/2021  A/P: lipids- hopefully stable- update lipid panel today. Continue current meds for now  Aortic atherosclerosis (presumed stable)- LDL goal ideally <70 - has been at goal   #Emphysema-incidental finding on lung cancer screening 07/13/22- stressed importance of quitting smoking- he already knows this and is working on it- may use remaining chantix   # SVT - presented to Emergency Department 09/27/22 S:medication: metoprolol 25 mg as needed for rapid heart rate  - has not had to use yet -noted he didn't feel well in the kitchen just trying to eat a popsicles after active day- EMS noted 180 heart rate - gave him adenosine A/P: offered cardiology referral- he prefers to monitor for recurrence- if has a lot of episodes may consider at that time  #hypothyroidism S: compliant On thyroid medication- levothyroxine 150 mcg  A/P:hopefully stable- update tsh today. Continue current meds for now    # GERD S:Medication:  pantoprazole 40 mg A/P: reasonable control even with famotidine at times- trial 20 mg -check B12 with long term proton pump inhibitor (PPI) stomach acid reducer use  #erectile dysfunction (ED) - sildenafil helpful  but rarely uses   #MUSCULOSKELETAL- has seen Dr. Jean Rosenthal - history of right low back pain with known degenerative disk disease - has required recent MRI but was unable to play golf for [place] trips -tried home exercises -did well with meloxicam - seemed to help low back -also used muscle relaxant - tizanidine did not help so stopped -still anxious to swing a golf club as that has been big trigger- may try again in next few weeks  Recommended follow up: Return in about 6 months (around 04/11/2023) for followup or sooner if needed.Schedule b4 you leave.  Lab/Order associations: fasting   ICD-10-CM   1. Routine general medical examination at a health care facility  Z00.00     2. Diabetes mellitus without complication (HCC)  E11.9      3. Smoker  F17.200 Urinalysis, Routine w reflex microscopic    US [employer] MEDICARE SCREENING    4. Hyperlipidemia associated with type 2 diabetes mellitus (HCC)  E11.69 CBC with Differential/Platelet   E78.5 Lipid panel    Comprehensive metabolic panel    5. Acquired hypothyroidism  E03.9 TSH    6. Aortic atherosclerosis (HCC)  I70.0  7. Screening for prostate cancer  Z12.5 PSA, Medicare    8. High risk medication use  Z79.899 Vitamin B12      Meds ordered this encounter  Medications   pantoprazole (PROTONIX) 20 MG tablet    Sig: Take 2 tablets (40 mg total) by mouth daily.    Dispense:  90 tablet    Refill:  3    Return precautions advised.  Tana Conch, MD

## 2022-10-10 NOTE — Patient Instructions (Addendum)
Schedule dentist follow up   We will call you within two weeks about your referral to aneurysm screening with ultrasound through [facility].  Their phone number is [phone].  Please call them if you have not heard in 1-2 weeks  Trial lower dose pantoprazole 20 mg  Please stop by lab before you go If you have mychart- we will send your results within 3 business days of [place] receiving them.  If you do not have mychart- we will call you about results within 5 business days of [place] receiving them.  *please also note that you will see labs on mychart as soon as they post. I will later go in and write notes on them- will say "notes from Dr. Durene Cal"   Recommended follow up: Return in about 6 months (around 04/11/2023) for followup or sooner if needed.Schedule b4 you leave.

## 2022-10-13 ENCOUNTER — Ambulatory Visit
Admission: RE | Admit: 2022-10-13 | Discharge: 2022-10-13 | Disposition: A | Discharge status: Home / Self Care | Payer: Medicare Other | Source: Ambulatory Visit | Attending: Family Medicine | Admitting: Family Medicine

## 2022-10-13 DIAGNOSIS — F17210 Nicotine dependence, cigarettes, uncomplicated: Secondary | ICD-10-CM | POA: Diagnosis not present

## 2022-10-13 DIAGNOSIS — Z136 Encounter for screening for cardiovascular disorders: Secondary | ICD-10-CM | POA: Diagnosis not present

## 2022-10-13 DIAGNOSIS — F17200 Nicotine dependence, unspecified, uncomplicated: Secondary | ICD-10-CM

## 2022-10-14 ENCOUNTER — Encounter: Payer: Self-pay | Admitting: Family Medicine

## 2022-12-18 ENCOUNTER — Other Ambulatory Visit: Payer: Self-pay | Admitting: Family Medicine

## 2023-01-09 IMAGING — CT CT CHEST LUNG CANCER SCREENING LOW DOSE W/O CM
1 of 2 series · 10 of 20 positions shown, 13 images · non-contrast
Comparison: No priors.

CLINICAL DATA: 65-year-old male current smoker with 30 pack-year
history of smoking. Lung cancer screening examination.

EXAM:
CT CHEST WITHOUT CONTRAST LOW-DOSE FOR LUNG CANCER SCREENING
TECHNIQUE: Multidetector CT imaging of the chest was performed following the
standard protocol without IV contrast.

[ct lung segmentation data · axial · 0.78mm/px · z∈[+907,+907]mm · 10 of 310 frames shown]
[frame 1/310  mediastinal]
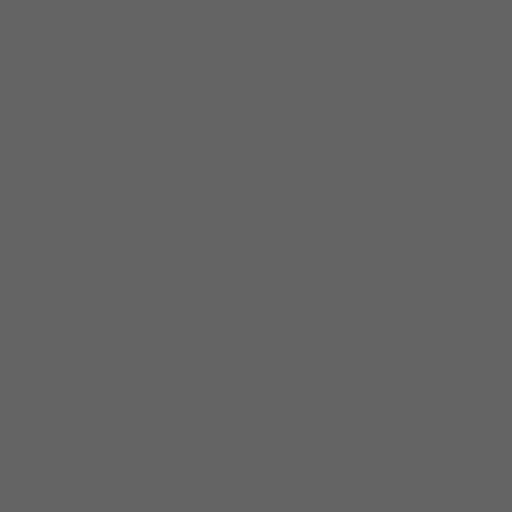
[frame 1/310  lung]
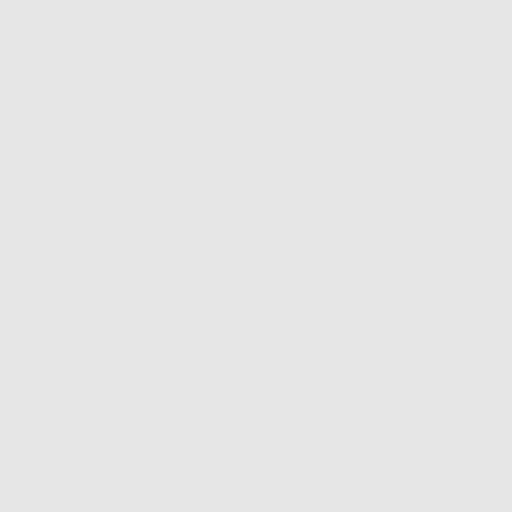
[frame 35/310  lung]
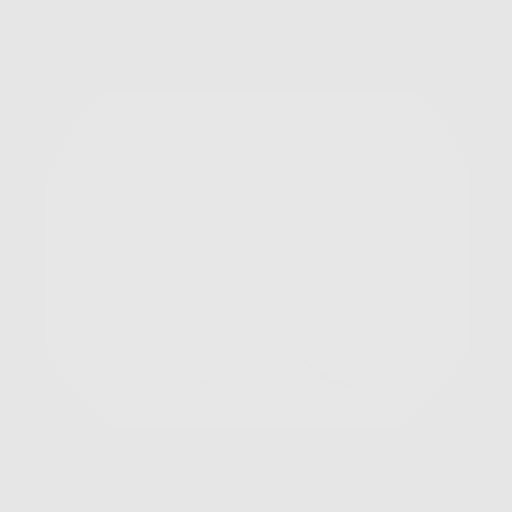
[frame 69/310  lung]
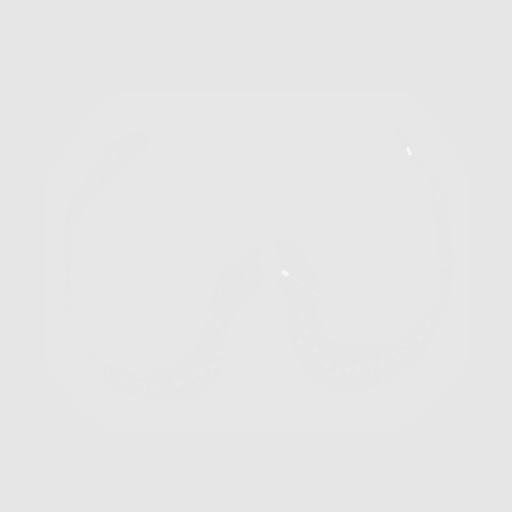
[frame 104/310  lung]
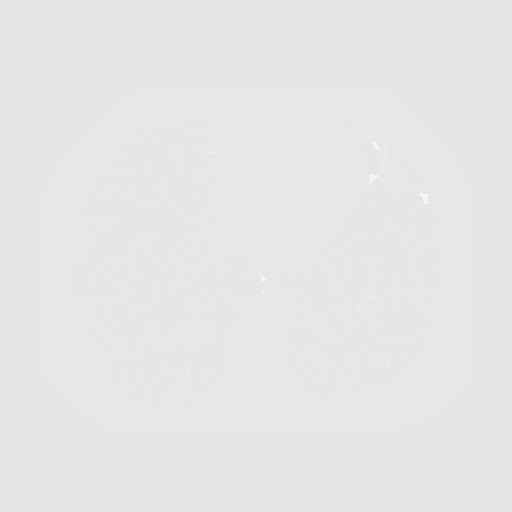
[frame 138/310  mediastinal]
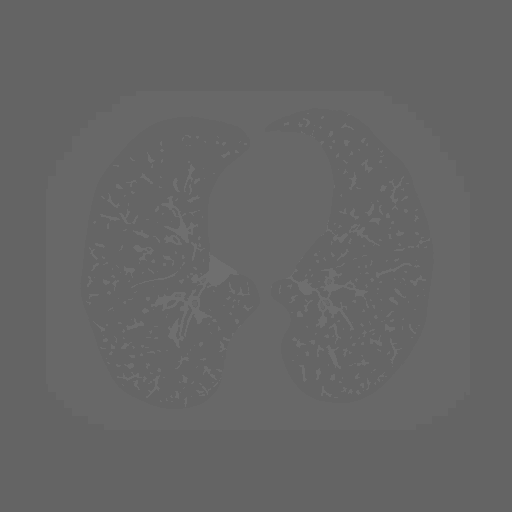
[frame 138/310  lung]
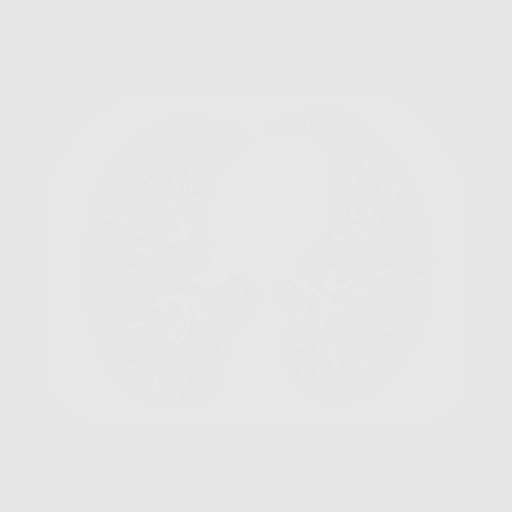
[frame 172/310  lung]
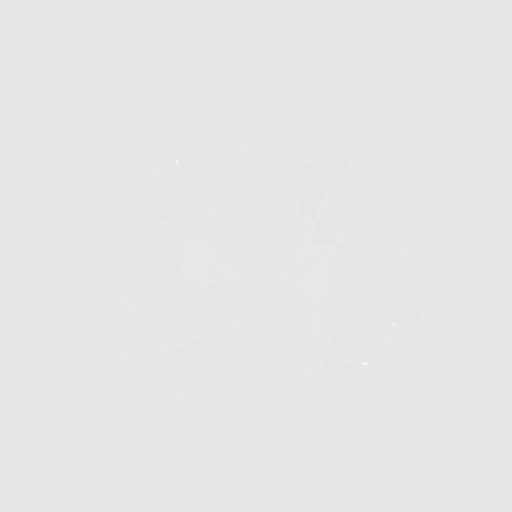
[frame 207/310  lung]
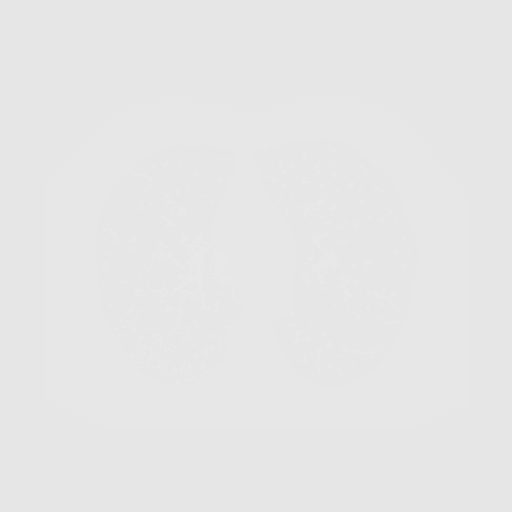
[frame 241/310  lung]
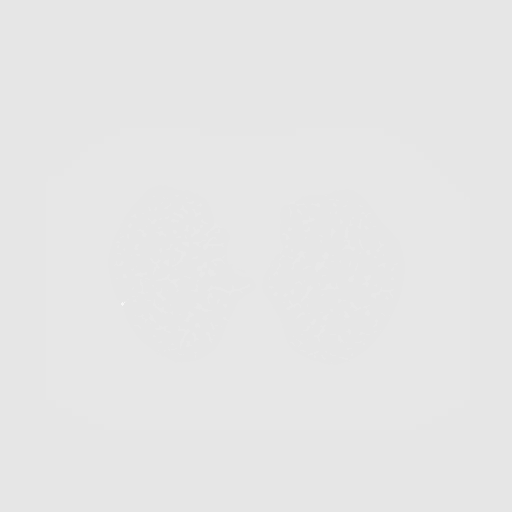
[frame 275/310  mediastinal]
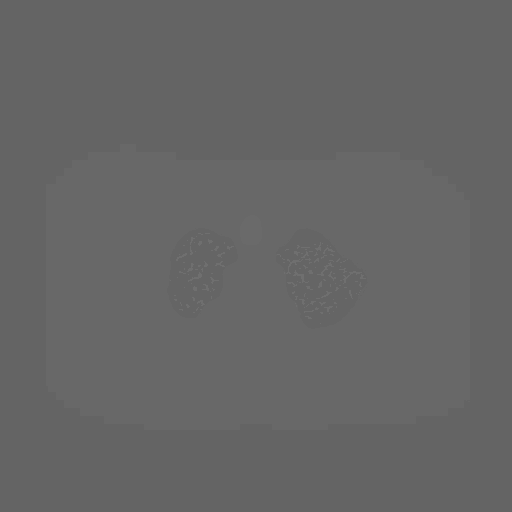
[frame 275/310  lung]
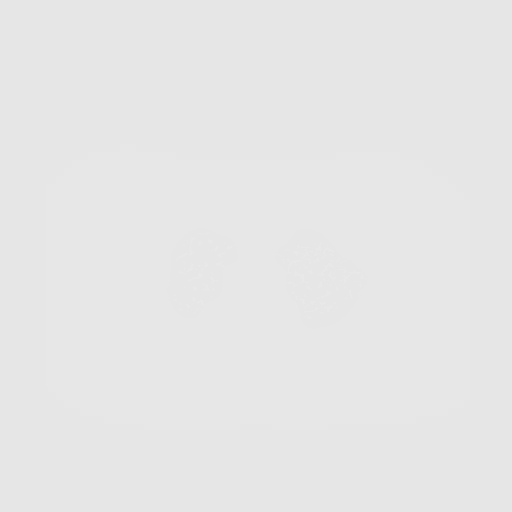
[frame 310/310  lung]
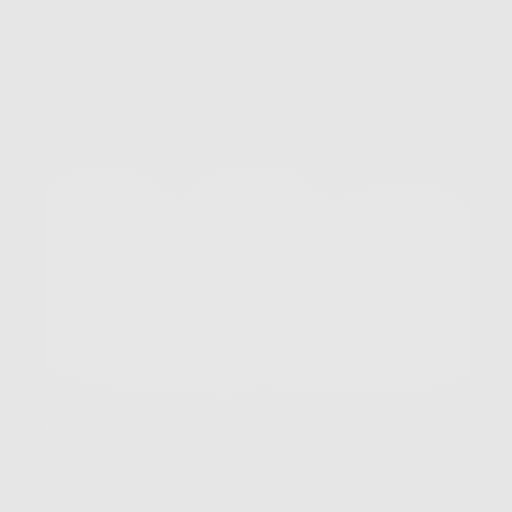

[10 of 20 positions shown; findings below may reference images not displayed]

FINDINGS: Cardiovascular: Heart size is normal. There is no significant
pericardial fluid, thickening or pericardial calcification. There is
aortic atherosclerosis, as well as atherosclerosis of the great
vessels of the mediastinum and the coronary arteries, including
calcified atherosclerotic plaque in the left anterior descending,
left circumflex and right coronary arteries. Mild calcifications of
the aortic valve.

Mediastinum/Nodes: No pathologically enlarged mediastinal or hilar
lymph nodes. Please note that accurate exclusion of hilar adenopathy
is limited on noncontrast CT scans. Esophagus is unremarkable in
appearance. No axillary lymphadenopathy.

Lungs/Pleura: Several small pulmonary nodules are noted throughout
the lungs bilaterally, largest of which is in the posterior aspect
of the left upper lobe (axial image 57 of series 3), with a volume
derived mean diameter of 4.5 mm. No other larger more suspicious
appearing pulmonary nodules or masses are noted. No acute
consolidative airspace disease. No pleural effusions. Diffuse
bronchial wall thickening with mild centrilobular and paraseptal
emphysema.

Upper Abdomen: Numerous calcified granulomas are noted in the
spleen. Aortic atherosclerosis.

Musculoskeletal: There are no aggressive appearing lytic or blastic
lesions noted in the visualized portions of the skeleton.
IMPRESSION: 1. Lung-RADS 2S, benign appearance or behavior. Continue annual
screening with low-dose chest CT without contrast in 12 months.
2. The "S" modifier above refers to potentially clinically
significant non lung cancer related findings. Specifically, there is
aortic atherosclerosis, in addition to 3 vessel coronary artery
disease. Please note that although the presence of coronary artery
calcium documents the presence of coronary artery disease, the
severity of this disease and any potential stenosis cannot be
assessed on this non-gated CT examination. Assessment for potential
risk factor modification, dietary therapy or pharmacologic therapy
may be warranted, if clinically indicated.
3. Mild diffuse bronchial wall thickening with mild centrilobular
and paraseptal emphysema; imaging findings suggestive of underlying
COPD.
4. There are calcifications of the aortic valve. Echocardiographic
correlation for evaluation of potential valvular dysfunction may be
warranted if clinically indicated.
5. Cholelithiasis without evidence of acute cholecystitis at this
time.

Aortic Atherosclerosis (ZZ57T-MYA.A) and Emphysema (ZZ57T-SIE.Z).

## 2023-01-12 ENCOUNTER — Telehealth: Payer: Self-pay | Admitting: Family Medicine

## 2023-01-12 NOTE — Telephone Encounter (Signed)
Caller was Aurther Loft, Estate manager/land agent for [facility]. Called stating they are no long able to fill the levothyroxine 150 mcg with the manufacturer [employer]. States they do have an equivalent manufacturer by the name of [employer]. Stated they asked patient for permission and he granted this, now all they need is approval from PCP.   Aurther Loft can be reached @ [phone]. Reference number is [id number].

## 2023-01-12 NOTE — Telephone Encounter (Signed)
Returned pharmacy call and gave VO to switch manufacturer.

## 2023-01-21 LAB — HM DIABETES EYE EXAM

## 2023-02-06 IMAGING — DX DG LUMBAR SPINE COMPLETE 4+V
3 series · 3 of 3 positions shown · non-contrast
Comparison: None.

CLINICAL DATA: Right low back pain, chronic for over 30 years with
recent worsening. No reported injury.

EXAM:
LUMBAR SPINE - COMPLETE 4+ VIEW

[lumbar spine ap]
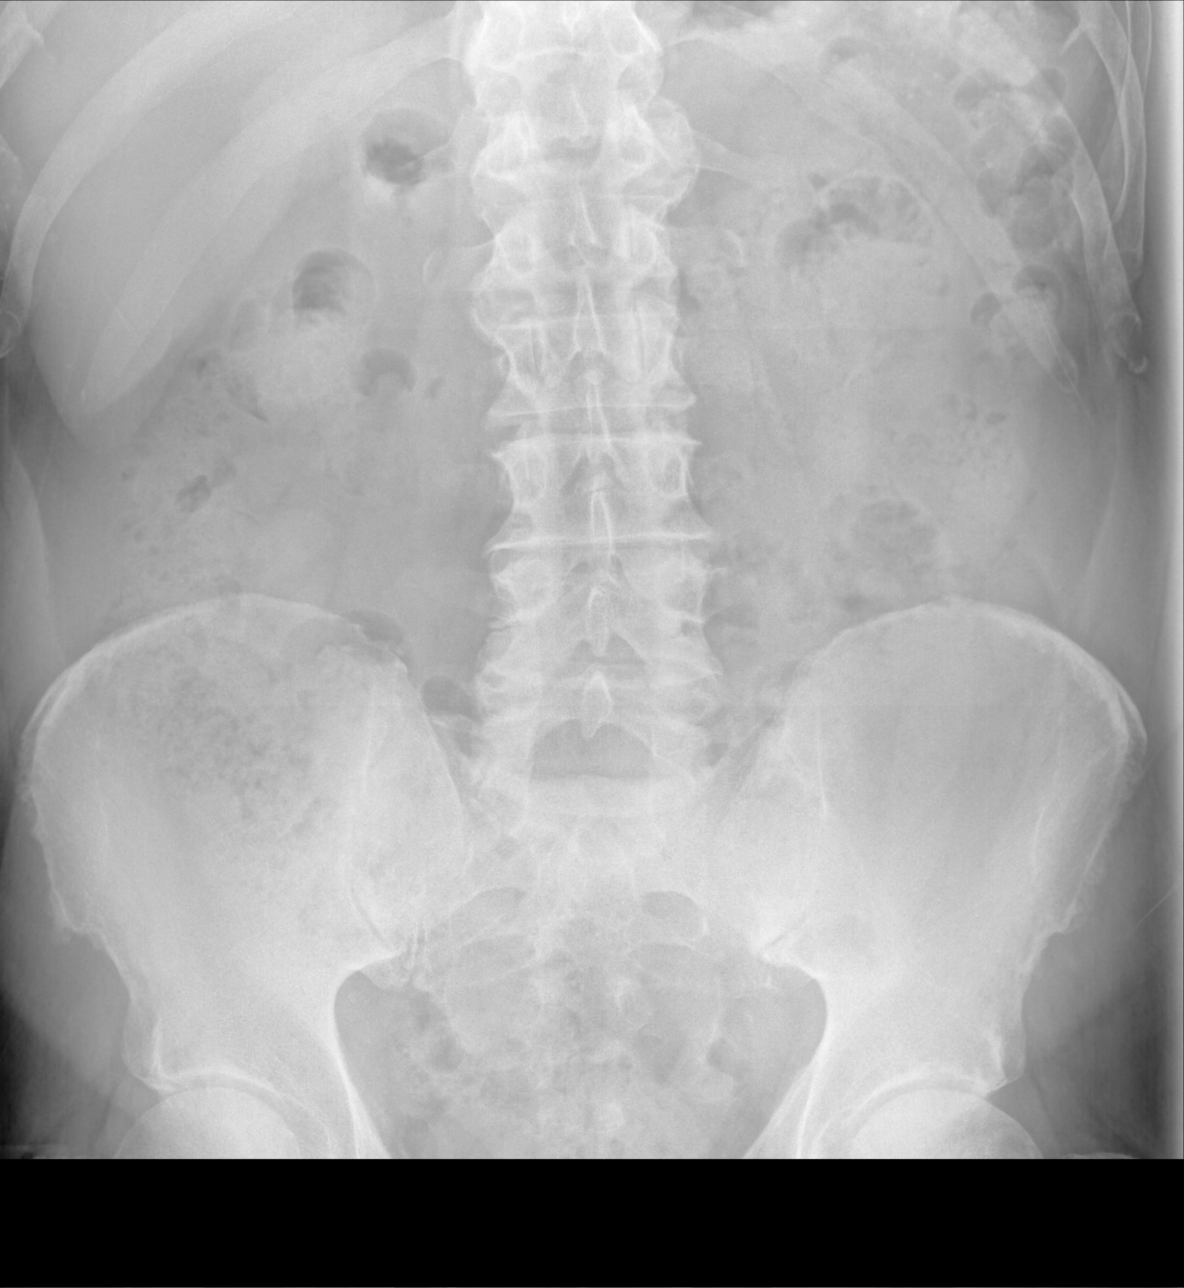

[lumbar spine lat (1 of 2)]
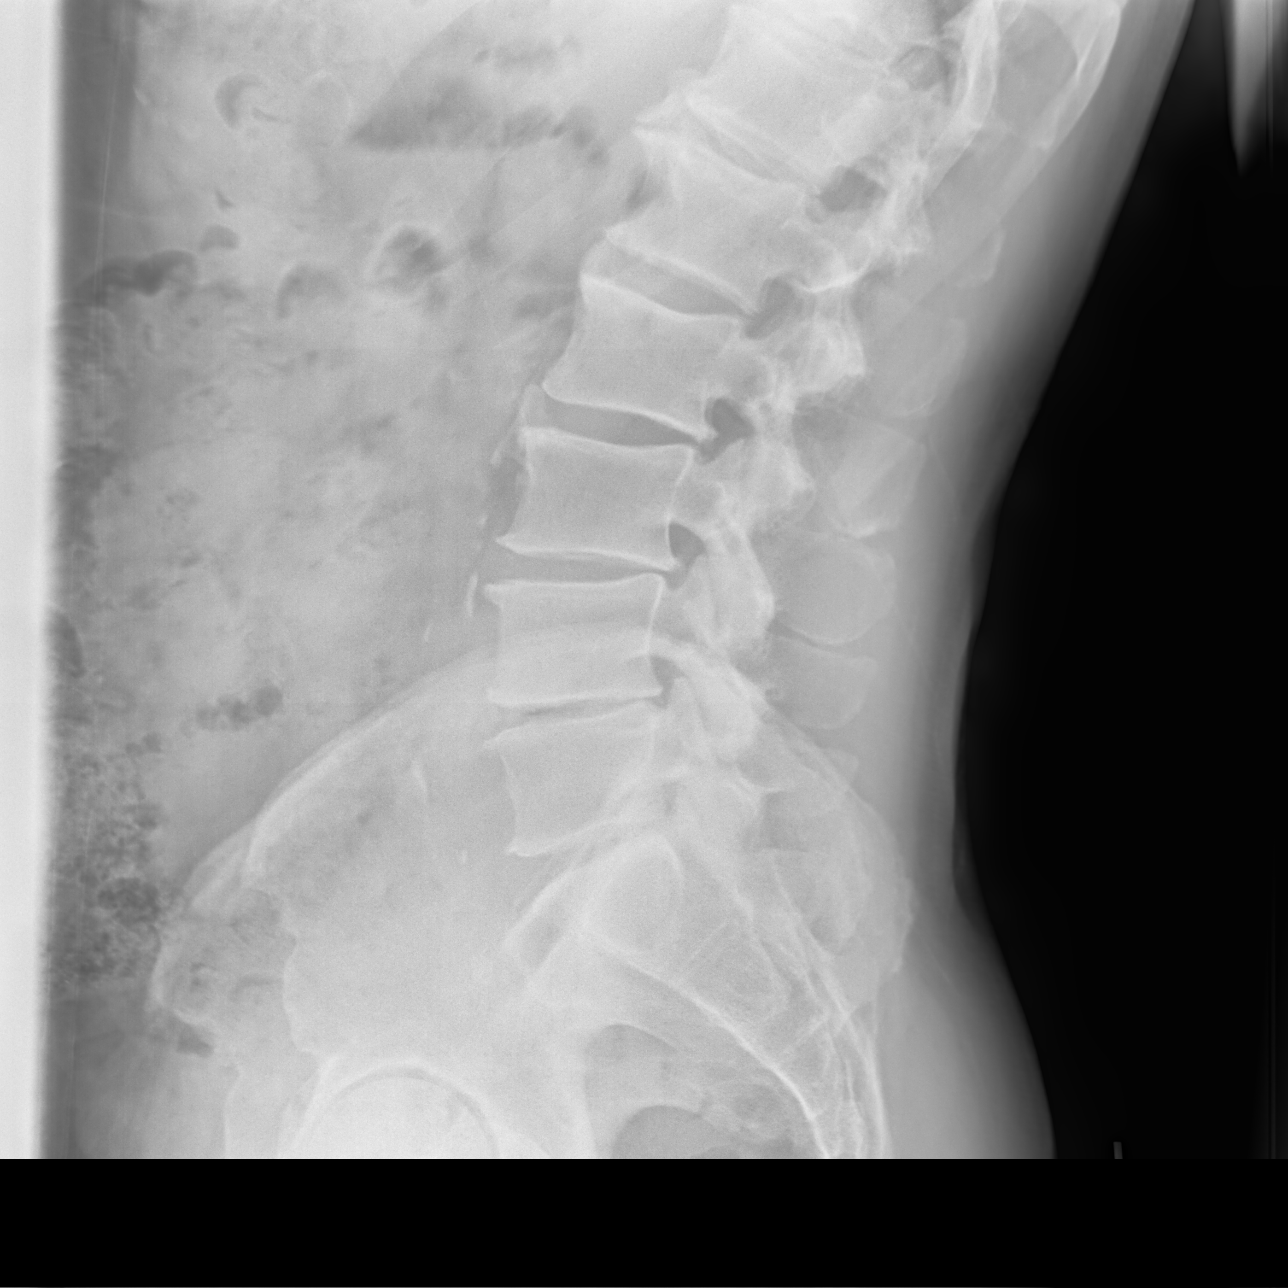

[lumbar spine lat (2 of 2)]
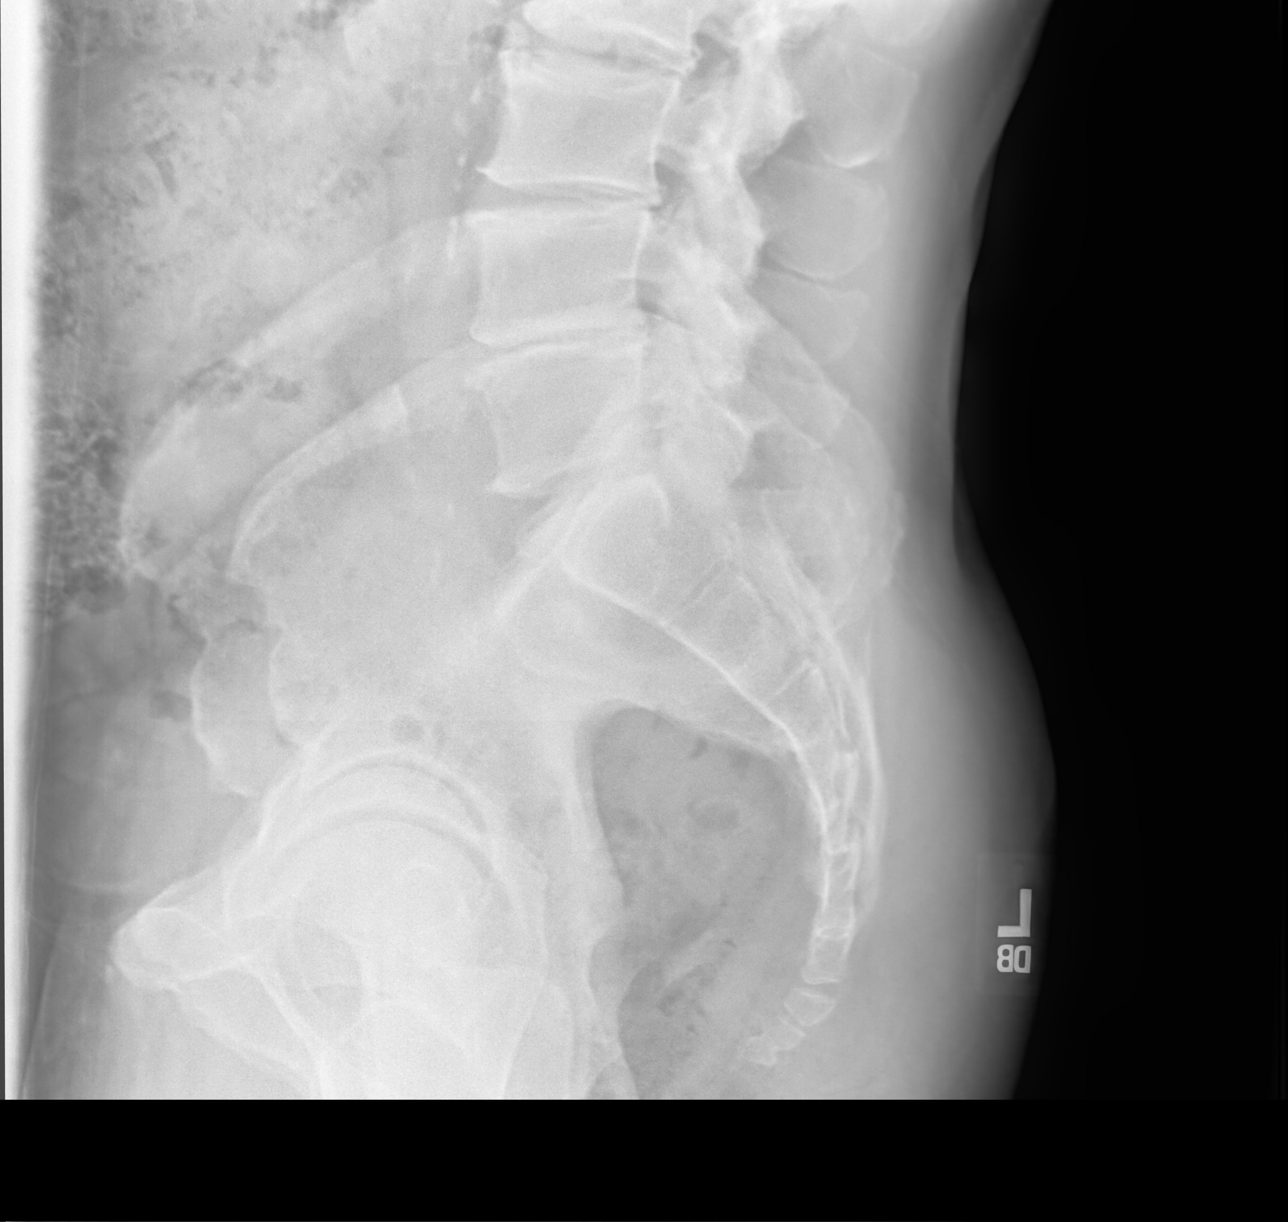

[3 of 3 positions shown; findings below may reference images not displayed]

FINDINGS: This report assumes 5 non rib-bearing lumbar vertebrae.

Lumbar vertebral body heights are preserved, with no fracture.

Moderate multilevel lumbar degenerative disc disease, most prominent
at T12-L1 and L4-5. Minimal 2 mm retrolisthesis at L3-4 and L4-5. No
significant facet arthropathy. No aggressive appearing focal osseous
lesions. Abdominal aortic atherosclerosis.
IMPRESSION: 1. No acute osseous abnormality.
2. Moderate multilevel lumbar degenerative disc disease.
3. Minimal lower lumbar retrolisthesis.

## 2023-03-20 ENCOUNTER — Other Ambulatory Visit: Payer: Self-pay | Admitting: Family Medicine

## 2023-04-10 ENCOUNTER — Ambulatory Visit (INDEPENDENT_AMBULATORY_CARE_PROVIDER_SITE_OTHER): Payer: Medicare Other | Admitting: Family Medicine

## 2023-04-10 ENCOUNTER — Encounter: Payer: Self-pay | Admitting: Family Medicine

## 2023-04-10 VITALS — BP 100/64 | HR 73 | Temp 98.3°F | Ht 71.0 in | Wt 187.8 lb

## 2023-04-10 DIAGNOSIS — E119 Type 2 diabetes mellitus without complications: Secondary | ICD-10-CM

## 2023-04-10 DIAGNOSIS — E785 Hyperlipidemia, unspecified: Secondary | ICD-10-CM | POA: Diagnosis not present

## 2023-04-10 DIAGNOSIS — E039 Hypothyroidism, unspecified: Secondary | ICD-10-CM | POA: Diagnosis not present

## 2023-04-10 DIAGNOSIS — Z125 Encounter for screening for malignant neoplasm of prostate: Secondary | ICD-10-CM

## 2023-04-10 DIAGNOSIS — E1169 Type 2 diabetes mellitus with other specified complication: Secondary | ICD-10-CM | POA: Diagnosis not present

## 2023-04-10 DIAGNOSIS — R350 Frequency of micturition: Secondary | ICD-10-CM | POA: Diagnosis not present

## 2023-04-10 LAB — URINALYSIS, ROUTINE W REFLEX MICROSCOPIC
Bilirubin Urine: NEGATIVE
Hgb urine dipstick: NEGATIVE
Ketones, ur: NEGATIVE
Leukocytes,Ua: NEGATIVE
Nitrite: NEGATIVE
Specific Gravity, Urine: 1.02 (ref 1.000–1.030)
Total Protein, Urine: NEGATIVE
Urine Glucose: NEGATIVE
Urobilinogen, UA: 0.2 (ref 0.0–1.0)
pH: 6 (ref 5.0–8.0)

## 2023-04-10 LAB — COMPREHENSIVE METABOLIC PANEL
ALT: 26 U/L (ref 0–53)
AST: 14 U/L (ref 0–37)
Albumin: 4.3 g/dL (ref 3.5–5.2)
Alkaline Phosphatase: 54 U/L (ref 39–117)
BUN: 15 mg/dL (ref 6–23)
CO2: 29 meq/L (ref 19–32)
Calcium: 9.7 mg/dL (ref 8.4–10.5)
Chloride: 104 meq/L (ref 96–112)
Creatinine, Ser: 0.99 mg/dL (ref 0.40–1.50)
GFR: 79.16 mL/min (ref >60.00)
Glucose, Bld: 108 mg/dL — ABNORMAL HIGH (ref 70–99)
Potassium: 4.6 meq/L (ref 3.5–5.1)
Sodium: 140 meq/L (ref 135–145)
Total Bilirubin: 0.6 mg/dL (ref 0.2–1.2)
Total Protein: 6.5 g/dL (ref 6.0–8.3)

## 2023-04-10 LAB — HEMOGLOBIN A1C: Hgb A1c MFr Bld: 6.6 % — ABNORMAL HIGH (ref 4.6–6.5)

## 2023-04-10 LAB — TSH: TSH: 1.89 u[IU]/mL (ref 0.35–5.50)

## 2023-04-10 LAB — PSA, MEDICARE: PSA: 0.95 ng/mL (ref 0.10–4.00)

## 2023-04-10 MED ORDER — VARENICLINE TARTRATE (STARTER) 0.5 MG X 11 & 1 MG X 42 PO TBPK
ORAL_TABLET | ORAL | 0 refills | Status: DC
Start: 2023-04-10 — End: 2023-04-10

## 2023-04-10 MED ORDER — VARENICLINE TARTRATE (STARTER) 0.5 MG X 11 & 1 MG X 42 PO TBPK: ORAL_TABLET | ORAL | 0 refills | Status: DC

## 2023-04-10 NOTE — Progress Notes (Signed)
Phone [phone] In person visit   Subjective:   Tracy Turner is a 67 y.o. year old very pleasant male patient who presents for/with See problem oriented charting Chief Complaint  Patient presents with   Medical Management of Chronic Issues   Diabetes    Past Medical History-  Patient Active Problem List   Diagnosis Date Noted   Smoker 05/06/2018    Priority: High   COPD (chronic obstructive pulmonary disease) (HCC)     Priority: High   Diabetes mellitus without complication (HCC) 08/22/2013    Priority: High   Hypothyroidism 07/20/2009    Priority: Medium    Hyperlipidemia associated with type 2 diabetes mellitus (HCC) 07/20/2009    Priority: Medium    Testosterone deficiency 08/22/2013    Priority: Low   GERD 01/21/2010    Priority: Low   Aortic atherosclerosis (HCC) 07/31/2020    Medications- reviewed and updated Current Outpatient Medications  Medication Sig Dispense Refill   aspirin EC 81 MG tablet Take 81 mg by mouth daily.     atorvastatin (LIPITOR) 40 MG tablet TAKE 1 TABLET BY MOUTH DAILY 100 tablet 2   levothyroxine (SYNTHROID) 150 MCG tablet TAKE 1 TABLET BY MOUTH DAILY 100 tablet 2   meloxicam (MOBIC) 15 MG tablet Take 1 tablet (15 mg total) by mouth daily. 40 tablet 0   metFORMIN (GLUCOPHAGE) 1000 MG tablet TAKE 1 TABLET BY MOUTH TWICE  DAILY 200 tablet 2   Multiple Vitamins-Minerals (MENS 50+ MULTI VITAMIN/MIN PO) Take 1 tablet by mouth daily.     pantoprazole (PROTONIX) 20 MG tablet TAKE 2 TABLETS BY MOUTH DAILY 200 tablet 2   sildenafil (VIAGRA) 100 MG tablet Take 0.5 tablets (50 mg total) by mouth as needed for erectile dysfunction. 30 tablet 6   metoprolol tartrate (LOPRESSOR) 25 MG tablet Take 1 tablet (25 mg total) by mouth daily as needed (rapid heart rate). (Patient not taking: Reported on 10/10/2022) 30 tablet 1   Varenicline Tartrate, Starter, (CHANTIX STARTING MONTH PAK) 0.5 MG X 11 & 1 MG X 42 TBPK Take one 0.5 mg tablet by mouth once daily for  3 days, then increase to one 0.5 mg tablet twice daily for 4 days, then increase to one 1 mg tablet twice daily. 53 each 0   No current facility-administered medications for this visit.     Objective:  BP 100/64   Pulse 73   Temp 98.3 F (36.8 C)   Ht 5\' 11"  (1.803 m)   Wt 187 lb 12.8 oz (85.2 kg)   SpO2 98%   BMI 26.19 kg/m  Gen: NAD, resting comfortably CV: RRR no murmurs rubs or gallops Lungs: CTAB no crackles, wheeze, rhonchi Ext: no edema Skin: warm, dry     Assessment and Plan   # Diabetes S: Medication: metformin 1g twice daily  -jardiance worsened urination - notes frequent urination- occasional incontinence CBGs- 90's to 120s at home in the morning. Stable weight Lab Results  Component Value Date   HGBA1C 6.6 (H) 05/30/2022   HGBA1C 6.6 (H) 02/03/2022   HGBA1C 8.0 (H) 10/14/2021  A/P: hopefully stable- update a1c today. Continue current meds for now   #hyperlipidemia #aortic atherosclerosis, 3 vessell coronary atherosclerosis per lung cancer screening 07/13/22 S: Medication: atorvastatin 40 mg daily Lab Results  Component Value Date   CHOL 107 10/10/2022   HDL 37.50 (L) 10/10/2022   LDLCALC 48 10/10/2022   LDLDIRECT 98.0 05/25/2020   TRIG 109.0 10/10/2022   CHOLHDL 3 10/10/2022  A/P: lipids have looked good even for aortic atherosclerosis and coronary calcium - LDL under 70- continue current medications   #Emphysema-incidental finding on lung cancer screening 07/13/22- no symptoms  -Previously quit smoking in 2014 but restarted after losing sister- 15 cigarettes a day or so. Wife smokes which makes it harder. By far one of best things for his health is to quit smoking- considering stopping first of the year. Chantix helpful before - sent in starter pack but I can send in continuing pack if needed- he sometimes does fine with just getting started  # SVT - presented to Emergency Department 09/27/22 S:medication: metoprolol 25 mg as needed for rapid heart rate    A/P: no further SVT incidents recently- continue to monitor - has metoprolol if needed  #hypothyroidism S: compliant On thyroid medication- levothyroxine 150 mcg . Manufacturer changed Lab Results  Component Value Date   TSH 1.79 10/10/2022  A/P:hopefully stable with new manufacturer- update tsh today. Continue current meds for now    # GERD S:Medication:  pantoprazole 40 mg--> 20 mg. Has 2nd available if needed A/P: stable- continue current medicines    #some neck tension anteriorly when wakes up- looking at getting new pillow to try to adjust  #for his back pain- headache(s) meloxicam but also sometimes uses ibuprofen- doesn't use together -has worked with Dr. Jean Rosenthal of sports medicine in past -still able to play golf- had to give full 3 months off to be able to heal  Recommended follow up:  keep welcome to medicare in january Future Appointments  Date Time Provider Department Center  05/14/2023  1:00 PM Shelva Majestic, MD [facility]   Lab/Order associations:   ICD-10-CM   1. Diabetes mellitus without complication (HCC)  E11.9 Hemoglobin A1c    Comprehensive metabolic panel    2. Acquired hypothyroidism  E03.9 TSH    3. Hyperlipidemia associated with type 2 diabetes mellitus (HCC)  E11.69    E78.5     4. Urinary frequency  R35.0 Urinalysis, Routine w reflex microscopic    5. Screening for prostate cancer  Z12.5 PSA, Medicare ( [facility] only)      Meds ordered this encounter  Medications   DISCONTD: Varenicline Tartrate, Starter, (CHANTIX STARTING MONTH PAK) 0.5 MG X 11 & 1 MG X 42 TBPK    Sig: Take one 0.5 mg tablet by mouth once daily for 3 days, then increase to one 0.5 mg tablet twice daily for 4 days, then increase to one 1 mg tablet twice daily.    Dispense:  53 each    Refill:  0   Varenicline Tartrate, Starter, (CHANTIX STARTING MONTH PAK) 0.5 MG X 11 & 1 MG X 42 TBPK    Sig: Take one 0.5 mg tablet by mouth once daily for 3 days, then increase to  one 0.5 mg tablet twice daily for 4 days, then increase to one 1 mg tablet twice daily.    Dispense:  53 each    Refill:  0    Return precautions advised.  Tana Conch, MD

## 2023-04-10 NOTE — Patient Instructions (Addendum)
Please stop by lab before you go If you have mychart- we will send your results within 3 business days of us receiving them.  If you do not have mychart- we will call you about results within 5 business days of us receiving them.  *please also note that you will see labs on mychart as soon as they post. I will later go in and write notes on them- will say "notes from Dr. Barnard Sharps"   Recommended follow up: Return for next already scheduled visit or sooner if needed. 

## 2023-05-14 ENCOUNTER — Encounter: Payer: Self-pay | Admitting: Family Medicine

## 2023-05-14 ENCOUNTER — Ambulatory Visit (INDEPENDENT_AMBULATORY_CARE_PROVIDER_SITE_OTHER): Payer: Medicare Other | Admitting: Family Medicine

## 2023-05-14 VITALS — BP 120/72 | HR 71 | Temp 98.4°F | Ht 71.0 in | Wt 193.2 lb

## 2023-05-14 DIAGNOSIS — Z0000 Encounter for general adult medical examination without abnormal findings: Secondary | ICD-10-CM | POA: Diagnosis not present

## 2023-05-14 DIAGNOSIS — E1169 Type 2 diabetes mellitus with other specified complication: Secondary | ICD-10-CM | POA: Diagnosis not present

## 2023-05-14 DIAGNOSIS — J449 Chronic obstructive pulmonary disease, unspecified: Secondary | ICD-10-CM | POA: Diagnosis not present

## 2023-05-14 DIAGNOSIS — Z7984 Long term (current) use of oral hypoglycemic drugs: Secondary | ICD-10-CM | POA: Diagnosis not present

## 2023-05-14 DIAGNOSIS — E119 Type 2 diabetes mellitus without complications: Secondary | ICD-10-CM

## 2023-05-14 DIAGNOSIS — E785 Hyperlipidemia, unspecified: Secondary | ICD-10-CM | POA: Diagnosis not present

## 2023-05-14 LAB — MICROALBUMIN / CREATININE URINE RATIO
Creatinine,U: 135.8 mg/dL
Microalb Creat Ratio: 0.5 mg/g (ref 0.0–30.0)
Microalb, Ur: <0.7 mg/dL (ref 0.0–1.9)

## 2023-05-14 NOTE — Patient Instructions (Addendum)
  Mr. Catena , Thank you for taking time to come for your Medicare Wellness Visit. I appreciate your ongoing commitment to your health goals. Please review the following plan we discussed and let me know if I can assist you in the future.   These are the goals we discussed: Remain cigarette free! Congrats!!!! Bring hcpoa and living will to be scanned in if desired 3. Update screenings including urine diabetes test  Please stop by lab before you go If you have mychart- we will send your results within 3 business days of us  receiving them.  If you do not have mychart- we will call you about results within 5 business days of us  receiving them.  *please also note that you will see labs on mychart as soon as they post. I will later go in and write notes on them- will say notes from Dr. Katrinka   This is a list of the screening recommended for you and due dates:  Health Maintenance  Topic Date Due   Yearly kidney health urinalysis for diabetes  05/31/2023   Pneumonia Vaccine (2 of 2 - PCV) 05/31/2023*   Zoster (Shingles) Vaccine (1 of 2) 07/09/2023*   Flu Shot  08/03/2023*   DTaP/Tdap/Td vaccine (2 - Td or Tdap) 07/30/2023   Hemoglobin A1C  10/09/2023   Complete foot exam   10/10/2023   Eye exam for diabetics  01/21/2024   Yearly kidney function blood test for diabetes  04/09/2024   Medicare Annual Wellness Visit  05/13/2024   Colon Cancer Screening  04/09/2027   Hepatitis C Screening  Completed   HPV Vaccine  Aged Out   Screening for Lung Cancer  Discontinued   COVID-19 Vaccine  Discontinued  *Topic was postponed. The date shown is not the original due date.    Recommended follow up: Return in about 6 months (around 11/11/2023) for followup or sooner if needed.Schedule b4 you leave.

## 2023-05-14 NOTE — Progress Notes (Signed)
 Phone: [phone]   Subjective:  Patient presents today for their Welcome to Medicare Exam    Preventive Screening-Counseling & Management  Vision screen: normal Vision Screening   Right eye Left eye Both eyes  Without correction 20/25 20/25 20/20   With correction      Advanced directives: full code, reports has living will, hcpoa wife- brother would be next- updated this last year- discussed could bring by copy  Modifiable Risk Factors/behavioral risk assessment/psychosocial risk assessment Regular exercise: Has been a challenge with back pain issues- plans to start next week. With [facility] will get free membership [employer] Diet: Appetite has been lower but since quitting smoking appears this is increased-weight up a few pounds-we discussed healthy eating/or exercise and likely weight maintenance primarily  Wt Readings from Last 3 Encounters:  05/14/23 193 lb 3.2 oz (87.6 kg)  04/10/23 187 lb 12.8 oz (85.2 kg)  10/10/22 185 lb 6.4 oz (84.1 kg)   Smoking Status: Former smoker-quit smoking 1 month ago!  He is enrolled in lung cancer screening program. Got Chantix  from [facility] starter pack- full dose in am now and half dose at night (gas and dreams) and he's considering tsopping- feeling no urges. j Second Hand Smoking status: wife smokes but outside the home Alcohol intake: Very rare intake Other substance abuse/illicit drugs: None  Cardiac risk factors:  advanced age (older than 65 for men, 52 for women)  Treated hyperlipidemia  no hypertension  Controlled diabetes Known CAD with three-vessel coronary atherosclerosis per lung cancer screening on 07/13/2022  Family History: Mother's history contributory He recently quit smoking which lowers risk Family History  Problem Relation Age of Onset   Heart disease Mother    Dementia Mother        on hospice early 11-died from COVID-19 in rehab-got sick from nurse   Heart disease Father        died at 35   Stroke Father     Hyperlipidemia Father    Obesity Sister        unknown cause of death- possible cardiac   Drug abuse Sister    Hypertension Sister    Hypertension Brother    Colon cancer Neg Hx     Depression Screen/risk evaluation Risk factors: None. PHQ 9 of 0    05/14/2023   12:38 PM 08/19/2022    8:05 AM 05/30/2022    2:22 PM 10/08/2021    3:06 PM 07/30/2020    2:11 PM  Depression screen PHQ 2/9  Decreased Interest 0 0 0 0 0  Down, Depressed, Hopeless 0 0 0 0 0  PHQ - 2 Score 0 0 0 0 0  Altered sleeping 0 0 0 0   Tired, decreased energy 0 0 0 0   Change in appetite 0 0 0 0   Feeling bad or failure about yourself  0 0 0 0   Trouble concentrating 0 0 0 0   Moving slowly or fidgety/restless 0 0 0 0   Suicidal thoughts 0 0 0 0   PHQ-9 Score 0 0 0 0   Difficult doing work/chores Not difficult at all Not difficult at all Not difficult at all Not difficult at all     Functional ability and level of safety Mobility assessment:  timed get up and go <12 seconds Activities of Daily Living- Independent in ADLs (toileting, bathing, dressing, transferring, eating) and in IADLs (shopping, housekeeping, managing own medications, and handling finances) Home Safety: Loose rugs (none), smoke detectors (up to  date ), small pets (yes but no falls), grab bars (not needed yet), stairs (1 set of steps with no issues), life-alert system (would use cell phone- keeps on person most of time) Hearing Difficulties: -patient declines Fall Risk: None     05/14/2023   12:38 PM 08/19/2022    8:05 AM 10/08/2021    3:06 PM 07/30/2020    2:11 PM 05/25/2020    9:48 AM  Fall Risk   Falls in the past year? 0 0 0 0 0  Number falls in past yr: 0 0 0 0 0  Injury with Fall? 0 0 0 0 0  Risk for fall due to : No Fall Risks No Fall Risks No Fall Risks    Follow up Falls evaluation completed Falls evaluation completed Falls evaluation completed     Opioid use history:  no long term opioids use Self assessment of health status:  good  Required Immunizations needed today:  Recommended Prevnar 20- wants to hold off, declines Shingrix and flu, declines COVID-19 vaccination  Immunization History  Administered Date(s) Administered   Influenza Inj Mdck Quad Pf 03/16/2018   Influenza Split 03/06/2011   Influenza, Quadrivalent, Recombinant, Inj, Pf 02/27/2019   Influenza,inj,Quad PF,6+ Mos 04/04/2013, 04/01/2017   Influenza-Unspecified 02/02/2014, 02/27/2015, 02/07/2016, 02/16/2018   Pneumococcal Polysaccharide-23 05/06/2018   Tdap 07/29/2013   Health Maintenance  Topic Date Due   Yearly kidney health urinalysis for diabetes  05/31/2023   Pneumonia Vaccine (2 of 2 - PCV) 05/31/2023*   Zoster (Shingles) Vaccine (1 of 2) 07/09/2023*   Flu Shot  08/03/2023*   DTaP/Tdap/Td vaccine (2 - Td or Tdap) 07/30/2023   Hemoglobin A1C  10/09/2023   Complete foot exam   10/10/2023   Eye exam for diabetics  01/21/2024   Yearly kidney function blood test for diabetes  04/09/2024   Medicare Annual Wellness Visit  05/13/2024   Colon Cancer Screening  04/09/2027   Hepatitis C Screening  Completed   HPV Vaccine  Aged Out   Screening for Lung Cancer  Discontinued   COVID-19 Vaccine  Discontinued  *Topic was postponed. The date shown is not the original due date.   Screening tests-microalbumin to creatinine ratio today Colon cancer screening- 04/08/2017 with 10 year repeat  Lung Cancer screening- already 3nrolled.  Abdominal aortic aneurysm screening 10/13/22 - AAA not formally ruled out 10/13/2022 due to bowel gas-repeat 3 years Skin cancer screening- follows with Dr. Towana as needed-has been seen in the last year. May update soon Prostate cancer screening-low risk prostate cancer based on PSA trend.  Lab Results  Component Value Date   PSA 0.95 04/10/2023   PSA 1.46 10/10/2022   PSA 0.95 10/14/2021   The following were reviewed and entered/updated in epic: Past Medical History:  Diagnosis Date   COPD (chronic obstructive  pulmonary disease) (HCC)    mild   Diabetes mellitus without complication (HCC)    more controlled A1C after 30 # weight lost   GERD (gastroesophageal reflux disease)    History of colonic polyps    Hyperlipidemia    Hypothyroidism    NONGONOCOCCAL URETHRITIS 10/12/2009   Qualifier: Diagnosis of  By: Jame  MD, Maude FALCON    Tobacco abuse    Patient Active Problem List   Diagnosis Date Noted   Smoker 05/06/2018    Priority: High   COPD (chronic obstructive pulmonary disease) (HCC)     Priority: High   Diabetes mellitus without complication (HCC) 08/22/2013    Priority:  High   Hypothyroidism 07/20/2009    Priority: Medium    Hyperlipidemia associated with type 2 diabetes mellitus (HCC) 07/20/2009    Priority: Medium    Testosterone  deficiency 08/22/2013    Priority: Low   GERD 01/21/2010    Priority: Low   Aortic atherosclerosis (HCC) 07/31/2020   Past Surgical History:  Procedure Laterality Date   COLONOSCOPY  2008   ETT  2007   INGUINAL HERNIA REPAIR Bilateral 05/25/2014   Procedure: BILATERAL LAPAROSCOPIC INGUINAL HERNIA WITH MESH;  Surgeon: Elspeth Schultze, MD;  Location: [facility];  Service: General;  Laterality: Bilateral;   INSERTION OF MESH Bilateral 05/25/2014   Procedure: INSERTION OF MESH;  Surgeon: Elspeth Schultze, MD;  Location: [facility];  Service: General;  Laterality: Bilateral;   TOOTH EXTRACTION     several -molars    Family History  Problem Relation Age of Onset   Heart disease Mother    Dementia Mother        on hospice early 63-died from COVID-19 in rehab-got sick from nurse   Heart disease Father        died at 42   Stroke Father    Hyperlipidemia Father    Obesity Sister        unknown cause of death- possible cardiac   Drug abuse Sister    Hypertension Sister    Hypertension Brother    Colon cancer Neg Hx     Medications- reviewed and updated Current Outpatient Medications  Medication Sig Dispense Refill   aspirin EC 81 MG tablet Take 81 mg  by mouth daily.     atorvastatin  (LIPITOR) 40 MG tablet TAKE 1 TABLET BY MOUTH DAILY 100 tablet 2   levothyroxine  (SYNTHROID ) 150 MCG tablet TAKE 1 TABLET BY MOUTH DAILY 100 tablet 2   meloxicam  (MOBIC ) 15 MG tablet Take 1 tablet (15 mg total) by mouth daily. 40 tablet 0   metFORMIN  (GLUCOPHAGE ) 1000 MG tablet TAKE 1 TABLET BY MOUTH TWICE  DAILY 200 tablet 2   metoprolol  tartrate (LOPRESSOR ) 25 MG tablet Take 1 tablet (25 mg total) by mouth daily as needed (rapid heart rate). (Patient not taking: Reported on 10/10/2022) 30 tablet 1   Multiple Vitamins-Minerals (MENS 50+ MULTI VITAMIN/MIN PO) Take 1 tablet by mouth daily.     pantoprazole  (PROTONIX ) 20 MG tablet TAKE 2 TABLETS BY MOUTH DAILY 200 tablet 2   sildenafil  (VIAGRA ) 100 MG tablet Take 0.5 tablets (50 mg total) by mouth as needed for erectile dysfunction. 30 tablet 6   Varenicline  Tartrate, Starter, (CHANTIX  STARTING MONTH PAK) 0.5 MG X 11 & 1 MG X 42 TBPK Take one 0.5 mg tablet by mouth once daily for 3 days, then increase to one 0.5 mg tablet twice daily for 4 days, then increase to one 1 mg tablet twice daily. 53 each 0   No current facility-administered medications for this visit.    Allergies-reviewed and updated No Known Allergies  Social History   Socioeconomic History   Marital status: Married    Spouse name: Not on file   Number of children: Not on file   Years of education: Not on file   Highest education level: Bachelor's degree (e.g., BA, AB, BS)  Occupational History   Not on file  Tobacco Use   Smoking status: Every Day    Current packs/day: 0.00    Average packs/day: 0.5 packs/day for 35.0 years (17.5 ttl pk-yrs)    Types: Cigarettes    Start date: 08/30/1977  Last attempt to quit: 08/30/2012    Years since quitting: 10.7   Smokeless tobacco: Never   Tobacco comments:    using Chantix .SABRA..40 years on and off; 05-22-14 now smoking again < 1/2 ppd  Substance and Sexual Activity   Alcohol use: Not Currently     Comment: occ. maybe every month    Drug use: No   Sexual activity: Yes  Other Topics Concern   Not on file  Social History Narrative   Married. 3 children. 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    Prior landscaping as a side business      Hobbies: golf   Social Drivers of Corporate Investment Banker Strain: Low Risk  (04/09/2023)- unchanged    Overall Financial Resource Strain (CARDIA)    Difficulty of Paying Living Expenses: Not hard at all  Food Insecurity: No Food Insecurity (04/09/2023)- unchanged   Hunger Vital Sign    Worried About Running Out of Food in the Last Year: Never true    Ran Out of Food in the Last Year: Never true  Transportation Needs: No Transportation Needs (04/09/2023)- unchanged   PRAPARE - Administrator, Civil Service (Medical): No    Lack of Transportation (Non-Medical): No  Physical Activity: Insufficiently Active (04/09/2023)- unchanged- encouraged to increase   Exercise Vital Sign    Days of Exercise per Week: 3 days    Minutes of Exercise per Session: 30 min  Stress: No Stress Concern Present (04/09/2023)- unchanged   [employer] of Occupational Health - Occupational Stress Questionnaire    Feeling of Stress : Not at all  Social Connections: Moderately Isolated (04/09/2023)- he feels has adequate support   Doctor, General Practice [NHANES]    Frequency of Communication with Friends and Family: More than three times a week    Frequency of Social Gatherings with Friends and Family: Once a week    Attends Religious Services: Never    Database Administrator or Organizations: No    Marital Status: Married   Objective  Objective:  BP 120/72   Pulse 71   Temp 98.4 F (36.9 C)   Ht 5' 11 (1.803 m)   Wt 193 lb 3.2 oz (87.6 kg)   SpO2 96%   BMI 26.95 kg/m  Gen: NAD, resting comfortably HEENT: Mucous membranes are moist. Oropharynx normal Neck: no  thyromegaly CV: RRR no murmurs rubs or gallops Lungs: CTAB no crackles, wheeze, rhonchi Abdomen: soft/nontender/nondistended/normal bowel sounds. No rebound or guarding.  Ext: no edema Skin: warm, dry Neuro: grossly normal, moves all extremities, PERRLA    Assessment and Plan:   Welcome to Medicare exam completed-  Educated, counseled and referred based on above elements Educated, counseled and referred as appropriate for preventative needs Discussed and documented a written plan for preventiative services and screenings with personalized health advice- After Visit Summary was given to patient which included this plan  4. EKG offered [id number]- patient opts out- had last may  Status of chronic or acute concerns   # Diabetes S: Medication: metformin  1g twice daily  -jardiance  worsened urination CBGs- sugars have been up some into the 120s now. Was lower during Christmas interestingly enough Exercise and diet- weight up  after quitting smoking Lab Results  Component Value Date   HGBA1C 6.6 (H) 04/10/2023  A/P: hopefully stable- too soon for repeat and sugars slightly higher  #  hyperlipidemia #aortic atherosclerosis, 3 vessell coronary atherosclerosis per lung cancer screening 07/13/22 S: Medication: atorvastatin  40 mg daily  Lab Results  Component Value Date   CHOL 107 10/10/2022   HDL 37.50 (L) 10/10/2022   LDLCALC 48 10/10/2022   LDLDIRECT 98.0 05/25/2020   TRIG 109.0 10/10/2022   CHOLHDL 3 10/10/2022   A/P: aortic atherosclerosis (presumed stable)- LDL goal ideally <70 - at goal- continue current medications   #Emphysema-incidental finding on lung cancer screening 07/13/22. No shortness of breath or wheezing  -Previously quit smoking in 2014 but restarted after losing sister- has quit again though!   -less coughing/hacking with quitting smoking!   # SVT - presented to Emergency Department 09/27/22 S:medication: metoprolol  25 mg as needed for rapid heart rate   A/P: has  not needed medicine  #hypothyroidism S: compliant On thyroid  medication- levothyroxine  150 mcg  Lab Results  Component Value Date   TSH 1.89 04/10/2023  A/P:stable- continue current medicines    # GERD S:Medication:  pantoprazole  40 mg--> 20 mg A/P: stable- continue current medicines.    #erectile dysfunction (ED) - sildenafil  helpful - needs very sparingly  #MUSCULOSKELETAL- has seen Dr. Leonce - history of right low back pain with known degenerative disk disease  -mr lumbar spine 08/23/22 -tizanidine  not very helpful  Recommended follow up: 6 month follow up   Lab/Order associations:   ICD-10-CM   1. Welcome to Medicare preventive visit  Z00.00     2. Diabetes mellitus without complication (HCC)  E11.9 Microalbumin / creatinine urine ratio    3. Chronic obstructive pulmonary disease, unspecified COPD type (HCC) Chronic J44.9     4. Hyperlipidemia associated with type 2 diabetes mellitus (HCC) Chronic E11.69    E78.5       No orders of the defined types were placed in this encounter.   Return precautions advised. Garnette Lukes, MD

## 2023-07-13 ENCOUNTER — Ambulatory Visit
Admission: RE | Admit: 2023-07-13 | Discharge: 2023-07-13 | Disposition: A | Discharge status: Home / Self Care | Payer: Medicare Other | Source: Ambulatory Visit | Attending: Family Medicine | Admitting: Family Medicine

## 2023-07-13 DIAGNOSIS — Z122 Encounter for screening for malignant neoplasm of respiratory organs: Secondary | ICD-10-CM | POA: Diagnosis not present

## 2023-07-13 DIAGNOSIS — F17210 Nicotine dependence, cigarettes, uncomplicated: Secondary | ICD-10-CM | POA: Diagnosis not present

## 2023-07-13 DIAGNOSIS — Z87891 Personal history of nicotine dependence: Secondary | ICD-10-CM

## 2023-08-10 ENCOUNTER — Other Ambulatory Visit: Payer: Self-pay | Admitting: Acute Care

## 2023-08-10 DIAGNOSIS — Z87891 Personal history of nicotine dependence: Secondary | ICD-10-CM

## 2023-08-10 DIAGNOSIS — Z122 Encounter for screening for malignant neoplasm of respiratory organs: Secondary | ICD-10-CM

## 2023-08-10 DIAGNOSIS — F17210 Nicotine dependence, cigarettes, uncomplicated: Secondary | ICD-10-CM

## 2023-09-08 DIAGNOSIS — D225 Melanocytic nevi of trunk: Secondary | ICD-10-CM | POA: Diagnosis not present

## 2023-09-08 DIAGNOSIS — L578 Other skin changes due to chronic exposure to nonionizing radiation: Secondary | ICD-10-CM | POA: Diagnosis not present

## 2023-09-08 DIAGNOSIS — L821 Other seborrheic keratosis: Secondary | ICD-10-CM | POA: Diagnosis not present

## 2023-09-08 DIAGNOSIS — L570 Actinic keratosis: Secondary | ICD-10-CM | POA: Diagnosis not present

## 2023-09-08 DIAGNOSIS — L814 Other melanin hyperpigmentation: Secondary | ICD-10-CM | POA: Diagnosis not present

## 2023-09-24 ENCOUNTER — Encounter: Payer: Self-pay | Admitting: Family Medicine

## 2023-09-24 ENCOUNTER — Ambulatory Visit: Admitting: Family Medicine

## 2023-09-24 MED ORDER — SILDENAFIL CITRATE 100 MG PO TABS
100.0000 mg | ORAL_TABLET | ORAL | 3 refills | Status: AC | PRN
Start: 2023-09-24 — End: ?

## 2023-09-24 NOTE — Progress Notes (Signed)
 Patient is up-to-date on his regular visits.  He requested sildenafil  refill but has been several years from last refill so appears recommendation was for him to schedule a visit per [facility] -I told him as up-to-date on visits he does not need a visit today-LOS no charge is planned.  Refilled sildenafil -he will continue to take half but can take a full tablet if needed  I apologized for the inconvenience today and look forward to see him at his regularly scheduled visit in July

## 2023-11-05 ENCOUNTER — Encounter: Payer: Medicare Other | Admitting: Family Medicine

## 2023-11-17 ENCOUNTER — Encounter: Payer: Self-pay | Admitting: Family Medicine

## 2023-11-17 ENCOUNTER — Ambulatory Visit: Admitting: Family Medicine

## 2023-11-17 ENCOUNTER — Ambulatory Visit: Payer: Self-pay | Admitting: Family Medicine

## 2023-11-17 VITALS — BP 110/72 | HR 66 | Temp 97.8°F | Ht 71.0 in | Wt 199.0 lb

## 2023-11-17 DIAGNOSIS — E119 Type 2 diabetes mellitus without complications: Secondary | ICD-10-CM | POA: Diagnosis not present

## 2023-11-17 DIAGNOSIS — E039 Hypothyroidism, unspecified: Secondary | ICD-10-CM

## 2023-11-17 DIAGNOSIS — E785 Hyperlipidemia, unspecified: Secondary | ICD-10-CM

## 2023-11-17 DIAGNOSIS — E1169 Type 2 diabetes mellitus with other specified complication: Secondary | ICD-10-CM

## 2023-11-17 DIAGNOSIS — Z7984 Long term (current) use of oral hypoglycemic drugs: Secondary | ICD-10-CM

## 2023-11-17 LAB — LIPID PANEL
Cholesterol: 137 mg/dL (ref 0–200)
HDL: 40.2 mg/dL (ref >39.00)
LDL Cholesterol: 70 mg/dL (ref 0–99)
NonHDL: 96.59
Total CHOL/HDL Ratio: 3
Triglycerides: 134 mg/dL (ref 0.0–149.0)
VLDL: 26.8 mg/dL (ref 0.0–40.0)

## 2023-11-17 LAB — TSH: TSH: 2.64 u[IU]/mL (ref 0.35–5.50)

## 2023-11-17 LAB — CBC WITH DIFFERENTIAL/PLATELET
Basophils Absolute: 0 K/uL (ref 0.0–0.1)
Basophils Relative: 0.9 % (ref 0.0–3.0)
Eosinophils Absolute: 0.2 K/uL (ref 0.0–0.7)
Eosinophils Relative: 4.9 % (ref 0.0–5.0)
HCT: 40.8 % (ref 39.0–52.0)
Hemoglobin: 13.5 g/dL (ref 13.0–17.0)
Lymphocytes Relative: 18 % (ref 12.0–46.0)
Lymphs Abs: 0.8 K/uL (ref 0.7–4.0)
MCHC: 33.2 g/dL (ref 30.0–36.0)
MCV: 90.3 fl (ref 78.0–100.0)
Monocytes Absolute: 0.5 K/uL (ref 0.1–1.0)
Monocytes Relative: 9.8 % (ref 3.0–12.0)
Neutro Abs: 3.1 K/uL (ref 1.4–7.7)
Neutrophils Relative %: 66.4 % (ref 43.0–77.0)
Platelets: 243 K/uL (ref 150.0–400.0)
RBC: 4.51 Mil/uL (ref 4.22–5.81)
RDW: 13.6 % (ref 11.5–15.5)
WBC: 4.7 K/uL (ref 4.0–10.5)

## 2023-11-17 LAB — COMPREHENSIVE METABOLIC PANEL WITH GFR
ALT: 25 U/L (ref 0–53)
AST: 16 U/L (ref 0–37)
Albumin: 4.5 g/dL (ref 3.5–5.2)
Alkaline Phosphatase: 53 U/L (ref 39–117)
BUN: 27 mg/dL — ABNORMAL HIGH (ref 6–23)
CO2: 26 meq/L (ref 19–32)
Calcium: 9.5 mg/dL (ref 8.4–10.5)
Chloride: 104 meq/L (ref 96–112)
Creatinine, Ser: 1.27 mg/dL (ref 0.40–1.50)
GFR: 58.46 mL/min — ABNORMAL LOW (ref >60.00)
Glucose, Bld: 121 mg/dL — ABNORMAL HIGH (ref 70–99)
Potassium: 4.8 meq/L (ref 3.5–5.1)
Sodium: 137 meq/L (ref 135–145)
Total Bilirubin: 0.5 mg/dL (ref 0.2–1.2)
Total Protein: 7.1 g/dL (ref 6.0–8.3)

## 2023-11-17 LAB — MICROALBUMIN / CREATININE URINE RATIO
Creatinine,U: 125.3 mg/dL
Microalb Creat Ratio: 5.8 mg/g (ref 0.0–30.0)
Microalb, Ur: 0.7 mg/dL (ref 0.0–1.9)

## 2023-11-17 LAB — HEMOGLOBIN A1C: Hgb A1c MFr Bld: 7.3 % — ABNORMAL HIGH (ref 4.6–6.5)

## 2023-11-17 NOTE — Patient Instructions (Addendum)
  Please stop by lab before you go If you have mychart- we will send your results within 3 business days of us  receiving them.  If you do not have mychart- we will call you about results within 5 business days of us  receiving them.  *please also note that you will see labs on mychart as soon as they post. I will later go in and write notes on them- will say notes from Dr. Katrinka   Recommended follow up: Return in about 6 months (around 05/19/2024) for physical or sooner if needed.Schedule b4 you leave.

## 2023-11-17 NOTE — Progress Notes (Signed)
 Phone [phone] In person visit   Subjective:   Tracy Turner is a 67 y.o. year old very pleasant male patient who presents for/with See problem oriented charting Chief Complaint  Patient presents with   Medical Management of Chronic Issues   Diabetes   Hypothyroidism    Past Medical History-  Patient Active Problem List   Diagnosis Date Noted   Smoker 05/06/2018    Priority: High   COPD (chronic obstructive pulmonary disease) (HCC)     Priority: High   Diabetes mellitus without complication (HCC) 08/22/2013    Priority: High   Hypothyroidism 07/20/2009    Priority: Medium    Hyperlipidemia associated with type 2 diabetes mellitus (HCC) 07/20/2009    Priority: Medium    Testosterone  deficiency 08/22/2013    Priority: Low   GERD 01/21/2010    Priority: Low   Aortic atherosclerosis (HCC) 07/31/2020    Medications- reviewed and updated Current Outpatient Medications  Medication Sig Dispense Refill   aspirin EC 81 MG tablet Take 81 mg by mouth daily.     atorvastatin  (LIPITOR) 40 MG tablet TAKE 1 TABLET BY MOUTH DAILY 100 tablet 2   levothyroxine  (SYNTHROID ) 150 MCG tablet TAKE 1 TABLET BY MOUTH DAILY 100 tablet 2   metFORMIN  (GLUCOPHAGE ) 1000 MG tablet TAKE 1 TABLET BY MOUTH TWICE  DAILY 200 tablet 2   Multiple Vitamins-Minerals (MENS 50+ MULTI VITAMIN/MIN PO) Take 1 tablet by mouth daily.     pantoprazole  (PROTONIX ) 20 MG tablet TAKE 2 TABLETS BY MOUTH DAILY 200 tablet 2   sildenafil  (VIAGRA ) 100 MG tablet Take 1 tablet (100 mg total) by mouth as needed for erectile dysfunction. 30 tablet 3   metoprolol  tartrate (LOPRESSOR ) 25 MG tablet Take 1 tablet (25 mg total) by mouth daily as needed (rapid heart rate). (Patient not taking: Reported on 10/10/2022) 30 tablet 1   No current facility-administered medications for this visit.     Objective:  BP 110/72   Pulse 66   Temp 97.8 F (36.6 C)   Ht 5' 11 (1.803 m)   Wt 199 lb (90.3 kg)   SpO2 97%   BMI 27.75 kg/m   Gen: NAD, resting comfortably CV: RRR no murmurs rubs or gallops Lungs: CTAB no crackles, wheeze, rhonchi Ext: no edema Skin: warm, dry  Diabetic foot exam was performed with the following findings:   No deformities, ulcerations, or other skin breakdown Normal sensation of 10g monofilament Intact posterior tibialis and dorsalis pedis pulses Slightly weaker DP pulse compared to PT pulse        Assessment and Plan   #social update- back from [place]  seeing son out there.   #mechanical fall- tripped over equipment- feels fine thankfully with no ongoing injuries.   # Diabetes S: Medication: metformin  1g twice daily  -jardiance  worsened urination -sugars have gotten as high as 150 but recently at 120 this morning and 115 last Thursday- came back down Lab Results  Component Value Date   HGBA1C 6.6 (H) 04/10/2023   HGBA1C 6.6 (H) 05/30/2022   HGBA1C 6.6 (H) 02/03/2022  A/P: hopefully stable- update a1c today. Continue current meds for now   #hyperlipidemia #aortic atherosclerosis, 3 vessell coronary atherosclerosis per lung cancer screening 07/13/22 S: Medication: atorvastatin  40 mg daily, aspirin 81 mg -wife had elevated CT calcium  scoring over 700- they are eating healthier as a result Lab Results  Component Value Date   CHOL 107 10/10/2022   HDL 37.50 (L) 10/10/2022   LDLCALC 48 10/10/2022  LDLDIRECT 98.0 05/25/2020   TRIG 109.0 10/10/2022   CHOLHDL 3 10/10/2022  A/P: lipids at goal last year- continue current medications Aortic atherosclerosis (presumed stable)- LDL goal ideally <70 - continue current medications as at goal as long as stable- update today  #Emphysema-incidental finding on lung cancer screening 07/13/22.   -Previously quit smoking in 2014 but restarted after losing sister- before quitting again 2025- now 7 months free! Congratulated efforts  # SVT - presented to Emergency Department 09/27/22 S:medication: metoprolol  25 mg as needed for rapid heart rate   . Has not needed lately A/P: thankfully no recurrence- continue to monitor   #hypothyroidism S: compliant On thyroid  medication- levothyroxine  150 mcg   Lab Results  Component Value Date   TSH 1.89 04/10/2023  A/P:hopefully stable- update tsh today. Continue current meds for now    # GERD S:Medication:  pantoprazole  40 mg--> 20 mg twice daily A/P: reasonable control - continue current medications    #erectile dysfunction (ED) - sildenafil  helpful - refilled in may  #MUSCULOSKELETAL- has seen Dr. Leonce - history of right low back pain with known degenerative disk disease  -mr lumbar spine 08/23/22 - in past on valium and davocet but thinking unclear on these meds  -tizanidine  not very helpful - doing reasonably well but has to be cautious -considering more stretching/core   #AAA not formally ruled out 10/13/2022 due to bowel gas-repeat 3 years in 2027   Recommended follow up: Return in about 6 months (around 05/19/2024) for physical or sooner if needed.Schedule b4 you leave. Future Appointments  Date Time Provider Department Center  05/16/2024  8:20 AM Katrinka Garnette KIDD, MD [facility]   Lab/Order associations: FASTING   ICD-10-CM   1. Diabetes mellitus without complication (HCC)  E11.9     2. Hyperlipidemia associated with type 2 diabetes mellitus (HCC)  E11.69    E78.5     3. Acquired hypothyroidism  E03.9       No orders of the defined types were placed in this encounter.   Return precautions advised.  Garnette Katrinka, MD

## 2023-12-10 DIAGNOSIS — S0502XA Injury of conjunctiva and corneal abrasion without foreign body, left eye, initial encounter: Secondary | ICD-10-CM | POA: Diagnosis not present

## 2024-01-09 ENCOUNTER — Other Ambulatory Visit: Payer: Self-pay | Admitting: Family Medicine

## 2024-01-20 ENCOUNTER — Ambulatory Visit: Admitting: Physician Assistant

## 2024-01-20 ENCOUNTER — Other Ambulatory Visit (HOSPITAL_BASED_OUTPATIENT_CLINIC_OR_DEPARTMENT_OTHER): Payer: Self-pay

## 2024-01-20 ENCOUNTER — Encounter: Payer: Self-pay | Admitting: Physician Assistant

## 2024-01-20 ENCOUNTER — Ambulatory Visit: Payer: Self-pay | Admitting: Physician Assistant

## 2024-01-20 VITALS — BP 132/80 | HR 75 | Temp 98.1°F | Ht 71.0 in | Wt 204.2 lb

## 2024-01-20 DIAGNOSIS — E119 Type 2 diabetes mellitus without complications: Secondary | ICD-10-CM

## 2024-01-20 DIAGNOSIS — R030 Elevated blood-pressure reading, without diagnosis of hypertension: Secondary | ICD-10-CM

## 2024-01-20 DIAGNOSIS — I2510 Atherosclerotic heart disease of native coronary artery without angina pectoris: Secondary | ICD-10-CM

## 2024-01-20 MED ORDER — OMRON 3 SERIES BP MONITOR DEVI
1.0000 | Freq: Once | 0 refills | Status: AC
Start: 2024-01-20 — End: 2024-01-21
  Filled 2024-01-20: qty 1, 1d supply, fill #0

## 2024-01-20 NOTE — Patient Instructions (Addendum)
 It was great to see you!  I have sent in blood pressure monitor to [facility] --- please go there to get that and start monitoring your blood pressure.  We will check an EKG today. I will review with Hunter. Regardless, I think seeing cardiology is a good idea - please call to schedule and let us  know if you need anything from us  for this.  Your goal blood pressure should be around < 135/85, unless you are over 59 years old, your goal may be closer to 140-150/90. Please note if you have been given other goals from a cardiologist or other healthcare provider, please defer to their recommendations.  When preparing to take your blood pressure: Plan ahead. Don't smoke, drink caffeine or exercise within 30 minutes before taking your blood pressure. Empty your bladder. Don't take the measurement over clothes. Remove the clothing over the arm that will be used to measure blood pressure. You can use either arm unless otherwise told by a healthcare provider. Usually there is not a big difference between readings on them. Be still. Allow at least five minutes of quiet rest before measurements. Don't talk or use the phone. Sit correctly. Sit with your back straight and supported (on a dining chair, rather than a sofa). Your feet should be flat on the floor. Do not cross your legs. Support your arm on a flat surface. The middle of the cuff should be placed on the upper arm at heart level.  Measure at the same time of the day. Take multiple readings and record the results. Each time you measure, take two readings one minute apart. Record the results and bring in to your next office visit.  In order to know how well the medication is working, I would like you to take your readings 1-2 hours after taking your blood pressure medication if possible. Take your blood pressure measurements and record 2-3 days per week.  If you get a high blood pressure reading: A single high reading is not an immediate  cause for alarm. If you get a reading that is higher than normal, take your blood pressure a second time. Write down the results of both measurements. Check with your health care professional to see if there's a health concern or whether there may be problems with your monitor. If your blood pressure readings are suddenly higher than 180/120 mm Hg, wait at least one minute and test again. If your readings are still very high, contact your health care professional immediately. You could be having a hypertensive crisis. Call 911 if your blood pressure is higher than 180/120 mm Hg and if you are having new signs or symptoms that may include: Chest pain Shortness of breath Back pain Numbness Weakness Change in vision Difficulty speaking Confusion Dizziness Vomiting  Take care,  [name] PA-C

## 2024-01-20 NOTE — Progress Notes (Signed)
 Tracy Turner is a 68 y.o. male here for a follow up of a pre-existing problem.  History of Present Illness:   Chief Complaint  Patient presents with   c/o elevated blood pressure    Pt c/o blood pressure elevated past few weeks, 147/87 yesterday, some headaches. Also blood sugars have 131-145.   Discussed the use of AI scribe software for clinical note transcription with the patient, who gave verbal consent to proceed.  History of Present Illness Tracy Turner is a 68 year old male with hypertension and diabetes who presents with elevated blood pressure readings.  Since August 7th, he has had elevated blood pressure readings, initially noted at an urgent care visit with a reading of 160/?. His usual blood pressure is around 120/70. Subsequent readings were 140/? and 147/87. He does not own a blood pressure monitor.  He manages diabetes with metformin , and his blood sugar has fluctuated between 131 and 160 since August 31st. His hemoglobin A1c levels have been increasing. He previously tried Jardiance  without significant improvement. He is considering addition of Ozempic if next Hemoglobin A1c is not improved.  He experiences 'funny feelings' and occasional shortness of breath, especially when sitting. He has a family history of heart issues, with a half-brother recently having stents placed. He has a history of SVT and was previously evaluated by a cardiologist.  He feels easily dehydrated despite adequate fluid intake and experiences frequent urination, which he attributes to metformin  use. His stools are often soft. He quit smoking and has experienced weight fluctuations. He is compliant with levothyroxine  for thyroid  management, which is well-controlled.    Past Medical History:  Diagnosis Date   COPD (chronic obstructive pulmonary disease) (HCC)    mild   Diabetes mellitus without complication (HCC)    more controlled A1C after 30 # weight lost   GERD (gastroesophageal reflux  disease)    History of colonic polyps    Hyperlipidemia    Hypothyroidism    NONGONOCOCCAL URETHRITIS 10/12/2009   Qualifier: Diagnosis of  By: Jame  MD, Maude FALCON    Tobacco abuse      Social History   Tobacco Use   Smoking status: Former    Current packs/day: 0.00    Average packs/day: 0.6 packs/day for 37.6 years (21.5 ttl pk-yrs)    Types: Cigarettes    Start date: 08/30/1977    Quit date: 04/16/2023    Years since quitting: 0.7   Smokeless tobacco: Never   Tobacco comments:    using Chantix .SABRA..40 years on and off; 05-22-14 now smoking again < 1/2 ppd  Substance Use Topics   Alcohol use: Not Currently    Comment: occ. maybe every month    Drug use: No    Past Surgical History:  Procedure Laterality Date   COLONOSCOPY  2008   ETT  2007   HERNIA REPAIR     INGUINAL HERNIA REPAIR Bilateral 05/25/2014   Procedure: BILATERAL LAPAROSCOPIC INGUINAL HERNIA WITH MESH;  Surgeon: Elspeth Schultze, MD;  Location: [facility];  Service: General;  Laterality: Bilateral;   INSERTION OF MESH Bilateral 05/25/2014   Procedure: INSERTION OF MESH;  Surgeon: Elspeth Schultze, MD;  Location: [facility];  Service: General;  Laterality: Bilateral;   TOOTH EXTRACTION     several -molars    Family History  Problem Relation Age of Onset   Heart disease Mother    Dementia Mother        on hospice early 70-died from COVID-19 in rehab-got sick from  nurse   Heart disease Father        died at 57   Stroke Father    Hyperlipidemia Father    Obesity Sister        unknown cause of death- possible cardiac   Drug abuse Sister    Hypertension Sister    Hypertension Brother    Colon cancer Neg Hx     No Known Allergies  Current Medications:   Current Outpatient Medications:    aspirin EC 81 MG tablet, Take 81 mg by mouth daily., Disp: , Rfl:    atorvastatin  (LIPITOR) 40 MG tablet, TAKE 1 TABLET BY MOUTH DAILY, Disp: 100 tablet, Rfl: 2   Blood Pressure Monitoring (OMRON 3 SERIES BP MONITOR) DEVI, Use  as directed to check blood pressure., Disp: 1 each, Rfl: 0   levothyroxine  (SYNTHROID ) 150 MCG tablet, TAKE 1 TABLET BY MOUTH DAILY, Disp: 100 tablet, Rfl: 2   metFORMIN  (GLUCOPHAGE ) 1000 MG tablet, TAKE 1 TABLET BY MOUTH TWICE  DAILY, Disp: 200 tablet, Rfl: 2   Multiple Vitamins-Minerals (MENS 50+ MULTI VITAMIN/MIN PO), Take 1 tablet by mouth daily., Disp: , Rfl:    pantoprazole  (PROTONIX ) 20 MG tablet, TAKE 2 TABLETS BY MOUTH DAILY, Disp: 200 tablet, Rfl: 2   sildenafil  (VIAGRA ) 100 MG tablet, Take 1 tablet (100 mg total) by mouth as needed for erectile dysfunction., Disp: 30 tablet, Rfl: 3   Vitamin D, Cholecalciferol, 25 MCG (1000 UT) CAPS, Take 1 capsule by mouth daily in the afternoon., Disp: , Rfl:    Review of Systems:   Negative unless otherwise specified per HPI.  Vitals:   Vitals:   01/20/24 0940  BP: 132/80  Pulse: 75  Temp: 98.1 F (36.7 C)  TempSrc: Temporal  SpO2: 99%  Weight: 204 lb 4 oz (92.6 kg)  Height: 5' 11 (1.803 m)     Body mass index is 28.49 kg/m.  Physical Exam:   Physical Exam Vitals and nursing note reviewed.  Constitutional:      General: He is not in acute distress.    Appearance: He is well-developed. He is not ill-appearing or toxic-appearing.  Cardiovascular:     Rate and Rhythm: Normal rate and regular rhythm.     Pulses: Normal pulses.     Heart sounds: Normal heart sounds, S1 normal and S2 normal.  Pulmonary:     Effort: Pulmonary effort is normal.     Breath sounds: Normal breath sounds.  Skin:    General: Skin is warm and dry.  Neurological:     Mental Status: He is alert.     GCS: GCS eye subscore is 4. GCS verbal subscore is 5. GCS motor subscore is 6.  Psychiatric:        Speech: Speech normal.        Behavior: Behavior normal. Behavior is cooperative.     Assessment and Plan:   Assessment and Plan Assessment & Plan Elevated blood pressure reading Blood pressure elevated with recent readings.  - Send prescription for  blood pressure monitor to [facility] pharmacy. - Provide blood pressure log for documentation as well as instructions - BP today does not warrant prescription  - Review blood pressure readings with Dr Katrinka - No exertional chest pain, shortness of breath or leg swelling, however he was advised to go to the ER if these develop. -- EKG tracing is personally reviewed.  EKG notes NSR.  Reviewed with PCP -- change in QRS complex of V3.  Type 2  diabetes mellitus Blood sugars fluctuating. Hemoglobin A1c increasing. Metformin  well-tolerated. Jardiance  ineffective. Potential need for additional medication. - Continue metformin . - Reassess hemoglobin A1c in October with Dr. Katrinka.  Coronary artery atherosclerosis Coronary artery plaque noted on CT chest in March 2025. Family history of heart issues. Previous normal cardiology evaluation post-SVT. Further evaluation considered due to family history and symptoms. - Recommend cardiology consultation with Doctor Croituri for further evaluation of coronary artery disease, fluctuating blood pressures and overall cardiac risk. - Continue Lipitor 40 mg and aspirin daily   I personally spent a total of 47 minutes in the care of the patient today including preparing to see the patient, getting/reviewing separately obtained history, performing a medically appropriate exam/evaluation, counseling and educating, referring and communicating with other health care professionals, and documenting clinical information in the EHR.    [name], PA-C

## 2024-02-21 ENCOUNTER — Other Ambulatory Visit: Payer: Self-pay | Admitting: Family Medicine

## 2024-02-23 ENCOUNTER — Encounter: Payer: Self-pay | Admitting: Family Medicine

## 2024-02-23 ENCOUNTER — Ambulatory Visit: Payer: Self-pay | Admitting: Family Medicine

## 2024-02-23 ENCOUNTER — Ambulatory Visit: Admitting: Family Medicine

## 2024-02-23 VITALS — BP 148/82 | HR 66 | Temp 98.2°F | Ht 71.0 in | Wt 205.4 lb

## 2024-02-23 DIAGNOSIS — E119 Type 2 diabetes mellitus without complications: Secondary | ICD-10-CM

## 2024-02-23 DIAGNOSIS — E785 Hyperlipidemia, unspecified: Secondary | ICD-10-CM

## 2024-02-23 DIAGNOSIS — E1169 Type 2 diabetes mellitus with other specified complication: Secondary | ICD-10-CM | POA: Diagnosis not present

## 2024-02-23 DIAGNOSIS — Z7984 Long term (current) use of oral hypoglycemic drugs: Secondary | ICD-10-CM

## 2024-02-23 DIAGNOSIS — I2510 Atherosclerotic heart disease of native coronary artery without angina pectoris: Secondary | ICD-10-CM | POA: Diagnosis not present

## 2024-02-23 DIAGNOSIS — E039 Hypothyroidism, unspecified: Secondary | ICD-10-CM

## 2024-02-23 LAB — URINALYSIS, ROUTINE W REFLEX MICROSCOPIC
Bilirubin Urine: NEGATIVE
Hgb urine dipstick: NEGATIVE
Ketones, ur: NEGATIVE
Leukocytes,Ua: NEGATIVE
Nitrite: NEGATIVE
RBC / HPF: NONE SEEN (ref 0–?)
Specific Gravity, Urine: 1.01 (ref 1.000–1.030)
Total Protein, Urine: NEGATIVE
Urine Glucose: NEGATIVE
Urobilinogen, UA: 0.2 (ref 0.0–1.0)
pH: 6 (ref 5.0–8.0)

## 2024-02-23 LAB — COMPREHENSIVE METABOLIC PANEL WITH GFR
ALT: 24 U/L (ref 0–53)
AST: 14 U/L (ref 0–37)
Albumin: 4.7 g/dL (ref 3.5–5.2)
Alkaline Phosphatase: 50 U/L (ref 39–117)
BUN: 15 mg/dL (ref 6–23)
CO2: 28 meq/L (ref 19–32)
Calcium: 9.8 mg/dL (ref 8.4–10.5)
Chloride: 104 meq/L (ref 96–112)
Creatinine, Ser: 1.04 mg/dL (ref 0.40–1.50)
GFR: 74.16 mL/min (ref >60.00)
Glucose, Bld: 127 mg/dL — ABNORMAL HIGH (ref 70–99)
Potassium: 4.5 meq/L (ref 3.5–5.1)
Sodium: 142 meq/L (ref 135–145)
Total Bilirubin: 0.7 mg/dL (ref 0.2–1.2)
Total Protein: 6.9 g/dL (ref 6.0–8.3)

## 2024-02-23 LAB — MICROALBUMIN / CREATININE URINE RATIO
Creatinine,U: 50.5 mg/dL
Microalb Creat Ratio: UNDETERMINED mg/g (ref 0.0–30.0)
Microalb, Ur: <0.7 mg/dL

## 2024-02-23 LAB — TSH: TSH: 1.81 u[IU]/mL (ref 0.35–5.50)

## 2024-02-23 LAB — HEMOGLOBIN A1C: Hgb A1c MFr Bld: 7.2 % — ABNORMAL HIGH (ref 4.6–6.5)

## 2024-02-23 MED ORDER — EMPAGLIFLOZIN 10 MG PO TABS: 10.0000 mg | ORAL_TABLET | Freq: Every day | ORAL | 3 refills | Status: AC

## 2024-02-23 NOTE — Patient Instructions (Addendum)
 blood pressure has gone up since last visit with unclear trigger. Weight is up 6 lbs and we opted to push for dash eating plan and update me in 1 month. He is already avoiding added salt but may need to look at salt content in food. Exercise also helps. Another option would be coenzyme q10 for heart health which may also slightly lower blood pressure.   We have placed a referral for you today to cardiology- please call their # if you do not hear within a week (may be listed below or you may see mychart message within a few days with #).   Please stop by lab before you go If you have mychart- we will send your results within 3 business days of us  receiving them.  If you do not have mychart- we will call you about results within 5 business days of us  receiving them.  *please also note that you will see labs on mychart as soon as they post. I will later go in and write notes on them- will say notes from Dr. Katrinka   Recommended follow up: Return for next already scheduled visit or sooner if needed.

## 2024-02-23 NOTE — Progress Notes (Signed)
 Phone [phone] In person visit   Subjective:   Tracy Turner is a 68 y.o. year old very pleasant male patient who presents for/with See problem oriented charting Chief Complaint  Patient presents with   Diabetes    Pt declines all vaccines; pt is fasting;     Past Medical History-  Patient Active Problem List   Diagnosis Date Noted   CAD (coronary artery disease) 02/23/2024    Priority: High   COPD (chronic obstructive pulmonary disease) (HCC)     Priority: High   Diabetes mellitus without complication (HCC) 08/22/2013    Priority: High   Hypothyroidism 07/20/2009    Priority: Medium    Hyperlipidemia associated with type 2 diabetes mellitus (HCC) 07/20/2009    Priority: Medium    Testosterone  deficiency 08/22/2013    Priority: Low   GERD 01/21/2010    Priority: Low   Aortic atherosclerosis 07/31/2020    Medications- reviewed and updated Current Outpatient Medications  Medication Sig Dispense Refill   aspirin EC 81 MG tablet Take 81 mg by mouth daily.     atorvastatin  (LIPITOR) 40 MG tablet TAKE 1 TABLET BY MOUTH DAILY 100 tablet 2   levothyroxine  (SYNTHROID ) 150 MCG tablet TAKE 1 TABLET BY MOUTH DAILY 100 tablet 2   metFORMIN  (GLUCOPHAGE ) 1000 MG tablet TAKE 1 TABLET BY MOUTH TWICE  DAILY 200 tablet 2   Multiple Vitamins-Minerals (MENS 50+ MULTI VITAMIN/MIN PO) Take 1 tablet by mouth daily.     pantoprazole  (PROTONIX ) 20 MG tablet TAKE 2 TABLETS BY MOUTH DAILY 200 tablet 2   sildenafil  (VIAGRA ) 100 MG tablet Take 1 tablet (100 mg total) by mouth as needed for erectile dysfunction. 30 tablet 3   Vitamin D, Cholecalciferol, 25 MCG (1000 UT) CAPS Take 1 capsule by mouth daily in the afternoon.     No current facility-administered medications for this visit.     Objective:  BP (!) 148/82 (BP Location: Left Arm, Patient Position: Sitting, Cuff Size: Normal)   Pulse 66   Temp 98.2 F (36.8 C) (Temporal)   Ht 5' 11 (1.803 m)   Wt 205 lb 6.4 oz (93.2 kg)   SpO2  95%   BMI 28.65 kg/m  Gen: NAD, resting comfortably CV: RRR no murmurs rubs or gallops Lungs: CTAB no crackles, wheeze, rhonchi Abdomen: soft/nontender/nondistended/normal bowel sounds. No rebound or guarding.  Ext: no edema Skin: warm, dry     Assessment and Plan   #elevated blood pressure reading S: medication: none Home readings #s: some #s in the 120s but many #s in 130s, 140's and even 150's systolic with diastolic controlled. #s drpo after activity such as 123/75 yesterday BP Readings from Last 3 Encounters:  02/23/24 (!) 148/82  01/20/24 132/80  11/17/23 110/72  A/P: blood pressure has gone up since last visit with unclear trigger. Weight is up 6 lbs and we opted to push for dash eating plan and update me in 1 month. He is already avoiding added salt but may need to look at salt content in food. Exercise also helps. Another option would be coenzyme q10 for heart health which may also slightly lower blood pressure.   # Diabetes S: Medication: metformin  1g twice daily - has been consistent -jardiance  worsened urination CBGs- sugars previously around 120 but seeing a lot more 130s and 140's and some 150's. Reports there is no dietary change Exercise and diet- weight up 6 lbs- does not feel changed diet much- will check TSH.  Lab Results  Component  Value Date   HGBA1C 7.3 (H) 11/17/2023   HGBA1C 6.6 (H) 04/10/2023   HGBA1C 6.6 (H) 05/30/2022  A/P: diabetes sounds like may be worse- update today. Possibly interested in jardiance  if under 8 along with weight loss- wants to hold on injections for now - would want to use mail order likely jardiance   #CAD #hyperlipidemia #aortic atherosclerosis, 3 vessell coronary atherosclerosis per lung cancer screening 07/13/22 S: Medication: atorvastatin  40 mg daily, aspirin 81 mg daily -Last visit he reported improved diet due to wife having elevated CT calcium  scoring of 700 Lab Results  Component Value Date   CHOL 137 11/17/2023   HDL  40.20 11/17/2023   LDLCALC 70 11/17/2023   LDLDIRECT 98.0 05/25/2020   TRIG 134.0 11/17/2023   CHOLHDL 3 11/17/2023  A/P: patient would like to get baseline exam with Dr. Francyne- he's wife sees him. Thankfully no chest pain or shortness of breath and lipids at goal- continue current medications for now  - also still cigarette free 10 months this month!  -nocturia 3-4 x a night but no worsening - has been like this for a while  #Emphysema-incidental finding on lung cancer screening 07/13/22.   -Previously quit smoking in 2014 but restarted after losing sister  before quitting again 2025- now 10 months free  # SVT - presented to Emergency Department 09/27/22 S:medication: metoprolol  25 mg as needed for rapid heart rate  . Vague off sensation in chest without pain A/P: no recent issues- continue to monitor     #hypothyroidism S: compliant On thyroid  medication- levothyroxine  150 mcg  Lab Results  Component Value Date   TSH 2.64 11/17/2023  A/P: hopefully stable- update tsh today. Continue current meds for now     Recommended follow up: Return for next already scheduled visit or sooner if needed. Future Appointments  Date Time Provider Department Center  05/16/2024  8:20 AM Katrinka Garnette KIDD, MD [facility]    Lab/Order associations:   ICD-10-CM   1. Diabetes mellitus without complication (HCC)  E11.9 Comprehensive metabolic panel with GFR    Hemoglobin A1c    Urinalysis, Routine w reflex microscopic    Microalbumin / creatinine urine ratio    2. Acquired hypothyroidism  E03.9 TSH    3. Hyperlipidemia associated with type 2 diabetes mellitus (HCC)  E11.69    E78.5     4. Coronary artery disease involving native coronary artery of native heart without angina pectoris  I25.10 Ambulatory referral to Cardiology      No orders of the defined types were placed in this encounter.   Return precautions advised.  Garnette Katrinka, MD

## 2024-03-03 ENCOUNTER — Encounter: Payer: Self-pay | Admitting: Family Medicine

## 2024-04-06 ENCOUNTER — Encounter: Payer: Self-pay | Admitting: Family Medicine

## 2024-04-08 ENCOUNTER — Ambulatory Visit: Attending: Cardiovascular Disease | Admitting: Cardiovascular Disease

## 2024-04-08 ENCOUNTER — Encounter: Payer: Self-pay | Admitting: Cardiovascular Disease

## 2024-04-08 VITALS — BP 122/66 | HR 66 | Ht 71.5 in | Wt 202.8 lb

## 2024-04-08 DIAGNOSIS — E785 Hyperlipidemia, unspecified: Secondary | ICD-10-CM

## 2024-04-08 DIAGNOSIS — I10 Essential (primary) hypertension: Secondary | ICD-10-CM

## 2024-04-08 DIAGNOSIS — R079 Chest pain, unspecified: Secondary | ICD-10-CM

## 2024-04-08 DIAGNOSIS — I2510 Atherosclerotic heart disease of native coronary artery without angina pectoris: Secondary | ICD-10-CM | POA: Diagnosis not present

## 2024-04-08 DIAGNOSIS — R072 Precordial pain: Secondary | ICD-10-CM | POA: Diagnosis not present

## 2024-04-08 DIAGNOSIS — E669 Obesity, unspecified: Secondary | ICD-10-CM

## 2024-04-08 DIAGNOSIS — E1169 Type 2 diabetes mellitus with other specified complication: Secondary | ICD-10-CM

## 2024-04-08 DIAGNOSIS — E119 Type 2 diabetes mellitus without complications: Secondary | ICD-10-CM

## 2024-04-08 DIAGNOSIS — I471 Supraventricular tachycardia, unspecified: Secondary | ICD-10-CM

## 2024-04-08 LAB — BASIC METABOLIC PANEL WITH GFR
BUN/Creatinine Ratio: 14 (ref 10–24)
BUN: 17 mg/dL (ref 8–27)
CO2: 23 mmol/L (ref 20–29)
Calcium: 9.9 mg/dL (ref 8.6–10.2)
Chloride: 101 mmol/L (ref 96–106)
Creatinine, Ser: 1.23 mg/dL (ref 0.76–1.27)
Glucose: 116 mg/dL — ABNORMAL HIGH (ref 70–99)
Potassium: 4.4 mmol/L (ref 3.5–5.2)
Sodium: 139 mmol/L (ref 134–144)
eGFR: 64 mL/min/1.73 (ref >59)

## 2024-04-08 MED ORDER — METOPROLOL TARTRATE 50 MG PO TABS: 50.0000 mg | ORAL_TABLET | Freq: Once | ORAL | 0 refills | Status: DC

## 2024-04-08 NOTE — Patient Instructions (Signed)
 Medication Instructions:  No changes *If you need a refill on your cardiac medications before your next appointment, please call your pharmacy*  Lab Work: BMP If you have labs (blood work) drawn today and your tests are completely normal, you will receive your results only by: MyChart Message (if you have MyChart) OR A paper copy in the mail If you have any lab test that is abnormal or we need to change your treatment, we will call you to review the results.  Testing/Procedures:   Your cardiac CT will be scheduled at:   [facility] [address]  [place], [place] [address]  If scheduled at the [facility] at [address], please enter the parking lot using the [address] entrance and use the FREE valet service at the patient drop-off area. Enter the building and check-in with registration on the main floor.  Please follow these instructions carefully (unless otherwise directed):  An IV will be required for this test and Nitroglycerin will be given.  Hold all erectile dysfunction medications at least 3 days (72 hrs) prior to test. (Ie viagra , cialis, sildenafil , tadalafil, etc)   On the Night Before the Test: Be sure to Drink plenty of water . Do not consume any caffeinated/decaffeinated beverages or chocolate 12 hours prior to your test. Do not take any antihistamines 12 hours prior to your test.  On the Day of the Test: Drink plenty of water  until 1 hour prior to the test. Do not eat any food 1 hour prior to test. You may take your regular medications prior to the test.  Take metoprolol  (Lopressor ) 50 mg, two hours prior to test. If you take Furosemide/Hydrochlorothiazide/Spironolactone/Chlorthalidone, please HOLD on the morning of the test. Patients who wear a continuous glucose monitor MUST remove the device prior to scanning.    After the Test: Drink plenty of water . After receiving IV contrast, you may experience a mild  flushed feeling. This is normal. On occasion, you may experience a mild rash up to 24 hours after the test. This is not dangerous. If this occurs, you can take Benadryl 25 mg, Zyrtec, Claritin, or Allegra and increase your fluid intake. (Patients taking Tikosyn should avoid Benadryl, and may take Zyrtec, Claritin, or Allegra) If you experience trouble breathing, this can be serious. If it is severe call 911 IMMEDIATELY. If it is mild, please call our office.  We will call to schedule your test 2-4 weeks out understanding that some insurance companies will need an authorization prior to the service being performed.   For more information and frequently asked questions, please visit our website : [email or web address]  For non-scheduling related questions, please contact the cardiac imaging nurse navigator should you have any questions/concerns: Cardiac Imaging Nurse Navigators Direct Office Dial: [phone]   For scheduling needs, including cancellations and rescheduling, please call Brittany, [phone].   Follow-Up: At [facility], you and your health needs are our priority.  As part of our continuing mission to provide you with exceptional heart care, our providers are all part of one team.  This team includes your primary Cardiologist (physician) and Advanced Practice Providers or APPs (Physician Assistants and Nurse Practitioners) who all work together to provide you with the care you need, when you need it.  Your next appointment:    Follow up as needed  Provider:   Dr Francyne  We recommend signing up for the patient portal called MyChart.  Sign up information is provided on this  After Visit Summary.  MyChart is used to connect with patients for Virtual Visits (Telemedicine).  Patients are able to view lab/test results, encounter notes, upcoming appointments, etc.  Non-urgent messages can be sent to your provider as well.   To learn more about what you can do  with MyChart, go to [email or web address].

## 2024-04-08 NOTE — Progress Notes (Signed)
 Cardiology Office Note   Date:  04/10/2024  ID:  Tracy Turner, DOB 12-22-1955, MRN [id number] PCP: Katrinka Garnette KIDD, MD  [facility] Providers Cardiologist:  Jerel Balding, MD    Tracy Turner is a 68 y.o. male who is being seen today for the evaluation of coronary calcifications and chest pain at the request of Katrinka Garnette KIDD, MD.  History of Present Illness Tracy Turner is a 69 y.o. male with hypertension and coronary artery disease who presents with concerns about blood pressure fluctuations and chest discomfort.  His wife Tracy Turner is also my patient.  He has been experiencing fluctuations in blood pressure over the past month or two, with higher readings in the morning and lower in the afternoon. A notable high reading of 160 mmHg occurred after an incident involving his eye, which was unusual for him.  He describes a sensation of chest discomfort initially thought to be related to gas, associated with consuming cheese and wheat crackers. After eliminating these foods from his diet in the last three to four days, the discomfort has subsided. 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  He has a history of supraventricular tachycardia (SVT) from about a year ago, where his heart rate reached 180 beats per minute without chest pain, only a feeling of being 'off'. The episode resolved with adenosine administered by emergency medical services, and he has not experienced further episodes since.  His family history includes coronary artery disease, with his brother having had two stents placed recently. He is working on lifestyle modifications such as quitting smoking and managing his blood sugar levels.  No chest pain during the SVT episode, but he reported feeling 'off'. Discomfort that felt like gas has improved after dietary changes.    Studies Reviewed     CT chest (lung cancer screening)  07/13/2023: Three-vessel coronary atherosclerosis, aortic atherosclerosis, emphysema Ultrasound of the abdominal aorta for AAA screening 10/13/2022: Maximum aortic diameter 3.3 cm   Risk Assessment/Calculations        Physical Exam VS:  BP 122/66 (BP Location: Left Arm, Patient Position: Sitting, Cuff Size: Normal)   Pulse 66   Ht 5' 11.5 (1.816 m)   Wt 202 lb 12.8 oz (92 kg)   SpO2 97%   BMI 27.89 kg/m        Wt Readings from Last 3 Encounters:  04/08/24 202 lb 12.8 oz (92 kg)  02/23/24 205 lb 6.4 oz (93.2 kg)  01/20/24 204 lb 4 oz (92.6 kg)    GEN: Well nourished, well developed in no acute distress NECK: No JVD; No carotid bruits CARDIAC: RRR, no murmurs, rubs, gallops RESPIRATORY:  Clear to auscultation without rales, wheezing or rhonchi  ABDOMEN: Soft, non-tender, non-distended EXTREMITIES:  No edema; No deformity   ASSESSMENT AND PLAN Coronary artery disease: calcification in all three major coronary arteries. No current symptoms of coronary ischemia. Previous SVT episode with heart rate of 180 bpm, resolved spontaneously. Differential includes indigestion as a potential cardiac symptom. Discussed the natural circadian rhythm of blood pressure and the importance of maintaining a baseline blood pressure of less than 130/80 mmHg. Emphasized the importance of lifestyle modifications to prevent progression of coronary artery disease. Discussed the potential for indigestion to be a cardiac symptom and the importance of seeking emergency care if symptoms persist beyond 30 minutes. Ordered coronary CT angiogram to assess for coronary artery blockages. Hypertension: Blood pressure readings show variability, with higher  readings in the morning and lower in the afternoon. Recent readings have been well-controlled. Discussed the impact of exercise on blood pressure and the importance of lifestyle modifications such as reducing salt intake and weight loss.  Continue current  antihypertensive regimen.  Encouraged lifestyle modifications including reduced salt intake and weight loss. Type 2 diabetes mellitus: focus on maintaining A1c below 7%. Current management includes lifestyle modifications and medication. Discussed the importance of weight loss in improving glycemic control. Paroxysmal supraventricular tachycardia (SVT): A single previous episode of SVT with heart rate of 180 bpm, resolved with IV adenosine.  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  No recurrence of symptoms. Discussed non-pharmacological methods to manage SVT, including the Valsalva maneuver and diving reflex. Hyperlipidemia: LDL cholesterol is at target level of 70 mg/dL.  Continue current lipid-lowering therapy. Obesity:  Current weight is 195 lbs with a goal to reduce weight to between 185 and 190 lbs. Discussed the benefits of weight loss on blood pressure, diabetes control, and overall cardiovascular health.       Dispo: Coronary CT angiogram.  Follow-up in 1 year.  Signed, Jerel Balding, MD

## 2024-04-09 ENCOUNTER — Ambulatory Visit: Payer: Self-pay | Admitting: Cardiovascular Disease

## 2024-04-25 ENCOUNTER — Encounter (HOSPITAL_COMMUNITY): Payer: Self-pay

## 2024-04-29 ENCOUNTER — Other Ambulatory Visit: Payer: Self-pay | Admitting: Cardiovascular Disease

## 2024-04-29 ENCOUNTER — Ambulatory Visit (HOSPITAL_BASED_OUTPATIENT_CLINIC_OR_DEPARTMENT_OTHER)
Admission: RE | Admit: 2024-04-29 | Discharge: 2024-04-29 | Disposition: A | Discharge status: Home / Self Care | Source: Ambulatory Visit | Attending: Cardiovascular Disease | Admitting: Cardiovascular Disease

## 2024-04-29 ENCOUNTER — Ambulatory Visit (HOSPITAL_COMMUNITY)
Admission: RE | Admit: 2024-04-29 | Discharge: 2024-04-29 | Disposition: A | Discharge status: Home / Self Care | Source: Ambulatory Visit | Attending: Cardiology | Admitting: Cardiology

## 2024-04-29 DIAGNOSIS — R079 Chest pain, unspecified: Secondary | ICD-10-CM | POA: Diagnosis present

## 2024-04-29 DIAGNOSIS — R931 Abnormal findings on diagnostic imaging of heart and coronary circulation: Secondary | ICD-10-CM | POA: Diagnosis not present

## 2024-04-29 DIAGNOSIS — R072 Precordial pain: Secondary | ICD-10-CM | POA: Insufficient documentation

## 2024-04-29 DIAGNOSIS — I2510 Atherosclerotic heart disease of native coronary artery without angina pectoris: Secondary | ICD-10-CM | POA: Diagnosis not present

## 2024-04-29 MED ORDER — IOHEXOL 350 MG/ML SOLN
100.0000 mL | Freq: Once | INTRAVENOUS | Status: AC | PRN
  Administered 2024-04-29: 100 mL via INTRAVENOUS

## 2024-04-29 MED ORDER — NITROGLYCERIN 0.4 MG SL SUBL
0.8000 mg | SUBLINGUAL_TABLET | Freq: Once | SUBLINGUAL | Status: AC
  Administered 2024-04-29: 0.8 mg via SUBLINGUAL

## 2024-05-16 ENCOUNTER — Encounter: Payer: Self-pay | Admitting: Family Medicine

## 2024-05-16 ENCOUNTER — Ambulatory Visit: Admitting: Family Medicine

## 2024-05-16 VITALS — BP 122/70 | HR 69 | Temp 98.6°F | Ht 71.5 in | Wt 201.2 lb

## 2024-05-16 DIAGNOSIS — Z0000 Encounter for general adult medical examination without abnormal findings: Secondary | ICD-10-CM | POA: Diagnosis not present

## 2024-05-16 DIAGNOSIS — N528 Other male erectile dysfunction: Secondary | ICD-10-CM

## 2024-05-16 DIAGNOSIS — Z125 Encounter for screening for malignant neoplasm of prostate: Secondary | ICD-10-CM | POA: Diagnosis not present

## 2024-05-16 DIAGNOSIS — Z1283 Encounter for screening for malignant neoplasm of skin: Secondary | ICD-10-CM

## 2024-05-16 DIAGNOSIS — E1169 Type 2 diabetes mellitus with other specified complication: Secondary | ICD-10-CM | POA: Diagnosis not present

## 2024-05-16 DIAGNOSIS — K219 Gastro-esophageal reflux disease without esophagitis: Secondary | ICD-10-CM | POA: Diagnosis not present

## 2024-05-16 DIAGNOSIS — E119 Type 2 diabetes mellitus without complications: Secondary | ICD-10-CM

## 2024-05-16 DIAGNOSIS — Z7984 Long term (current) use of oral hypoglycemic drugs: Secondary | ICD-10-CM | POA: Diagnosis not present

## 2024-05-16 DIAGNOSIS — E039 Hypothyroidism, unspecified: Secondary | ICD-10-CM

## 2024-05-16 DIAGNOSIS — J449 Chronic obstructive pulmonary disease, unspecified: Secondary | ICD-10-CM | POA: Diagnosis not present

## 2024-05-16 NOTE — Progress Notes (Signed)
 " Phone: [phone]   Subjective:  Patient presents today for their annual physical. Chief complaint-noted.   See problem oriented charting- ROS- full  review of systems was completed and negative  except for topics noted under acute/chronic concerns  The following were reviewed and entered/updated in epic: Past Medical History:  Diagnosis Date   COPD (chronic obstructive pulmonary disease) (HCC)    mild   Diabetes mellitus without complication (HCC)    more controlled A1C after 30 # weight lost   GERD (gastroesophageal reflux disease)    History of colonic polyps    Hyperlipidemia    Hypothyroidism    NONGONOCOCCAL URETHRITIS 10/12/2009   Qualifier: Diagnosis of  By: Jame  MD, Maude FALCON    Tobacco abuse    Patient Active Problem List   Diagnosis Date Noted   CAD (coronary artery disease) 02/23/2024    Priority: High   COPD (chronic obstructive pulmonary disease) (HCC)     Priority: High   Diabetes mellitus without complication (HCC) 08/22/2013    Priority: High   Hypothyroidism 07/20/2009    Priority: Medium    Hyperlipidemia associated with type 2 diabetes mellitus (HCC) 07/20/2009    Priority: Medium    Testosterone  deficiency 08/22/2013    Priority: Low   GERD 01/21/2010    Priority: Low   Aortic atherosclerosis 07/31/2020   Past Surgical History:  Procedure Laterality Date   COLONOSCOPY  2008   ETT  2007   HERNIA REPAIR     INGUINAL HERNIA REPAIR Bilateral 05/25/2014   Procedure: BILATERAL LAPAROSCOPIC INGUINAL HERNIA WITH MESH;  Surgeon: Elspeth Schultze, MD;  Location: [facility];  Service: General;  Laterality: Bilateral;   INSERTION OF MESH Bilateral 05/25/2014   Procedure: INSERTION OF MESH;  Surgeon: Elspeth Schultze, MD;  Location: [facility];  Service: General;  Laterality: Bilateral;   TOOTH EXTRACTION     several -molars    Family History  Problem Relation Age of Onset   Heart disease Mother    Dementia Mother        on hospice early 55-died from  COVID-19 in rehab-got sick from nurse   Heart disease Father        died at 77   Stroke Father    Hyperlipidemia Father    Obesity Sister        unknown cause of death- possible cardiac   Drug abuse Sister    Hypertension Sister    Hypertension Brother    Colon cancer Neg Hx     Medications- reviewed and updated Current Outpatient Medications  Medication Sig Dispense Refill   aspirin EC 81 MG tablet Take 81 mg by mouth daily.     atorvastatin  (LIPITOR) 40 MG tablet TAKE 1 TABLET BY MOUTH DAILY 100 tablet 2   empagliflozin  (JARDIANCE ) 10 MG TABS tablet Take 1 tablet (10 mg total) by mouth daily. 90 tablet 3   levothyroxine  (SYNTHROID ) 150 MCG tablet TAKE 1 TABLET BY MOUTH DAILY 100 tablet 2   metFORMIN  (GLUCOPHAGE ) 1000 MG tablet TAKE 1 TABLET BY MOUTH TWICE  DAILY 200 tablet 2   metoprolol  tartrate (LOPRESSOR ) 50 MG tablet Take 1 tablet (50 mg total) by mouth once for 1 dose. PLEASE TAKE METOPROLOL  2  HOURS PRIOR TO CTA SCAN. 1 tablet 0   Multiple Vitamins-Minerals (MENS 50+ MULTI VITAMIN/MIN PO) Take 1 tablet by mouth daily.     pantoprazole  (PROTONIX ) 20 MG tablet TAKE 2 TABLETS BY MOUTH DAILY 200 tablet 2   Vitamin D,  Cholecalciferol, 25 MCG (1000 UT) CAPS Take 1 capsule by mouth daily in the afternoon.     sildenafil  (VIAGRA ) 100 MG tablet Take 1 tablet (100 mg total) by mouth as needed for erectile dysfunction. (Patient not taking: Reported on 05/16/2024) 30 tablet 3   No current facility-administered medications for this visit.    Allergies-reviewed and updated Allergies[1]  Social History   Social History Narrative   Married. 3 children. 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    Prior landscaping as a side business      Hobbies: golf   Objective  Objective:  BP 122/70 (BP Location: Left Arm, Patient Position: Sitting, Cuff Size: Normal)   Pulse 69   Temp 98.6 F (37 C) (Temporal)   Ht 5' 11.5 (1.816 m)    Wt 201 lb 3.2 oz (91.3 kg)   SpO2 93%   BMI 27.67 kg/m  Gen: NAD, resting comfortably HEENT: Mucous membranes are moist. Oropharynx normal Neck: no thyromegaly CV: RRR no murmurs rubs or gallops Lungs: CTAB no crackles, wheeze, rhonchi Abdomen: soft/nontender/nondistended/normal bowel sounds. No rebound or guarding.  Ext: no edema Skin: warm, dry Neuro: grossly normal, moves all extremities, PERRLA    Assessment and Plan  69 y.o. male presenting for annual physical.  Health Maintenance counseling: 1. Anticipatory guidance: Patient counseled regarding regular dental exams - advised q6 months, eye exams - scheduling his yearly,  avoiding smoking and second hand smoke , limiting alcohol to 2 beverages per day - very rare social, no illicit drugs .   2. Risk factor reduction:  Advised patient of need for regular exercise and diet rich and fruits and vegetables to reduce risk of heart attack and stroke.  Exercise- was holding off on getting back to gym for cardiac clearance now plans to return. Does some life cycle bike at the house- feels should increase.  Diet/weight management-up 8 lbs in last year- got as low as 170s last year- was 193 on home scale would like to get back down 5-10 lbs.  Wt Readings from Last 3 Encounters:  05/16/24 201 lb 3.2 oz (91.3 kg)  04/08/24 202 lb 12.8 oz (92 kg)  02/23/24 205 lb 6.4 oz (93.2 kg)  3. Immunizations/screenings/ancillary studies- opts out flu, shingrix, tetanus, Prevnar 20. May do Tetanus, Diphtheria, and Pertussis (Tdap) at pharmacy Immunization History  Administered Date(s) Administered   Influenza Inj Mdck Quad Pf 03/16/2018   Influenza Split 03/06/2011   Influenza, Quadrivalent, Recombinant, Inj, Pf 02/27/2019   Influenza,inj,Quad PF,6+ Mos 04/04/2013, 04/01/2017   Influenza-Unspecified 02/02/2014, 02/27/2015, 02/07/2016, 02/16/2018   Pneumococcal Polysaccharide-23 05/06/2018   Tdap 07/29/2013  4. Prostate cancer screening- low risk  prior trend- update psa today   Lab Results  Component Value Date   PSA 0.95 04/10/2023   PSA 1.46 10/10/2022   PSA 0.95 10/14/2021   5. Colon cancer screening - 04/08/2017 with 10 year repeat 6. Skin cancer screening- follow up with Dr. Towana. advised regular sunscreen use. Denies worrisome, changing, or new skin lesions.  7. Smoking associated screening (lung cancer screening, AAA screen 65-75, UA)- former smoker- - 10/13/22 abdominal aortic aneurysm not formally ruled out- they recommended 3 year repeat due to bowel gas- next year. Enrolled in lung cancer screening.  8. STD screening - only active with wife  Status of chronic or acute concerns   # Diabetes S: Medication: metformin  1g twice daily  -  jardiance  worsened urination but doing ok  CBGs- 123 this morning, 117 yesterday- down slightly from 130s Lab Results  Component Value Date   HGBA1C 7.2 (H) 02/23/2024   HGBA1C 7.3 (H) 11/17/2023   HGBA1C 6.6 (H) 04/10/2023  A/P: hopefully stable- update a1c in 10 days. Continue current meds for now   #hyperlipidemia #aortic atherosclerosis, 3 vessell coronary atherosclerosis per lung cancer screening 07/13/22 S: Medication: atorvastatin  40 mg daily, aspirin 81 mg daily Lab Results  Component Value Date   CHOL 137 11/17/2023   HDL 40.20 11/17/2023   LDLCALC 70 11/17/2023   LDLDIRECT 98.0 05/25/2020   TRIG 134.0 11/17/2023   CHOLHDL 3 11/17/2023  A/P: hopefully stable- update lipid panel today. Continue current meds for now Nonobstructive CAD- continue current medications statin as well as aspirin   #Emphysema-incidental finding on lung cancer screening 07/13/22  -Previously quit smoking in 2014 but restarted after losing sister  before quitting again- has remained off for 13 months!   # SVT - presented to Emergency Department 09/27/22 S:medication: metoprolol  25 mg as needed for rapid heart rate  - not needing A/P: doing well- ccme    #hypothyroidism S: compliant On thyroid   medication- levothyroxine  150 mcg  Lab Results  Component Value Date   TSH 1.81 02/23/2024  A/P:hopefully stable- update TSH with labs. Continue current meds for now    # GERD S:Medication:  pantoprazole  40 mg--> 20 mg but takes 2 A/P: discussed could try just 1 a night unless heavier meal   #erectile dysfunction (ED) - sildenafil  helpful    #back ok lately thankfully  Recommended follow up: Return in about 4 months (around 09/13/2024) for followup or sooner if needed.Schedule b4 you leave.  Lab/Order associations: fasting but will come back   ICD-10-CM   1. Preventative health care  Z00.00     2. Screening for prostate cancer  Z12.5 PSA, Medicare ( [facility] [facility] only)    CANCELED: PSA, Medicare ( [facility] only)    3. Diabetes mellitus without complication (HCC)  E11.9 HgB A1c    CBC with Differential/Platelet    Comprehensive metabolic panel    CANCELED: Comprehensive metabolic panel    CANCELED: CBC with Differential/Platelet    4. Hyperlipidemia associated with type 2 diabetes mellitus (HCC)  E11.69 CBC with Differential/Platelet   E78.5 Lipoprotein A (LPA)    CANCELED: CBC with Differential/Platelet    CANCELED: Lipoprotein A (LPA)    5. Chronic obstructive pulmonary disease, unspecified COPD type (HCC)  J44.9     6. Acquired hypothyroidism  E03.9 TSH      No orders of the defined types were placed in this encounter.   Return precautions advised.  Garnette Lukes, MD      [1] No Known Allergies  "

## 2024-05-16 NOTE — Patient Instructions (Addendum)
 Schedule a lab visit at the check out desk in about 10-14 days. Return for future fasting labs meaning nothing but water  after midnight please. Ok to take your medications with water .  - 22nd or later please for these  Tetanus, Diphtheria, and Pertussis (Tdap) at pharmacy  Recommend follow up with dentist  Glad you are doing well otherwise  Recommended follow up: Return in about 4 months (around 09/13/2024) for followup or sooner if needed.Schedule b4 you leave.

## 2024-05-27 NOTE — Progress Notes (Signed)
 Tracy Turner                                          MRN: [id number]   05/27/2024   The [facility] Quality Team Specialist reviewed this patient medical record for the purposes of chart review for care gap closure. The following were reviewed: abstraction for care gap closure-glycemic status assessment.    [facility]I Quality Team

## 2024-06-09 ENCOUNTER — Other Ambulatory Visit: Payer: Self-pay | Admitting: Cardiovascular Disease
